# Patient Record
Sex: Female | Born: 1955
Health system: Southern US, Community
[De-identification: ages and names within clinical notes are randomized; demographics above are authoritative.]

## PROBLEM LIST (undated history)

## (undated) DIAGNOSIS — M1990 Unspecified osteoarthritis, unspecified site: Secondary | ICD-10-CM

## (undated) DIAGNOSIS — D649 Anemia, unspecified: Secondary | ICD-10-CM

## (undated) DIAGNOSIS — J45909 Unspecified asthma, uncomplicated: Secondary | ICD-10-CM

## (undated) DIAGNOSIS — F329 Major depressive disorder, single episode, unspecified: Secondary | ICD-10-CM

## (undated) DIAGNOSIS — Z972 Presence of dental prosthetic device (complete) (partial): Secondary | ICD-10-CM

## (undated) DIAGNOSIS — F419 Anxiety disorder, unspecified: Secondary | ICD-10-CM

## (undated) DIAGNOSIS — F32A Depression, unspecified: Secondary | ICD-10-CM

## (undated) DIAGNOSIS — H269 Unspecified cataract: Secondary | ICD-10-CM

## (undated) HISTORY — DX: Unspecified asthma, uncomplicated: J45.909

## (undated) HISTORY — PX: ABDOMINAL HYSTERECTOMY: SHX81

## (undated) HISTORY — DX: Depression, unspecified: F32.A

## (undated) HISTORY — PX: CATARACT EXTRACTION, BILATERAL: SHX1313

## (undated) HISTORY — DX: Anxiety disorder, unspecified: F41.9

## (undated) HISTORY — DX: Unspecified cataract: H26.9

## (undated) HISTORY — PX: APPENDECTOMY: SHX54

## (undated) HISTORY — PX: CHOLECYSTECTOMY: SHX55

## (undated) HISTORY — DX: Unspecified osteoarthritis, unspecified site: M19.90

## (undated) HISTORY — DX: Anemia, unspecified: D64.9

## (undated) HISTORY — DX: Major depressive disorder, single episode, unspecified: F32.9

---

## 2005-01-31 ENCOUNTER — Ambulatory Visit: Payer: Self-pay | Admitting: Family Medicine

## 2005-12-13 ENCOUNTER — Emergency Department: Payer: Self-pay | Admitting: Emergency Medicine

## 2006-03-16 ENCOUNTER — Ambulatory Visit: Payer: Self-pay | Admitting: Family Medicine

## 2006-04-05 ENCOUNTER — Ambulatory Visit: Payer: Self-pay | Admitting: Gastroenterology

## 2007-09-03 ENCOUNTER — Emergency Department: Payer: Self-pay | Admitting: Emergency Medicine

## 2008-08-26 DIAGNOSIS — F32A Depression, unspecified: Secondary | ICD-10-CM | POA: Insufficient documentation

## 2008-08-26 DIAGNOSIS — F419 Anxiety disorder, unspecified: Secondary | ICD-10-CM | POA: Insufficient documentation

## 2008-11-05 DIAGNOSIS — G43109 Migraine with aura, not intractable, without status migrainosus: Secondary | ICD-10-CM | POA: Insufficient documentation

## 2008-11-05 DIAGNOSIS — R634 Abnormal weight loss: Secondary | ICD-10-CM | POA: Insufficient documentation

## 2008-11-05 DIAGNOSIS — D649 Anemia, unspecified: Secondary | ICD-10-CM | POA: Insufficient documentation

## 2008-11-05 DIAGNOSIS — N951 Menopausal and female climacteric states: Secondary | ICD-10-CM | POA: Insufficient documentation

## 2011-03-17 DIAGNOSIS — B079 Viral wart, unspecified: Secondary | ICD-10-CM | POA: Diagnosis not present

## 2011-03-17 DIAGNOSIS — L989 Disorder of the skin and subcutaneous tissue, unspecified: Secondary | ICD-10-CM | POA: Diagnosis not present

## 2011-04-12 DIAGNOSIS — Z79899 Other long term (current) drug therapy: Secondary | ICD-10-CM | POA: Diagnosis not present

## 2011-05-16 DIAGNOSIS — G43109 Migraine with aura, not intractable, without status migrainosus: Secondary | ICD-10-CM | POA: Diagnosis not present

## 2011-05-16 DIAGNOSIS — F411 Generalized anxiety disorder: Secondary | ICD-10-CM | POA: Diagnosis not present

## 2011-05-16 DIAGNOSIS — L738 Other specified follicular disorders: Secondary | ICD-10-CM | POA: Diagnosis not present

## 2011-05-16 DIAGNOSIS — D649 Anemia, unspecified: Secondary | ICD-10-CM | POA: Diagnosis not present

## 2011-06-22 DIAGNOSIS — M722 Plantar fascial fibromatosis: Secondary | ICD-10-CM | POA: Diagnosis not present

## 2011-06-22 DIAGNOSIS — M7150 Other bursitis, not elsewhere classified, unspecified site: Secondary | ICD-10-CM | POA: Diagnosis not present

## 2011-07-11 DIAGNOSIS — Z79899 Other long term (current) drug therapy: Secondary | ICD-10-CM | POA: Diagnosis not present

## 2012-02-16 DIAGNOSIS — H25019 Cortical age-related cataract, unspecified eye: Secondary | ICD-10-CM | POA: Diagnosis not present

## 2012-03-26 DIAGNOSIS — F329 Major depressive disorder, single episode, unspecified: Secondary | ICD-10-CM | POA: Diagnosis not present

## 2012-03-26 DIAGNOSIS — D649 Anemia, unspecified: Secondary | ICD-10-CM | POA: Diagnosis not present

## 2012-03-26 DIAGNOSIS — N898 Other specified noninflammatory disorders of vagina: Secondary | ICD-10-CM | POA: Diagnosis not present

## 2012-03-26 DIAGNOSIS — Z0000 Encounter for general adult medical examination without abnormal findings: Secondary | ICD-10-CM | POA: Diagnosis not present

## 2012-03-26 DIAGNOSIS — R319 Hematuria, unspecified: Secondary | ICD-10-CM | POA: Diagnosis not present

## 2012-03-27 DIAGNOSIS — F329 Major depressive disorder, single episode, unspecified: Secondary | ICD-10-CM | POA: Diagnosis not present

## 2012-03-27 DIAGNOSIS — D649 Anemia, unspecified: Secondary | ICD-10-CM | POA: Diagnosis not present

## 2012-03-27 DIAGNOSIS — Z0000 Encounter for general adult medical examination without abnormal findings: Secondary | ICD-10-CM | POA: Diagnosis not present

## 2012-03-27 DIAGNOSIS — Z136 Encounter for screening for cardiovascular disorders: Secondary | ICD-10-CM | POA: Diagnosis not present

## 2012-04-12 DIAGNOSIS — D649 Anemia, unspecified: Secondary | ICD-10-CM | POA: Diagnosis not present

## 2012-04-16 DIAGNOSIS — N9089 Other specified noninflammatory disorders of vulva and perineum: Secondary | ICD-10-CM | POA: Diagnosis not present

## 2012-05-14 ENCOUNTER — Ambulatory Visit: Payer: Self-pay | Admitting: Family Medicine

## 2012-05-14 DIAGNOSIS — Z1231 Encounter for screening mammogram for malignant neoplasm of breast: Secondary | ICD-10-CM | POA: Diagnosis not present

## 2012-05-17 DIAGNOSIS — D649 Anemia, unspecified: Secondary | ICD-10-CM | POA: Diagnosis not present

## 2012-05-23 DIAGNOSIS — F329 Major depressive disorder, single episode, unspecified: Secondary | ICD-10-CM | POA: Diagnosis not present

## 2012-05-23 DIAGNOSIS — D649 Anemia, unspecified: Secondary | ICD-10-CM | POA: Diagnosis not present

## 2012-05-23 DIAGNOSIS — N898 Other specified noninflammatory disorders of vagina: Secondary | ICD-10-CM | POA: Diagnosis not present

## 2012-05-23 DIAGNOSIS — Z0000 Encounter for general adult medical examination without abnormal findings: Secondary | ICD-10-CM | POA: Diagnosis not present

## 2012-06-08 DIAGNOSIS — F332 Major depressive disorder, recurrent severe without psychotic features: Secondary | ICD-10-CM | POA: Diagnosis not present

## 2012-06-08 DIAGNOSIS — F4310 Post-traumatic stress disorder, unspecified: Secondary | ICD-10-CM | POA: Diagnosis not present

## 2012-09-04 DIAGNOSIS — F4310 Post-traumatic stress disorder, unspecified: Secondary | ICD-10-CM | POA: Diagnosis not present

## 2012-09-04 DIAGNOSIS — Z79899 Other long term (current) drug therapy: Secondary | ICD-10-CM | POA: Diagnosis not present

## 2012-09-04 DIAGNOSIS — F319 Bipolar disorder, unspecified: Secondary | ICD-10-CM | POA: Diagnosis not present

## 2012-12-18 DIAGNOSIS — F319 Bipolar disorder, unspecified: Secondary | ICD-10-CM | POA: Diagnosis not present

## 2012-12-18 DIAGNOSIS — F4310 Post-traumatic stress disorder, unspecified: Secondary | ICD-10-CM | POA: Diagnosis not present

## 2013-02-25 DIAGNOSIS — F4310 Post-traumatic stress disorder, unspecified: Secondary | ICD-10-CM | POA: Diagnosis not present

## 2013-04-12 DIAGNOSIS — N951 Menopausal and female climacteric states: Secondary | ICD-10-CM | POA: Diagnosis not present

## 2013-04-12 DIAGNOSIS — D649 Anemia, unspecified: Secondary | ICD-10-CM | POA: Diagnosis not present

## 2013-04-12 DIAGNOSIS — Z0000 Encounter for general adult medical examination without abnormal findings: Secondary | ICD-10-CM | POA: Diagnosis not present

## 2013-04-12 DIAGNOSIS — D239 Other benign neoplasm of skin, unspecified: Secondary | ICD-10-CM | POA: Diagnosis not present

## 2013-04-12 DIAGNOSIS — G4700 Insomnia, unspecified: Secondary | ICD-10-CM | POA: Diagnosis not present

## 2013-05-15 ENCOUNTER — Ambulatory Visit: Payer: Self-pay | Admitting: Family Medicine

## 2013-05-15 DIAGNOSIS — D485 Neoplasm of uncertain behavior of skin: Secondary | ICD-10-CM | POA: Diagnosis not present

## 2013-05-15 DIAGNOSIS — L820 Inflamed seborrheic keratosis: Secondary | ICD-10-CM | POA: Diagnosis not present

## 2013-05-15 DIAGNOSIS — D2360 Other benign neoplasm of skin of unspecified upper limb, including shoulder: Secondary | ICD-10-CM | POA: Diagnosis not present

## 2013-05-15 DIAGNOSIS — Z1231 Encounter for screening mammogram for malignant neoplasm of breast: Secondary | ICD-10-CM | POA: Diagnosis not present

## 2013-06-17 DIAGNOSIS — F4310 Post-traumatic stress disorder, unspecified: Secondary | ICD-10-CM | POA: Diagnosis not present

## 2013-06-17 DIAGNOSIS — F332 Major depressive disorder, recurrent severe without psychotic features: Secondary | ICD-10-CM | POA: Diagnosis not present

## 2013-07-01 DIAGNOSIS — M109 Gout, unspecified: Secondary | ICD-10-CM | POA: Diagnosis not present

## 2013-08-20 DIAGNOSIS — B354 Tinea corporis: Secondary | ICD-10-CM | POA: Diagnosis not present

## 2013-10-17 DIAGNOSIS — M79609 Pain in unspecified limb: Secondary | ICD-10-CM | POA: Diagnosis not present

## 2013-10-17 DIAGNOSIS — IMO0002 Reserved for concepts with insufficient information to code with codable children: Secondary | ICD-10-CM | POA: Diagnosis not present

## 2013-10-17 DIAGNOSIS — M5116 Intervertebral disc disorders with radiculopathy, lumbar region: Secondary | ICD-10-CM | POA: Insufficient documentation

## 2013-12-06 DIAGNOSIS — J069 Acute upper respiratory infection, unspecified: Secondary | ICD-10-CM | POA: Diagnosis not present

## 2014-01-13 DIAGNOSIS — F4310 Post-traumatic stress disorder, unspecified: Secondary | ICD-10-CM | POA: Diagnosis not present

## 2014-03-31 DIAGNOSIS — J019 Acute sinusitis, unspecified: Secondary | ICD-10-CM | POA: Diagnosis not present

## 2014-03-31 DIAGNOSIS — J40 Bronchitis, not specified as acute or chronic: Secondary | ICD-10-CM | POA: Diagnosis not present

## 2014-04-04 DIAGNOSIS — F4310 Post-traumatic stress disorder, unspecified: Secondary | ICD-10-CM | POA: Diagnosis not present

## 2014-04-04 DIAGNOSIS — F419 Anxiety disorder, unspecified: Secondary | ICD-10-CM | POA: Diagnosis not present

## 2014-04-04 DIAGNOSIS — F332 Major depressive disorder, recurrent severe without psychotic features: Secondary | ICD-10-CM | POA: Diagnosis not present

## 2014-04-28 DIAGNOSIS — Z0000 Encounter for general adult medical examination without abnormal findings: Secondary | ICD-10-CM | POA: Diagnosis not present

## 2014-04-28 DIAGNOSIS — M1990 Unspecified osteoarthritis, unspecified site: Secondary | ICD-10-CM | POA: Diagnosis not present

## 2014-04-28 DIAGNOSIS — R252 Cramp and spasm: Secondary | ICD-10-CM | POA: Diagnosis not present

## 2014-04-29 DIAGNOSIS — R252 Cramp and spasm: Secondary | ICD-10-CM | POA: Diagnosis not present

## 2014-04-29 DIAGNOSIS — Z0000 Encounter for general adult medical examination without abnormal findings: Secondary | ICD-10-CM | POA: Diagnosis not present

## 2014-04-29 DIAGNOSIS — F419 Anxiety disorder, unspecified: Secondary | ICD-10-CM | POA: Diagnosis not present

## 2014-04-29 LAB — BASIC METABOLIC PANEL
BUN: 15 mg/dL (ref 4–21)
CREATININE: 0.9 mg/dL (ref 0.5–1.1)
Glucose: 83 mg/dL
POTASSIUM: 4.4 mmol/L (ref 3.4–5.3)
SODIUM: 139 mmol/L (ref 137–147)

## 2014-04-29 LAB — HEPATIC FUNCTION PANEL
ALT: 10 U/L (ref 7–35)
AST: 19 U/L (ref 13–35)

## 2014-04-29 LAB — CBC AND DIFFERENTIAL
HCT: 34 % — AB (ref 36–46)
Hemoglobin: 11.1 g/dL — AB (ref 12.0–16.0)
PLATELETS: 325 10*3/uL (ref 150–399)
WBC: 6.4 10^3/mL

## 2014-04-29 LAB — LIPID PANEL
CHOLESTEROL: 182 mg/dL (ref 0–200)
HDL: 68 mg/dL (ref 35–70)
LDL Cholesterol: 104 mg/dL
Triglycerides: 49 mg/dL (ref 40–160)

## 2014-04-29 LAB — TSH: TSH: 0.73 u[IU]/mL (ref 0.41–5.90)

## 2014-05-19 ENCOUNTER — Ambulatory Visit: Payer: Self-pay | Admitting: Family Medicine

## 2014-05-19 DIAGNOSIS — Z1231 Encounter for screening mammogram for malignant neoplasm of breast: Secondary | ICD-10-CM | POA: Diagnosis not present

## 2014-05-28 DIAGNOSIS — M5431 Sciatica, right side: Secondary | ICD-10-CM | POA: Diagnosis not present

## 2014-06-23 DIAGNOSIS — F4310 Post-traumatic stress disorder, unspecified: Secondary | ICD-10-CM | POA: Diagnosis not present

## 2014-08-08 ENCOUNTER — Other Ambulatory Visit: Payer: Self-pay | Admitting: Family Medicine

## 2014-08-08 MED ORDER — HYDROCODONE-ACETAMINOPHEN 10-325 MG PO TABS: 1.0000 | ORAL_TABLET | Freq: Four times a day (QID) | ORAL | Status: DC | PRN

## 2014-08-08 NOTE — Telephone Encounter (Signed)
Prescription printed. Please notify patient it is ready for pick up. Thanks- Dr. Trinka Keshishyan.  

## 2014-08-08 NOTE — Telephone Encounter (Signed)
Pt contacted office for refill request on the following medications: Hydrocodone 10-325 mg. Thanks TNP  °

## 2014-08-08 NOTE — Telephone Encounter (Signed)
Left message advising pt.   Thanks,   -Laura  

## 2014-08-11 ENCOUNTER — Other Ambulatory Visit: Payer: Self-pay | Admitting: Family Medicine

## 2014-08-11 MED ORDER — HYDROCODONE-ACETAMINOPHEN 10-325 MG PO TABS: 1.0000 | ORAL_TABLET | Freq: Four times a day (QID) | ORAL | Status: DC | PRN

## 2014-09-22 DIAGNOSIS — F4310 Post-traumatic stress disorder, unspecified: Secondary | ICD-10-CM | POA: Diagnosis not present

## 2014-10-06 DIAGNOSIS — M7670 Peroneal tendinitis, unspecified leg: Secondary | ICD-10-CM | POA: Insufficient documentation

## 2014-10-06 DIAGNOSIS — M7671 Peroneal tendinitis, right leg: Secondary | ICD-10-CM | POA: Diagnosis not present

## 2014-11-13 ENCOUNTER — Ambulatory Visit (INDEPENDENT_AMBULATORY_CARE_PROVIDER_SITE_OTHER): Payer: Medicare Other | Admitting: Family Medicine

## 2014-11-13 ENCOUNTER — Encounter: Payer: Self-pay | Admitting: Family Medicine

## 2014-11-13 VITALS — BP 106/66 | HR 56 | Temp 98.7°F | Resp 16 | Ht 65.0 in | Wt 146.0 lb

## 2014-11-13 DIAGNOSIS — R252 Cramp and spasm: Secondary | ICD-10-CM | POA: Insufficient documentation

## 2014-11-13 DIAGNOSIS — M1990 Unspecified osteoarthritis, unspecified site: Secondary | ICD-10-CM | POA: Insufficient documentation

## 2014-11-13 DIAGNOSIS — T63441A Toxic effect of venom of bees, accidental (unintentional), initial encounter: Secondary | ICD-10-CM | POA: Insufficient documentation

## 2014-11-13 DIAGNOSIS — J45909 Unspecified asthma, uncomplicated: Secondary | ICD-10-CM | POA: Insufficient documentation

## 2014-11-13 DIAGNOSIS — L739 Follicular disorder, unspecified: Secondary | ICD-10-CM | POA: Insufficient documentation

## 2014-11-13 DIAGNOSIS — D229 Melanocytic nevi, unspecified: Secondary | ICD-10-CM | POA: Insufficient documentation

## 2014-11-13 DIAGNOSIS — R319 Hematuria, unspecified: Secondary | ICD-10-CM | POA: Insufficient documentation

## 2014-11-13 MED ORDER — CETIRIZINE HCL 10 MG PO TABS: 10.0000 mg | ORAL_TABLET | Freq: Every day | ORAL | Status: DC

## 2014-11-13 MED ORDER — RANITIDINE HCL 150 MG PO CAPS: 150.0000 mg | ORAL_CAPSULE | Freq: Two times a day (BID) | ORAL | Status: DC

## 2014-11-13 NOTE — Progress Notes (Signed)
        Patient: Patricia Decker Female    DOB: 06-Apr-1955   59 y.o.   MRN: [id number] Visit Date: 11/13/2014  Today's Provider: Margarita Rana, MD   Chief Complaint  Patient presents with  . Insect Bite   Subjective:    HPI Rash: Patient complains of rash and swelling involving the right arm. Rash started today . Appearance of rash at onset: Other appearance: swollen.  Discomfort associated with rash: is painful.  Associated symptoms: none. Denies: shortness of breath. Patient reports bee sting today around 1 pm. Patient reports that she has taken a dose of benadryl no improvement for her swelling.   Has always had an allergy to bees. Has not been stung in a while.  Does not hurt unless she touches it.       Allergies  Allergen Reactions  . Penicillins   . Sulfa Antibiotics     Indigestion   Previous Medications   ALPRAZOLAM (XANAX) 1 MG TABLET    Take 1 tablet by mouth 3 (three) times daily.   ARIPIPRAZOLE PO    Take 1 tablet by mouth daily.    ESTRADIOL (ESTRACE) 1 MG TABLET    Take 1 tablet by mouth daily.   HYDROCODONE-ACETAMINOPHEN (NORCO) 10-325 MG PER TABLET    Take 1 tablet by mouth every 6 (six) hours as needed. To be filled after 10/11/2014   VENLAFAXINE XR (EFFEXOR XR) 150 MG 24 HR CAPSULE    Take 2 capsules by mouth daily.     Review of Systems  Constitutional: Negative.   HENT: Negative.   Respiratory: Negative.   Skin: Rash: swelling.    Social History  Substance Use Topics  . Smoking status: Never Smoker   . Smokeless tobacco: Never Used  . Alcohol Use: No   Objective:   BP 106/66 mmHg  Pulse 56  Temp(Src) 98.7 F (37.1 C) (Oral)  Resp 16  Ht 5\' 5"  (1.651 m)  Wt 146 lb (66.225 kg)  BMI 24.30 kg/m2  SpO2 99%  Physical Exam  Constitutional: She is oriented to person, place, and time. She appears well-developed and well-nourished.  Musculoskeletal: She exhibits edema.  Marked edema in right hand from thumb to MCPs and over dorsal surface of hand.  Tender to deep palpation. Slightly decreased ability to make fist secondary to swelling.    Neurological: She is alert and oriented to person, place, and time.  Skin: Skin is warm and dry.      Assessment & Plan:     1. Bee sting reaction, accidental or unintentional, initial encounter Stabilized since taking Benadryl. Will add medication as noted. Patient instructed to call back if condition worsens or does not improve.    - cetirizine (ZYRTEC) 10 MG tablet; Take 1 tablet (10 mg total) by mouth daily.  Dispense: 30 tablet; Refill: 11 - ranitidine (ZANTAC) 150 MG capsule; Take 1 capsule (150 mg total) by mouth 2 (two) times daily.  Dispense: 60 capsule; Refill: 0  Margarita Rana, MD       Margarita Rana, MD  [facility]

## 2014-11-28 ENCOUNTER — Other Ambulatory Visit: Payer: Self-pay | Admitting: Family Medicine

## 2014-11-28 DIAGNOSIS — M1990 Unspecified osteoarthritis, unspecified site: Secondary | ICD-10-CM

## 2014-11-28 MED ORDER — HYDROCODONE-ACETAMINOPHEN 10-325 MG PO TABS: 1.0000 | ORAL_TABLET | Freq: Four times a day (QID) | ORAL | Status: DC | PRN

## 2014-11-28 NOTE — Telephone Encounter (Signed)
Pt contacted office for refill request on the following medications: HYDROcodone-acetaminophen (NORCO) 10-325 MG per tablet. Pt stated she has 4 left and she usually gets 3 scripts at a time. Pt stated she would like to get this Monday 12/01/14. Thanks TNP

## 2014-12-30 ENCOUNTER — Other Ambulatory Visit: Payer: Self-pay

## 2014-12-30 DIAGNOSIS — M1990 Unspecified osteoarthritis, unspecified site: Secondary | ICD-10-CM

## 2014-12-30 MED ORDER — HYDROCODONE-ACETAMINOPHEN 10-325 MG PO TABS: 1.0000 | ORAL_TABLET | Freq: Four times a day (QID) | ORAL | Status: DC | PRN

## 2014-12-30 NOTE — Telephone Encounter (Signed)
Pt called and states she needs refill for norco that she only got 1 RX last time and she has 4 tablets left. Thanks-aa

## 2014-12-30 NOTE — Telephone Encounter (Signed)
Prescription printed. Please notify patient it is ready for pick up. Thanks- Dr. Arvine Clayburn.  

## 2015-01-19 DIAGNOSIS — F4310 Post-traumatic stress disorder, unspecified: Secondary | ICD-10-CM | POA: Diagnosis not present

## 2015-01-30 ENCOUNTER — Other Ambulatory Visit: Payer: Self-pay | Admitting: Family Medicine

## 2015-01-30 DIAGNOSIS — M1990 Unspecified osteoarthritis, unspecified site: Secondary | ICD-10-CM

## 2015-01-30 NOTE — Telephone Encounter (Signed)
Pt called for refill on her HYDROcodone-acetaminophen (NORCO) 10-325 MG tablet  Call back [phone]   Thanks teri

## 2015-02-02 ENCOUNTER — Other Ambulatory Visit: Payer: Self-pay | Admitting: Family Medicine

## 2015-02-02 DIAGNOSIS — M1990 Unspecified osteoarthritis, unspecified site: Secondary | ICD-10-CM

## 2015-02-02 MED ORDER — HYDROCODONE-ACETAMINOPHEN 10-325 MG PO TABS: 1.0000 | ORAL_TABLET | Freq: Four times a day (QID) | ORAL | Status: DC | PRN

## 2015-02-25 ENCOUNTER — Other Ambulatory Visit: Payer: Self-pay | Admitting: Family Medicine

## 2015-02-25 DIAGNOSIS — M1990 Unspecified osteoarthritis, unspecified site: Secondary | ICD-10-CM

## 2015-02-25 NOTE — Telephone Encounter (Signed)
Prescription printed. Please notify patient it is ready for pick up. Thanks- Dr. Jkayla Spiewak.  

## 2015-03-31 ENCOUNTER — Other Ambulatory Visit: Payer: Self-pay | Admitting: Family Medicine

## 2015-03-31 DIAGNOSIS — M1990 Unspecified osteoarthritis, unspecified site: Secondary | ICD-10-CM

## 2015-03-31 MED ORDER — HYDROCODONE-ACETAMINOPHEN 10-325 MG PO TABS: 1.0000 | ORAL_TABLET | Freq: Four times a day (QID) | ORAL | Status: DC | PRN

## 2015-03-31 NOTE — Telephone Encounter (Signed)
Pt contacted office for refill request on the following medications:  HYDROcodone-acetaminophen (NORCO) 10-325 MG tablet.  CB#[phone]/MW °

## 2015-05-01 ENCOUNTER — Other Ambulatory Visit: Payer: Self-pay | Admitting: Family Medicine

## 2015-05-01 DIAGNOSIS — M1990 Unspecified osteoarthritis, unspecified site: Secondary | ICD-10-CM

## 2015-05-01 MED ORDER — HYDROCODONE-ACETAMINOPHEN 10-325 MG PO TABS: 1.0000 | ORAL_TABLET | Freq: Four times a day (QID) | ORAL | Status: DC | PRN

## 2015-05-01 NOTE — Telephone Encounter (Signed)
Prescription printed. Please notify patient it is ready for pick up.  Make sure keeps appointment in April.  Thanks- Dr. Venia Minks.

## 2015-05-01 NOTE — Telephone Encounter (Signed)
.  Pt contacted office for refill request on the following medications:  HYDROcodone-acetaminophen (NORCO) 10-325 MG tablet.  CB#[phone]/MW

## 2015-05-05 ENCOUNTER — Encounter: Payer: Self-pay | Admitting: Family Medicine

## 2015-06-01 ENCOUNTER — Other Ambulatory Visit: Payer: Self-pay | Admitting: Family Medicine

## 2015-06-01 DIAGNOSIS — M1990 Unspecified osteoarthritis, unspecified site: Secondary | ICD-10-CM

## 2015-06-01 MED ORDER — HYDROCODONE-ACETAMINOPHEN 10-325 MG PO TABS: 1.0000 | ORAL_TABLET | Freq: Four times a day (QID) | ORAL | Status: DC | PRN

## 2015-06-01 NOTE — Telephone Encounter (Signed)
Pt contacted office for refill request on the following medications:  HYDROcodone-acetaminophen (NORCO) 10-325 MG tablet.  CB#[phone]/MW   This is a Dr Venia Minks pt.

## 2015-06-01 NOTE — Telephone Encounter (Signed)
I am not in office this week. Need provider in office to fill. Thanks.

## 2015-06-02 NOTE — Telephone Encounter (Signed)
Is this okay for you to fill? Renaldo Fiddler, CMA

## 2015-06-11 ENCOUNTER — Ambulatory Visit (INDEPENDENT_AMBULATORY_CARE_PROVIDER_SITE_OTHER): Payer: Medicare Other | Admitting: Family Medicine

## 2015-06-11 ENCOUNTER — Encounter: Payer: Self-pay | Admitting: Family Medicine

## 2015-06-11 VITALS — BP 140/80 | HR 66 | Temp 98.1°F | Resp 16 | Ht 65.0 in | Wt 142.0 lb

## 2015-06-11 DIAGNOSIS — Z124 Encounter for screening for malignant neoplasm of cervix: Secondary | ICD-10-CM

## 2015-06-11 DIAGNOSIS — Z0000 Encounter for general adult medical examination without abnormal findings: Secondary | ICD-10-CM | POA: Diagnosis not present

## 2015-06-11 DIAGNOSIS — Z1231 Encounter for screening mammogram for malignant neoplasm of breast: Secondary | ICD-10-CM | POA: Diagnosis not present

## 2015-06-11 DIAGNOSIS — R634 Abnormal weight loss: Secondary | ICD-10-CM

## 2015-06-11 DIAGNOSIS — F329 Major depressive disorder, single episode, unspecified: Secondary | ICD-10-CM | POA: Diagnosis not present

## 2015-06-11 DIAGNOSIS — D508 Other iron deficiency anemias: Secondary | ICD-10-CM

## 2015-06-11 DIAGNOSIS — F32A Depression, unspecified: Secondary | ICD-10-CM

## 2015-06-11 DIAGNOSIS — F419 Anxiety disorder, unspecified: Secondary | ICD-10-CM | POA: Diagnosis not present

## 2015-06-11 DIAGNOSIS — Z1211 Encounter for screening for malignant neoplasm of colon: Secondary | ICD-10-CM

## 2015-06-11 LAB — IFOBT (OCCULT BLOOD): IFOBT: NEGATIVE

## 2015-06-11 NOTE — Progress Notes (Signed)
Patient ID: [id number] HRUBES, female   DOB: [birth year], 60 y.o.   MRN: [id number]       Patient: Patricia Decker, Female    DOB: [birth year], 60 y.o.   MRN: [id number] Visit Date: 06/11/2015  Today's Provider: Margarita Rana, MD   Chief Complaint  Patient presents with  . Medicare Wellness   Subjective:    Annual wellness visit Patricia Decker is a 60 y.o. female. She feels well. She reports exercising active with daily activities. She reports she is sleeping fairly well.  04/28/14 CPE 05/19/14 Mammogram-BI-RADS 1 04/05/06 Colonoscopy-normal  -----------------------------------------------------------   Review of Systems  Constitutional: Negative.   Eyes: Negative.   Respiratory: Negative.   Cardiovascular: Negative.   Gastrointestinal: Negative.   Endocrine: Negative.   Genitourinary: Negative.   Musculoskeletal: Positive for arthralgias.  Skin: Negative.   Allergic/Immunologic: Positive for environmental allergies.  Neurological: Positive for headaches.  Hematological: Negative.   Psychiatric/Behavioral: The patient is nervous/anxious.     Social History   Social History  . Marital Status: Widowed    Spouse Name: N/A  . Number of Children: N/A  . Years of Education: N/A   Occupational History  . Not on file.   Social History Main Topics  . Smoking status: Never Smoker   . Smokeless tobacco: Never Used  . Alcohol Use: No  . Drug Use: No  . Sexual Activity: Not on file   Other Topics Concern  . Not on file   Social History Narrative    Past Medical History  Diagnosis Date  . Anxiety   . Depression   . Arthritis   . Asthma   . Anemia      Patient Active Problem List   Diagnosis Date Noted  . Arthritis 11/13/2014  . Airway hyperreactivity 11/13/2014  . Atypical nevus 11/13/2014  . Folliculitis [id number]  . Blood in the urine 11/13/2014  . Cramp in muscle 11/13/2014  . Bee sting reaction 11/13/2014  . Neuritis or radiculitis due to rupture  of lumbar intervertebral disc 10/17/2013  . Abnormal loss of weight 11/05/2008  . Absolute anemia 11/05/2008  . Menopausal symptom 11/05/2008  . Migraine aura without headache 11/05/2008  . Anxiety 08/26/2008  . Clinical depression 08/26/2008    Past Surgical History  Procedure Laterality Date  . Cholecystectomy    . Abdominal hysterectomy    . Appendectomy      Her family history includes Alzheimer's disease in her sister.    Previous Medications   ALPRAZOLAM (XANAX) 1 MG TABLET    Take 1 tablet by mouth 3 (three) times daily.   ARIPIPRAZOLE PO    Take 1 tablet by mouth daily.    ESTRADIOL (ESTRACE) 1 MG TABLET    Take 1 tablet by mouth daily.   HYDROCODONE-ACETAMINOPHEN (NORCO) 10-325 MG TABLET    Take 1 tablet by mouth every 6 (six) hours as needed.   VENLAFAXINE XR (EFFEXOR XR) 150 MG 24 HR CAPSULE    Take 2 capsules by mouth daily.     Patient Care Team: Margarita Rana, MD as PCP - General (Family Medicine)     Objective:   Vitals: BP 140/80 mmHg  Pulse 66  Temp(Src) 98.1 F (36.7 C) (Oral)  Resp 16  Ht 5\' 5"  (1.651 m)  Wt 142 lb (64.411 kg)  BMI 23.63 kg/m2  SpO2 99%  Physical Exam  Constitutional: She is oriented to person, place, and time. She appears well-developed and well-nourished.  HENT:  Head: Normocephalic  and atraumatic.  Right Ear: Tympanic membrane, external ear and ear canal normal.  Left Ear: Tympanic membrane, external ear and ear canal normal.  Nose: Nose normal.  Mouth/Throat: Uvula is midline, oropharynx is clear and moist and mucous membranes are normal.  Eyes: Conjunctivae, EOM and lids are normal. Pupils are equal, round, and reactive to light.  Neck: Trachea normal and normal range of motion. Neck supple. Carotid bruit is not present. No thyroid mass and no thyromegaly present.  Cardiovascular: Normal rate, regular rhythm and normal heart sounds.   Pulmonary/Chest: Effort normal and breath sounds normal.  Abdominal: Soft. Normal  appearance and bowel sounds are normal. There is no hepatosplenomegaly. There is no tenderness.  Genitourinary: Rectum normal and vagina normal. No breast swelling, tenderness or discharge.  Musculoskeletal: Normal range of motion.  Lymphadenopathy:    She has no cervical adenopathy.    She has no axillary adenopathy.  Neurological: She is alert and oriented to person, place, and time. She has normal strength. No cranial nerve deficit.  Skin: Skin is warm, dry and intact.  Psychiatric: She has a normal mood and affect. Her speech is normal and behavior is normal. Judgment and thought content normal. Cognition and memory are normal.    Activities of Daily Living In your present state of health, do you have any difficulty performing the following activities: 06/11/2015  Hearing? N  Vision? N  Difficulty concentrating or making decisions? N  Walking or climbing stairs? N  Dressing or bathing? N  Doing errands, shopping? N    Fall Risk Assessment Fall Risk  06/11/2015  Falls in the past year? No     Depression Screen PHQ 2/9 Scores 06/11/2015  PHQ - 2 Score 1    Cognitive Testing - 6-CIT  Correct? Score   What year is it? yes 0 0 or 4  What month is it? yes 0 0 or 3  Memorize:    Pia Mau,  44,  [address]  [place],      What time is it? (within 1 hour) yes 0 0 or 3  Count backwards from 20 yes 0 0, 2, or 4  Name the months of the year yes 0 0, 2, or 4  Repeat name & address above no 2 0, 2, 4, 6, 8, or 10       TOTAL SCORE  2/28   Interpretation:  Normal  Normal (0-7) Abnormal (8-28)       Assessment & Plan:     Annual Wellness Visit  Reviewed patient's Family Medical History Reviewed and updated list of patient's medical providers Assessment of cognitive impairment was done Assessed patient's functional ability Established a written schedule for health screening [place] Completed and Reviewed  Exercise Activities and Dietary  recommendations Goals    None       There is no immunization history on file for this patient.      1. Medicare annual wellness visit, subsequent Stable. Patient advised to continue eating healthy and exercise daily.  2. Cervical cancer screening F/U pending lab report. - Pap IG (Image Guided)  3. Colon cancer screening negative - IFOBT POC (occult bld, rslt in office)  4. Encounter for screening mammogram for breast cancer - MM DIGITAL SCREENING BILATERAL; Future  5. Other iron deficiency anemias - CBC with Differential/Platelet  6. Anxiety - TSH  7. Abnormal loss of weight - Comprehensive metabolic panel   Patient seen and examined by Dr. Candiss Norse.Marland Kitchen  Venia Minks, and note scribed by Philbert Riser. Dimas, CMA.  I have reviewed the document for accuracy and completeness and I agree with above. Jerrell Belfast, MD   Margarita Rana, MD   ------------------------------------------------------------------------------------------------------------

## 2015-06-12 DIAGNOSIS — F419 Anxiety disorder, unspecified: Secondary | ICD-10-CM | POA: Diagnosis not present

## 2015-06-12 DIAGNOSIS — R634 Abnormal weight loss: Secondary | ICD-10-CM | POA: Diagnosis not present

## 2015-06-12 DIAGNOSIS — D508 Other iron deficiency anemias: Secondary | ICD-10-CM | POA: Diagnosis not present

## 2015-06-13 LAB — CBC WITH DIFFERENTIAL/PLATELET
BASOS ABS: 0 10*3/uL (ref 0.0–0.2)
Basos: 1 %
EOS (ABSOLUTE): 0.1 10*3/uL (ref 0.0–0.4)
EOS: 2 %
HEMATOCRIT: 34.2 % (ref 34.0–46.6)
HEMOGLOBIN: 11 g/dL — AB (ref 11.1–15.9)
IMMATURE GRANULOCYTES: 0 %
Immature Grans (Abs): 0 10*3/uL (ref 0.0–0.1)
LYMPHS ABS: 2.8 10*3/uL (ref 0.7–3.1)
LYMPHS: 43 %
MCH: 27.9 pg (ref 26.6–33.0)
MCHC: 32.2 g/dL (ref 31.5–35.7)
MCV: 87 fL (ref 79–97)
MONOCYTES: 7 %
Monocytes Absolute: 0.4 10*3/uL (ref 0.1–0.9)
NEUTROS PCT: 47 %
Neutrophils Absolute: 3.1 10*3/uL (ref 1.4–7.0)
Platelets: 352 10*3/uL (ref 150–379)
RBC: 3.94 x10E6/uL (ref 3.77–5.28)
RDW: 15.5 % — AB (ref 12.3–15.4)
WBC: 6.4 10*3/uL (ref 3.4–10.8)

## 2015-06-13 LAB — COMPREHENSIVE METABOLIC PANEL
ALBUMIN: 4.1 g/dL (ref 3.5–5.5)
ALK PHOS: 61 IU/L (ref 39–117)
ALT: 8 IU/L (ref 0–32)
AST: 14 IU/L (ref 0–40)
Albumin/Globulin Ratio: 1.7 (ref 1.2–2.2)
BUN/Creatinine Ratio: 16 (ref 9–23)
BUN: 12 mg/dL (ref 6–24)
Bilirubin Total: <0.2 mg/dL (ref 0.0–1.2)
CALCIUM: 9.2 mg/dL (ref 8.7–10.2)
CO2: 25 mmol/L (ref 18–29)
CREATININE: 0.74 mg/dL (ref 0.57–1.00)
Chloride: 104 mmol/L (ref 96–106)
GFR calc Af Amer: 103 mL/min/{1.73_m2} (ref >59)
GFR, EST NON AFRICAN AMERICAN: 90 mL/min/{1.73_m2} (ref >59)
GLOBULIN, TOTAL: 2.4 g/dL (ref 1.5–4.5)
GLUCOSE: 94 mg/dL (ref 65–99)
Potassium: 4.7 mmol/L (ref 3.5–5.2)
Sodium: 144 mmol/L (ref 134–144)
Total Protein: 6.5 g/dL (ref 6.0–8.5)

## 2015-06-13 LAB — TSH: TSH: 0.889 u[IU]/mL (ref 0.450–4.500)

## 2015-06-15 ENCOUNTER — Telehealth: Payer: Self-pay

## 2015-06-15 NOTE — Telephone Encounter (Signed)
-----   Message from Margarita Rana, MD sent at 06/13/2015  2:13 PM EDT ----- Labs stable. Please notify patient. Thanks.

## 2015-06-15 NOTE — Telephone Encounter (Signed)
Patient advised as below.  

## 2015-06-17 ENCOUNTER — Telehealth: Payer: Self-pay

## 2015-06-17 LAB — PAP IG (IMAGE GUIDED): PAP SMEAR COMMENT: 0

## 2015-06-17 NOTE — Telephone Encounter (Signed)
LMTCB Javonta Gronau Drozdowski, CMA  

## 2015-06-17 NOTE — Telephone Encounter (Signed)
-----   Message from Margarita Rana, MD sent at 06/17/2015 10:13 AM EDT ----- Pap is normal. Please notify patient.   Thanks.

## 2015-06-17 NOTE — Telephone Encounter (Signed)
Advised pt of lab results. Pt verbally acknowledges understanding. Moorea Boissonneault Drozdowski, CMA   

## 2015-06-24 ENCOUNTER — Ambulatory Visit
Admission: RE | Admit: 2015-06-24 | Discharge: 2015-06-24 | Disposition: A | Discharge status: Home / Self Care | Payer: Medicare Other | Source: Ambulatory Visit | Attending: Family Medicine | Admitting: Family Medicine

## 2015-06-24 ENCOUNTER — Other Ambulatory Visit: Payer: Self-pay | Admitting: Family Medicine

## 2015-06-24 DIAGNOSIS — Z1231 Encounter for screening mammogram for malignant neoplasm of breast: Secondary | ICD-10-CM | POA: Insufficient documentation

## 2015-06-29 ENCOUNTER — Other Ambulatory Visit: Payer: Self-pay | Admitting: Family Medicine

## 2015-06-29 DIAGNOSIS — M1990 Unspecified osteoarthritis, unspecified site: Secondary | ICD-10-CM

## 2015-06-29 MED ORDER — HYDROCODONE-ACETAMINOPHEN 10-325 MG PO TABS: 1.0000 | ORAL_TABLET | Freq: Four times a day (QID) | ORAL | Status: DC | PRN

## 2015-06-29 NOTE — Telephone Encounter (Signed)
Prescription printed. Please notify patient it is ready for pick up. Thanks- Dr. Marcell Pfeifer.  

## 2015-06-29 NOTE — Telephone Encounter (Signed)
Pt contacted office for refill request on the following medications:  HYDROcodone-acetaminophen (NORCO) 10-325 MG tablet.  CB#[phone]/MW °

## 2015-07-28 ENCOUNTER — Other Ambulatory Visit: Payer: Self-pay | Admitting: Family Medicine

## 2015-07-28 DIAGNOSIS — M1990 Unspecified osteoarthritis, unspecified site: Secondary | ICD-10-CM

## 2015-07-28 MED ORDER — HYDROCODONE-ACETAMINOPHEN 10-325 MG PO TABS: 1.0000 | ORAL_TABLET | Freq: Four times a day (QID) | ORAL | Status: DC | PRN

## 2015-07-28 NOTE — Telephone Encounter (Signed)
Prescription printed. Please notify patient it is ready for pick up. Thanks- Dr. Kishana Battey.  

## 2015-07-28 NOTE — Telephone Encounter (Signed)
Pt needs refill HYDROcodone-acetaminophen (NORCO) 10-325 MG tablet   Her call back is [phone]  Thanks Con Memos

## 2015-08-27 ENCOUNTER — Other Ambulatory Visit: Payer: Self-pay | Admitting: Physician Assistant

## 2015-08-27 DIAGNOSIS — N951 Menopausal and female climacteric states: Secondary | ICD-10-CM

## 2015-08-27 DIAGNOSIS — M1990 Unspecified osteoarthritis, unspecified site: Secondary | ICD-10-CM

## 2015-08-27 MED ORDER — HYDROCODONE-ACETAMINOPHEN 10-325 MG PO TABS: 1.0000 | ORAL_TABLET | Freq: Four times a day (QID) | ORAL | Status: DC | PRN

## 2015-08-27 MED ORDER — ESTRADIOL 1 MG PO TABS: 1.0000 mg | ORAL_TABLET | Freq: Every day | ORAL | Status: DC

## 2015-08-27 NOTE — Telephone Encounter (Signed)
Prescription printed. Please notify patient it is ready for pick up. Need follow with new provider for next rx.  Thanks- Dr. Venia Minks.

## 2015-08-27 NOTE — Telephone Encounter (Signed)
Pt requesting a refill of HYDROcodone-acetaminophen (NORCO) 10-325 MG tablet and estradiol (ESTRACE) 1 MG tablet can be called into [facility].

## 2015-08-31 ENCOUNTER — Telehealth: Payer: Self-pay | Admitting: Physician Assistant

## 2015-09-02 NOTE — Telephone Encounter (Signed)
No message needed °

## 2015-09-25 ENCOUNTER — Other Ambulatory Visit: Payer: Self-pay | Admitting: Family Medicine

## 2015-09-25 DIAGNOSIS — M1990 Unspecified osteoarthritis, unspecified site: Secondary | ICD-10-CM

## 2015-09-25 MED ORDER — HYDROCODONE-ACETAMINOPHEN 10-325 MG PO TABS: 1.0000 | ORAL_TABLET | Freq: Three times a day (TID) | ORAL | Status: DC | PRN

## 2015-09-25 NOTE — Telephone Encounter (Signed)
Pt contacted office for refill request on the following medications: ° °HYDROcodone-acetaminophen (NORCO) 10-325 MG tablet  ° °CB#[phone] or [phone]/MW °

## 2015-10-02 DIAGNOSIS — L304 Erythema intertrigo: Secondary | ICD-10-CM | POA: Diagnosis not present

## 2015-10-02 DIAGNOSIS — L308 Other specified dermatitis: Secondary | ICD-10-CM | POA: Diagnosis not present

## 2015-10-26 ENCOUNTER — Other Ambulatory Visit: Payer: Self-pay | Admitting: Physician Assistant

## 2015-10-26 ENCOUNTER — Telehealth: Payer: Self-pay

## 2015-10-26 DIAGNOSIS — M1990 Unspecified osteoarthritis, unspecified site: Secondary | ICD-10-CM

## 2015-10-26 MED ORDER — HYDROCODONE-ACETAMINOPHEN 10-325 MG PO TABS: 1.0000 | ORAL_TABLET | Freq: Three times a day (TID) | ORAL | 0 refills | Status: DC | PRN

## 2015-10-26 NOTE — Telephone Encounter (Signed)
Pt contacted office for refill request on the following medications: HYDROcodone-acetaminophen (NORCO) 10-325 MG tablet. Last written: 09/25/15 Last OV: 06/11/15 Please advise. Thanks TNP

## 2015-10-26 NOTE — Telephone Encounter (Signed)
Patient advised that RX for HYDROcodone-acetaminophen (NORCO) 10-325 MG tablet  Is up front ready for pick up.  Thanks,  -Dolora Ridgely

## 2015-11-26 ENCOUNTER — Other Ambulatory Visit: Payer: Self-pay | Admitting: Physician Assistant

## 2015-11-26 DIAGNOSIS — M1990 Unspecified osteoarthritis, unspecified site: Secondary | ICD-10-CM

## 2015-11-26 MED ORDER — HYDROCODONE-ACETAMINOPHEN 10-325 MG PO TABS: 1.0000 | ORAL_TABLET | Freq: Three times a day (TID) | ORAL | 0 refills | Status: DC | PRN

## 2015-11-26 NOTE — Telephone Encounter (Signed)
Pt contacted office for refill request on the following medications:  HYDROcodone-acetaminophen (NORCO) 10-325 MG tablet.  CB#[phone]/MW

## 2015-11-26 NOTE — Telephone Encounter (Signed)
Please review. Thanks!  

## 2015-11-27 NOTE — Telephone Encounter (Signed)
Tawanna Sat, did you print off Rx for patient? I cant find anywhere. Pharmacy reports that I cant call it in. Thanks!

## 2015-11-27 NOTE — Telephone Encounter (Signed)
Yes it was printed and should have been placed up front yesterday for her to pick up. I think I gave it to [facility].

## 2015-12-15 DIAGNOSIS — M7061 Trochanteric bursitis, right hip: Secondary | ICD-10-CM | POA: Diagnosis not present

## 2015-12-15 DIAGNOSIS — M545 Low back pain: Secondary | ICD-10-CM | POA: Diagnosis not present

## 2015-12-23 ENCOUNTER — Telehealth: Payer: Self-pay | Admitting: Physician Assistant

## 2015-12-23 DIAGNOSIS — M1990 Unspecified osteoarthritis, unspecified site: Secondary | ICD-10-CM

## 2015-12-23 MED ORDER — HYDROCODONE-ACETAMINOPHEN 10-325 MG PO TABS: 1.0000 | ORAL_TABLET | Freq: Three times a day (TID) | ORAL | 0 refills | Status: DC | PRN

## 2015-12-23 NOTE — Telephone Encounter (Signed)
Pt contacted office for refill request on the following medications:  HYDROcodone-acetaminophen (NORCO) 10-325 MG tablet.  CB#[phone]/MW

## 2015-12-23 NOTE — Telephone Encounter (Signed)
Last ov 06/11/15

## 2015-12-23 NOTE — Telephone Encounter (Signed)
Medication printed and will be placed up front for pick up.

## 2016-01-21 ENCOUNTER — Other Ambulatory Visit: Payer: Self-pay | Admitting: Physician Assistant

## 2016-01-21 DIAGNOSIS — M1990 Unspecified osteoarthritis, unspecified site: Secondary | ICD-10-CM

## 2016-01-21 MED ORDER — HYDROCODONE-ACETAMINOPHEN 10-325 MG PO TABS: 1.0000 | ORAL_TABLET | Freq: Three times a day (TID) | ORAL | 0 refills | Status: DC | PRN

## 2016-01-21 NOTE — Telephone Encounter (Signed)
Pt contacted office for refill request on the following medications: ° °HYDROcodone-acetaminophen (NORCO) 10-325 MG tablet  ° °CB#[phone]/MW °

## 2016-01-21 NOTE — Telephone Encounter (Signed)
Medication: NORCO Last Written: 12/23/15  Thanks,  -Joseline

## 2016-01-22 NOTE — Telephone Encounter (Signed)
LM RX up front ready for pick up.  Thanks,  -Joseline

## 2016-02-02 DIAGNOSIS — F4310 Post-traumatic stress disorder, unspecified: Secondary | ICD-10-CM | POA: Diagnosis not present

## 2016-02-02 DIAGNOSIS — Z79899 Other long term (current) drug therapy: Secondary | ICD-10-CM | POA: Diagnosis not present

## 2016-02-19 ENCOUNTER — Other Ambulatory Visit: Payer: Self-pay

## 2016-02-19 DIAGNOSIS — M1990 Unspecified osteoarthritis, unspecified site: Secondary | ICD-10-CM

## 2016-02-19 MED ORDER — HYDROCODONE-ACETAMINOPHEN 10-325 MG PO TABS: 1.0000 | ORAL_TABLET | Freq: Three times a day (TID) | ORAL | 0 refills | Status: DC | PRN

## 2016-02-19 NOTE — Telephone Encounter (Signed)
Please review-aa 

## 2016-02-19 NOTE — Telephone Encounter (Signed)
Rx printed and will be placed up front for pick up

## 2016-02-19 NOTE — Telephone Encounter (Signed)
Pt advised-aa 

## 2016-02-19 NOTE — Telephone Encounter (Signed)
Patient requesting refill on medication. She reports that she will pick it up next week. Thanks!

## 2016-03-21 ENCOUNTER — Encounter: Payer: Self-pay | Admitting: Physician Assistant

## 2016-03-21 ENCOUNTER — Ambulatory Visit (INDEPENDENT_AMBULATORY_CARE_PROVIDER_SITE_OTHER): Payer: Medicare Other | Admitting: Physician Assistant

## 2016-03-21 VITALS — BP 140/70 | HR 94 | Temp 98.2°F | Resp 16 | Wt 136.8 lb

## 2016-03-21 DIAGNOSIS — M7061 Trochanteric bursitis, right hip: Secondary | ICD-10-CM | POA: Diagnosis not present

## 2016-03-21 DIAGNOSIS — M1990 Unspecified osteoarthritis, unspecified site: Secondary | ICD-10-CM

## 2016-03-21 DIAGNOSIS — J40 Bronchitis, not specified as acute or chronic: Secondary | ICD-10-CM | POA: Diagnosis not present

## 2016-03-21 MED ORDER — HYDROCODONE-ACETAMINOPHEN 10-325 MG PO TABS: 1.0000 | ORAL_TABLET | Freq: Three times a day (TID) | ORAL | 0 refills | Status: DC | PRN

## 2016-03-21 MED ORDER — PREDNISONE 10 MG (21) PO TBPK: ORAL_TABLET | ORAL | 0 refills | Status: DC

## 2016-03-21 MED ORDER — BENZONATATE 100 MG PO CAPS
100.0000 mg | ORAL_CAPSULE | Freq: Three times a day (TID) | ORAL | 0 refills | Status: DC | PRN
Start: 2016-03-21 — End: 2016-06-13

## 2016-03-21 NOTE — Progress Notes (Signed)
Patient: Patricia Decker Female    DOB: [birth year]   61 y.o.   MRN: [id number] Visit Date: 03/21/2016  Today's Provider: Mar Daring, PA-C   Chief Complaint  Patient presents with  . Cough   Subjective:    Cough  This is a new problem. The current episode started in the past 7 days (Started Friday). The problem has been gradually worsening. The problem occurs constantly. The cough is productive of sputum. Associated symptoms include headaches, nasal congestion, postnasal drip and rhinorrhea. Pertinent negatives include no chills, ear congestion, ear pain, fever, sore throat, shortness of breath or wheezing. Chest pain: chest hurts from coughing. She has tried OTC cough suppressant (Tussin-DM) for the symptoms. The treatment provided no relief.      Allergies  Allergen Reactions  . Penicillins   . Sulfa Antibiotics     Indigestion     Current Outpatient Prescriptions:  .  ALPRAZolam (XANAX) 1 MG tablet, Take 1 tablet by mouth 3 (three) times daily., Disp: , Rfl:  .  ARIPIPRAZOLE PO, Take 1 tablet by mouth daily. , Disp: , Rfl:  .  estradiol (ESTRACE) 1 MG tablet, Take 1 tablet (1 mg total) by mouth daily., Disp: 90 tablet, Rfl: 1 .  HYDROcodone-acetaminophen (NORCO) 10-325 MG tablet, Take 1 tablet by mouth every 8 (eight) hours as needed for severe pain., Disp: 60 tablet, Rfl: 0 .  venlafaxine XR (EFFEXOR XR) 150 MG 24 hr capsule, Take 2 capsules by mouth daily. , Disp: , Rfl:   Review of Systems  Constitutional: Negative for chills and fever.  HENT: Positive for congestion, postnasal drip, rhinorrhea and sinus pressure. Negative for ear pain, nosebleeds and sore throat.   Respiratory: Positive for cough and chest tightness. Negative for shortness of breath and wheezing.   Cardiovascular: Negative for palpitations and leg swelling. Chest pain: chest hurts from coughing.  Gastrointestinal: Negative for abdominal pain, diarrhea, nausea and vomiting.  Neurological:  Positive for headaches. Negative for dizziness.    Social History  Substance Use Topics  . Smoking status: Never Smoker  . Smokeless tobacco: Never Used  . Alcohol use No   Objective:   BP 140/70 (BP Location: Right Arm, Patient Position: Sitting, Cuff Size: Normal)   Pulse 94   Temp 98.2 F (36.8 C) (Oral)   Resp 16   Wt 136 lb 12.8 oz (62.1 kg)   SpO2 99%   BMI 22.76 kg/m   Physical Exam  Constitutional: She appears well-developed and well-nourished. No distress.  HENT:  Head: Normocephalic and atraumatic.  Right Ear: Hearing, tympanic membrane, external ear and ear canal normal.  Left Ear: Hearing, tympanic membrane, external ear and ear canal normal.  Nose: Nose normal.  Mouth/Throat: Uvula is midline, oropharynx is clear and moist and mucous membranes are normal. No oropharyngeal exudate.  Eyes: Conjunctivae are normal. Pupils are equal, round, and reactive to light. Right eye exhibits no discharge. Left eye exhibits no discharge. No scleral icterus.  Neck: Normal range of motion. Neck supple. No tracheal deviation present. No thyromegaly present.  Cardiovascular: Normal rate, regular rhythm and normal heart sounds.  Exam reveals no gallop and no friction rub.   No murmur heard. Pulmonary/Chest: Effort normal. No stridor. No respiratory distress. She has wheezes (scattered, mild throughout). She has no rales.  Lymphadenopathy:    She has no cervical adenopathy.  Skin: Skin is warm and dry. She is not diaphoretic.  Vitals reviewed.  Assessment & Plan:     1. Bronchitis Most likely viral and patient is improving. I will add prednisone as below for inflammation and irritation of the bronchials. I've also given Tessalon Perles as below for cough. She may continue Mucinex DM for congestion. She is to call the office if symptoms fail to improve or worsen. - predniSONE (STERAPRED UNI-PAK 21 TAB) 10 MG (21) TBPK tablet; Take as directed on package directions  Dispense:  21 tablet; Refill: 0 - benzonatate (TESSALON) 100 MG capsule; Take 1 capsule (100 mg total) by mouth 3 (three) times daily as needed for cough.  Dispense: 30 capsule; Refill: 0  2. Arthritis Stable. Diagnosis pulled for medication refill. Continue current medical treatment plan. - HYDROcodone-acetaminophen (NORCO) 10-325 MG tablet; Take 1 tablet by mouth every 8 (eight) hours as needed for severe pain.  Dispense: 60 tablet; Refill: 0  3. Trochanteric bursitis of right hip Stable. Followed by Dr. Sabra Heck. Diagnosis pulled for medication refill. Continue current medical treatment plan. - meloxicam (MOBIC) 15 MG tablet; Take 1 tablet (15 mg total) by mouth daily.  Dispense: 30 tablet; Refill: 0       Mar Daring, PA-C  [facility]

## 2016-03-21 NOTE — Patient Instructions (Signed)

## 2016-04-21 ENCOUNTER — Other Ambulatory Visit: Payer: Self-pay | Admitting: Physician Assistant

## 2016-04-21 DIAGNOSIS — M1990 Unspecified osteoarthritis, unspecified site: Secondary | ICD-10-CM

## 2016-04-21 MED ORDER — HYDROCODONE-ACETAMINOPHEN 10-325 MG PO TABS: 1.0000 | ORAL_TABLET | Freq: Three times a day (TID) | ORAL | 0 refills | Status: DC | PRN

## 2016-04-21 NOTE — Telephone Encounter (Signed)
Pt needs refill on her   HYDROcodone-acetaminophen (NORCO) 10-325 MG tablet  Please call when ready  Thanks teri

## 2016-04-21 NOTE — Telephone Encounter (Signed)
LMTCB-KW 

## 2016-04-21 NOTE — Telephone Encounter (Signed)
Patient was advised and states that she will pick up prescription tomorrow. KW

## 2016-04-21 NOTE — Telephone Encounter (Signed)
Rx printed. [place] substance abuse registry checked. Patient has not filled early, does not pharmacy shop ([facility] has filled all), and has only been prescribed by me.

## 2016-05-18 ENCOUNTER — Other Ambulatory Visit: Payer: Self-pay | Admitting: Physician Assistant

## 2016-05-18 DIAGNOSIS — M1990 Unspecified osteoarthritis, unspecified site: Secondary | ICD-10-CM

## 2016-05-18 MED ORDER — HYDROCODONE-ACETAMINOPHEN 10-325 MG PO TABS: 1.0000 | ORAL_TABLET | Freq: Three times a day (TID) | ORAL | 0 refills | Status: DC | PRN

## 2016-05-18 NOTE — Telephone Encounter (Signed)
Pt needs refill on her   HYDROcodone-acetaminophen (NORCO) 10-325 MG tablet  Thanks teri

## 2016-05-18 NOTE — Telephone Encounter (Signed)
Patient advised RX is ready for pick up.  

## 2016-05-18 NOTE — Telephone Encounter (Signed)
Rx printed. [place] substance registry reviewed. Patient has never filled early, only has filled by myself, and has only been filled at [facility]. Patient will require a urine drug screen in the near future per new narcotic guidelines.

## 2016-06-13 ENCOUNTER — Ambulatory Visit (INDEPENDENT_AMBULATORY_CARE_PROVIDER_SITE_OTHER): Payer: Medicare Other | Admitting: Physician Assistant

## 2016-06-13 ENCOUNTER — Encounter: Payer: Self-pay | Admitting: Physician Assistant

## 2016-06-13 VITALS — BP 120/80 | HR 64 | Temp 98.2°F | Resp 16 | Ht 65.0 in | Wt 137.0 lb

## 2016-06-13 DIAGNOSIS — Z0000 Encounter for general adult medical examination without abnormal findings: Secondary | ICD-10-CM | POA: Diagnosis not present

## 2016-06-13 DIAGNOSIS — M1990 Unspecified osteoarthritis, unspecified site: Secondary | ICD-10-CM | POA: Diagnosis not present

## 2016-06-13 DIAGNOSIS — Z1211 Encounter for screening for malignant neoplasm of colon: Secondary | ICD-10-CM

## 2016-06-13 DIAGNOSIS — N951 Menopausal and female climacteric states: Secondary | ICD-10-CM

## 2016-06-13 DIAGNOSIS — Z1231 Encounter for screening mammogram for malignant neoplasm of breast: Secondary | ICD-10-CM | POA: Diagnosis not present

## 2016-06-13 DIAGNOSIS — D508 Other iron deficiency anemias: Secondary | ICD-10-CM | POA: Diagnosis not present

## 2016-06-13 DIAGNOSIS — Z1239 Encounter for other screening for malignant neoplasm of breast: Secondary | ICD-10-CM

## 2016-06-13 MED ORDER — HYDROCODONE-ACETAMINOPHEN 10-325 MG PO TABS: 1.0000 | ORAL_TABLET | Freq: Three times a day (TID) | ORAL | 0 refills | Status: DC | PRN

## 2016-06-13 MED ORDER — ESTRADIOL 1 MG PO TABS: 1.0000 mg | ORAL_TABLET | Freq: Every day | ORAL | 1 refills | Status: DC

## 2016-06-13 NOTE — Patient Instructions (Signed)
Health Maintenance for Postmenopausal Women Menopause is a normal process in which your reproductive ability comes to an end. This process happens gradually over a span of months to years, usually between the ages of 33 and 38. Menopause is complete when you have missed 12 consecutive menstrual periods. It is important to talk with your health care provider about some of the most common conditions that affect postmenopausal women, such as heart disease, cancer, and bone loss (osteoporosis). Adopting a healthy lifestyle and getting preventive care can help to promote your health and wellness. Those actions can also lower your chances of developing some of these common conditions. What should I know about menopause? During menopause, you may experience a number of symptoms, such as:  Moderate-to-severe hot flashes.  Night sweats.  Decrease in sex drive.  Mood swings.  Headaches.  Tiredness.  Irritability.  Memory problems.  Insomnia. Choosing to treat or not to treat menopausal changes is an individual decision that you make with your health care provider. What should I know about hormone replacement therapy and supplements? Hormone therapy products are effective for treating symptoms that are associated with menopause, such as hot flashes and night sweats. Hormone replacement carries certain risks, especially as you become older. If you are thinking about using estrogen or estrogen with progestin treatments, discuss the benefits and risks with your health care provider. What should I know about heart disease and stroke? Heart disease, heart attack, and stroke become more likely as you age. This may be due, in part, to the hormonal changes that your body experiences during menopause. These can affect how your body processes dietary fats, triglycerides, and cholesterol. Heart attack and stroke are both medical emergencies. There are many things that you can do to help prevent heart disease  and stroke:  Have your blood pressure checked at least every 1-2 years. High blood pressure causes heart disease and increases the risk of stroke.  If you are 48-61 years old, ask your health care provider if you should take aspirin to prevent a heart attack or a stroke.  Do not use any tobacco products, including cigarettes, chewing tobacco, or electronic cigarettes. If you need help quitting, ask your health care provider.  It is important to eat a healthy diet and maintain a healthy weight.  Be sure to include plenty of vegetables, fruits, low-fat dairy products, and lean protein.  Avoid eating foods that are high in solid fats, added sugars, or salt (sodium).  Get regular exercise. This is one of the most important things that you can do for your health.  Try to exercise for at least 150 minutes each week. The type of exercise that you do should increase your heart rate and make you sweat. This is known as moderate-intensity exercise.  Try to do strengthening exercises at least twice each week. Do these in addition to the moderate-intensity exercise.  Know your numbers.Ask your health care provider to check your cholesterol and your blood glucose. Continue to have your blood tested as directed by your health care provider. What should I know about cancer screening? There are several types of cancer. Take the following steps to reduce your risk and to catch any cancer development as early as possible. Breast Cancer  Practice breast self-awareness.  This means understanding how your breasts normally appear and feel.  It also means doing regular breast self-exams. Let your health care provider know about any changes, no matter how small.  If you are 40 or older,  have a clinician do a breast exam (clinical breast exam or CBE) every year. Depending on your age, family history, and medical history, it may be recommended that you also have a yearly breast X-ray (mammogram).  If you  have a family history of breast cancer, talk with your health care provider about genetic screening.  If you are at high risk for breast cancer, talk with your health care provider about having an MRI and a mammogram every year.  Breast cancer (BRCA) gene test is recommended for women who have family members with BRCA-related cancers. Results of the assessment will determine the need for genetic counseling and BRCA1 and for BRCA2 testing. BRCA-related cancers include these types:  Breast. This occurs in males or females.  Ovarian.  Tubal. This may also be called fallopian tube cancer.  Cancer of the abdominal or pelvic lining (peritoneal cancer).  Prostate.  Pancreatic. Cervical, Uterine, and Ovarian Cancer  Your health care provider may recommend that you be screened regularly for cancer of the pelvic organs. These include your ovaries, uterus, and vagina. This screening involves a pelvic exam, which includes checking for microscopic changes to the surface of your cervix (Pap test).  For women ages 21-65, health care providers may recommend a pelvic exam and a Pap test every three years. For women ages 23-65, they may recommend the Pap test and pelvic exam, combined with testing for human papilloma virus (HPV), every five years. Some types of HPV increase your risk of cervical cancer. Testing for HPV may also be done on women of any age who have unclear Pap test results.  Other health care providers may not recommend any screening for nonpregnant women who are considered low risk for pelvic cancer and have no symptoms. Ask your health care provider if a screening pelvic exam is right for you.  If you have had past treatment for cervical cancer or a condition that could lead to cancer, you need Pap tests and screening for cancer for at least 20 years after your treatment. If Pap tests have been discontinued for you, your risk factors (such as having a new sexual partner) need to be reassessed  to determine if you should start having screenings again. Some women have medical problems that increase the chance of getting cervical cancer. In these cases, your health care provider may recommend that you have screening and Pap tests more often.  If you have a family history of uterine cancer or ovarian cancer, talk with your health care provider about genetic screening.  If you have vaginal bleeding after reaching menopause, tell your health care provider.  There are currently no reliable tests available to screen for ovarian cancer. Lung Cancer  Lung cancer screening is recommended for adults 99-83 years old who are at high risk for lung cancer because of a history of smoking. A yearly low-dose CT scan of the lungs is recommended if you:  Currently smoke.  Have a history of at least 30 pack-years of smoking and you currently smoke or have quit within the past 15 years. A pack-year is smoking an average of one pack of cigarettes per day for one year. Yearly screening should:  Continue until it has been 15 years since you quit.  Stop if you develop a health problem that would prevent you from having lung cancer treatment. Colorectal Cancer  This type of cancer can be detected and can often be prevented.  Routine colorectal cancer screening usually begins at age 72 and continues  through age 75.  If you have risk factors for colon cancer, your health care provider may recommend that you be screened at an earlier age.  If you have a family history of colorectal cancer, talk with your health care provider about genetic screening.  Your health care provider may also recommend using home test kits to check for hidden blood in your stool.  A small camera at the end of a tube can be used to examine your colon directly (sigmoidoscopy or colonoscopy). This is done to check for the earliest forms of colorectal cancer.  Direct examination of the colon should be repeated every 5-10 years until  age 75. However, if early forms of precancerous polyps or small growths are found or if you have a family history or genetic risk for colorectal cancer, you may need to be screened more often. Skin Cancer  Check your skin from head to toe regularly.  Monitor any moles. Be sure to tell your health care provider:  About any new moles or changes in moles, especially if there is a change in a mole's shape or color.  If you have a mole that is larger than the size of a pencil eraser.  If any of your family members has a history of skin cancer, especially at a young age, talk with your health care provider about genetic screening.  Always use sunscreen. Apply sunscreen liberally and repeatedly throughout the day.  Whenever you are outside, protect yourself by wearing long sleeves, pants, a wide-brimmed hat, and sunglasses. What should I know about osteoporosis? Osteoporosis is a condition in which bone destruction happens more quickly than new bone creation. After menopause, you may be at an increased risk for osteoporosis. To help prevent osteoporosis or the bone fractures that can happen because of osteoporosis, the following is recommended:  If you are 19-50 years old, get at least 1,000 mg of calcium and at least 600 mg of vitamin D per day.  If you are older than age 50 but younger than age 70, get at least 1,200 mg of calcium and at least 600 mg of vitamin D per day.  If you are older than age 70, get at least 1,200 mg of calcium and at least 800 mg of vitamin D per day. Smoking and excessive alcohol intake increase the risk of osteoporosis. Eat foods that are rich in calcium and vitamin D, and do weight-bearing exercises several times each week as directed by your health care provider. What should I know about how menopause affects my mental health? Depression may occur at any age, but it is more common as you become older. Common symptoms of depression include:  Low or sad  mood.  Changes in sleep patterns.  Changes in appetite or eating patterns.  Feeling an overall lack of motivation or enjoyment of activities that you previously enjoyed.  Frequent crying spells. Talk with your health care provider if you think that you are experiencing depression. What should I know about immunizations? It is important that you get and maintain your immunizations. These include:  Tetanus, diphtheria, and pertussis (Tdap) booster vaccine.  Influenza every year before the flu season begins.  Pneumonia vaccine.  Shingles vaccine. Your health care provider may also recommend other immunizations. This information is not intended to replace advice given to you by your health care provider. Make sure you discuss any questions you have with your health care provider. Document Released: 04/15/2005 Document Revised: 09/11/2015 Document Reviewed: 11/25/2014 Elsevier Interactive Patient   Education  2017 [employer]  

## 2016-06-13 NOTE — Progress Notes (Signed)
Patient: Patricia Decker, Female    DOB: 1955-11-11, 61 y.o.   MRN: [id number] Visit Date: 06/13/2016  Today's Provider: Mar Daring, PA-C   Chief Complaint  Patient presents with  . Annual Exam   Subjective:    Annual physical exam Patricia Decker is a 61 y.o. female who presents today for health maintenance and complete physical. She feels well. She reports exercising daily. She reports she is sleeping well. 06/11/15 AWE 06/11/15 Pap-neg 06/24/15 Mammogram-BI-RADS 1 04/05/06 Colonoscopy-normal ----------------------------------------------------------------   Review of Systems  Constitutional: Positive for diaphoresis.  HENT: Negative.   Eyes: Negative.   Respiratory: Negative.   Cardiovascular: Negative.   Gastrointestinal: Negative.   Endocrine: Negative.   Genitourinary: Negative.   Musculoskeletal: Positive for arthralgias and back pain.  Skin: Negative.   Allergic/Immunologic: Negative.   Neurological: Positive for headaches.  Hematological: Negative.   Psychiatric/Behavioral: The patient is nervous/anxious.     Social History      She  reports that she has never smoked. She has never used smokeless tobacco. She reports that she does not drink alcohol or use drugs.       Social History   Social History  . Marital status: Widowed    Spouse name: N/A  . Number of children: N/A  . Years of education: N/A   Social History Main Topics  . Smoking status: Never Smoker  . Smokeless tobacco: Never Used  . Alcohol use No  . Drug use: No  . Sexual activity: Not Asked   Other Topics Concern  . None   Social History Narrative  . None    Past Medical History:  Diagnosis Date  . Anemia   . Anxiety   . Arthritis   . Asthma   . Depression      Patient Active Problem List   Diagnosis Date Noted  . Arthritis 11/13/2014  . Airway hyperreactivity 11/13/2014  . Atypical nevus 11/13/2014  . Folliculitis 09/32/6712  . Blood in the urine  11/13/2014  . Cramp in muscle 11/13/2014  . Bee sting reaction 11/13/2014  . Neuritis or radiculitis due to rupture of lumbar intervertebral disc 10/17/2013  . Abnormal loss of weight 11/05/2008  . Absolute anemia 11/05/2008  . Menopausal symptom 11/05/2008  . Migraine aura without headache 11/05/2008  . Anxiety 08/26/2008  . Clinical depression 08/26/2008    Past Surgical History:  Procedure Laterality Date  . ABDOMINAL HYSTERECTOMY    . APPENDECTOMY    . CHOLECYSTECTOMY      Family History        Family Status  Relation Status  . Sister Deceased at age 31's  . Mother Deceased at age 41   bone cancer  . Father Deceased at age 52   kidney failure  . Brother Alive  . Son Deceased at age 78  . Sister Alive  . Sister Alive  . Sister Alive  . Sister Alive  . Brother Alive  . Brother Alive  . Brother Alive  . Sister Alive        Her family history includes Alzheimer's disease in her sister; Breast cancer (age of onset: 77) in her sister.     Allergies  Allergen Reactions  . Penicillins   . Sulfa Antibiotics     Indigestion     Current Outpatient Prescriptions:  .  ALPRAZolam (XANAX) 1 MG tablet, Take 2 tablets by mouth 3 (three) times daily. , Disp: , Rfl:  .  ARIPIPRAZOLE PO, Take 1 tablet by mouth daily. , Disp: , Rfl:  .  venlafaxine XR (EFFEXOR XR) 150 MG 24 hr capsule, Take 2 capsules by mouth daily. , Disp: , Rfl:  .  estradiol (ESTRACE) 1 MG tablet, Take 1 tablet (1 mg total) by mouth daily. (Patient not taking: Reported on 06/13/2016), Disp: 90 tablet, Rfl: 1   Patient Care Team: Mar Daring, PA-C as PCP - General (Family Medicine)      Objective:   Vitals: BP 120/80 (BP Location: Left Arm, Patient Position: Sitting, Cuff Size: Large)   Pulse 64   Temp 98.2 F (36.8 C) (Oral)   Resp 16   Ht 5\' 5"  (1.651 m)   Wt 137 lb (62.1 kg)   SpO2 98%   BMI 22.80 kg/m    Vitals:   06/13/16 1006  BP: 120/80  Pulse: 64  Resp: 16  Temp: 98.2 F  (36.8 C)  TempSrc: Oral  SpO2: 98%  Weight: 137 lb (62.1 kg)  Height: 5\' 5"  (1.651 m)     Physical Exam  Constitutional: She is oriented to person, place, and time. She appears well-developed and well-nourished. No distress.  HENT:  Head: Normocephalic and atraumatic.  Right Ear: Hearing, tympanic membrane, external ear and ear canal normal.  Left Ear: Hearing, tympanic membrane, external ear and ear canal normal.  Nose: Nose normal.  Mouth/Throat: Uvula is midline, oropharynx is clear and moist and mucous membranes are normal. No oropharyngeal exudate.  Eyes: Conjunctivae and EOM are normal. Pupils are equal, round, and reactive to light. Right eye exhibits no discharge. Left eye exhibits no discharge. No scleral icterus.  Neck: Normal range of motion. Neck supple. No JVD present. Carotid bruit is not present. No tracheal deviation present. No thyromegaly present.  Cardiovascular: Normal rate, regular rhythm, normal heart sounds and intact distal pulses.  Exam reveals no gallop and no friction rub.   No murmur heard. Pulmonary/Chest: Effort normal and breath sounds normal. No respiratory distress. She has no wheezes. She has no rales. She exhibits no tenderness. Right breast exhibits no inverted nipple, no mass, no nipple discharge, no skin change and no tenderness. Left breast exhibits no inverted nipple, no mass, no nipple discharge, no skin change and no tenderness. Breasts are symmetrical.  Abdominal: Soft. Bowel sounds are normal. She exhibits no distension and no mass. There is no tenderness. There is no rebound and no guarding.  Musculoskeletal: Normal range of motion. She exhibits no edema or tenderness.  Lymphadenopathy:    She has no cervical adenopathy.  Neurological: She is alert and oriented to person, place, and time.  Skin: Skin is warm and dry. No rash noted. She is not diaphoretic.  Psychiatric: She has a normal mood and affect. Her behavior is normal. Judgment and  thought content normal.  Vitals reviewed.   Cognitive Testing - 6-CIT  Correct? Score   What year is it? yes 0 0 or 4  What month is it? yes 0 0 or 3  Memorize:    Pia Mau,  [birth year],  [place],      What time is it? (within 1 hour) yes 0 0 or 3  Count backwards from 20 yes 0 0, 2, or 4  Name the months of the year yes 0 0, 2, or 4  Repeat name & address above no 6 0, 2, 4, 6, 8, or 10       TOTAL SCORE  6/28  Interpretation:  Normal  Normal (0-7) Abnormal (8-28)     Depression Screen PHQ 2/9 Scores 06/13/2016 06/11/2015  PHQ - 2 Score 2 1  PHQ- 9 Score 6 -   Audit-C Alcohol Use Screening  Question Answer Points  How often do you have alcoholic drink? never 0  On days you do drink alcohol, how many drinks do you typically consume? 0 0  How oftey will you drink 6 or more in a total? never 0  Total Score:  0   A score of 3 or more in women, and 4 or more in men indicates increased risk for alcohol abuse, EXCEPT if all of the points are from question 1.    Assessment & Plan:     Routine Health Maintenance and Physical Exam  Exercise Activities and Dietary recommendations Goals    None       There is no immunization history on file for this patient.  Health Maintenance  Topic Date Due  . TETANUS/TDAP  05/10/1976  . COLONOSCOPY  04/05/2016  . INFLUENZA VACCINE  10/05/2016  . MAMMOGRAM  06/23/2017  . PAP SMEAR  06/11/2018  . Hepatitis C Screening  Completed  . HIV Screening  Completed     Discussed health benefits of physical activity, and encouraged her to engage in regular exercise appropriate for her age and condition.    1. Medicare annual wellness visit, subsequent Normal exam for age. Patient refuses vaccinations today. Due for Td booster at this time.   2. Menopausal symptom Continued hot flash. Diagnosis pulled for medication refill. Continue current medical treatment plan. Will check labs as below and f/u pending results. - CBC  w/Diff/Platelet - Comprehensive Metabolic Panel (CMET) - estradiol (ESTRACE) 1 MG tablet; Take 1 tablet (1 mg total) by mouth daily.  Dispense: 90 tablet; Refill: 1  3. Other iron deficiency anemia H/O this. Not currently on iron supplementation. Will check labs as below and f/u pending results. - CBC w/Diff/Platelet - Comprehensive Metabolic Panel (CMET)  4. Breast cancer screening Breast exam today was normal. There is no family history of breast cancer. She does perform regular self breast exams. Mammogram was ordered as below. Information for [facility] was given to patient so she may schedule her mammogram at her convenience. - MM Digital Screening; Future  5. Colon cancer screening Due for colonoscopy. Referral placed.  - Ambulatory referral to Gastroenterology  6. Arthritis Stable. Diagnosis pulled for medication refill. Continue current medical treatment plan. - HYDROcodone-acetaminophen (NORCO) 10-325 MG tablet; Take 1 tablet by mouth every 8 (eight) hours as needed for severe pain. Please do NOT fill until 06/19/16  Dispense: 60 tablet; Refill: 0  --------------------------------------------------------------------    Mar Daring, PA-C  [facility]

## 2016-06-14 ENCOUNTER — Telehealth: Payer: Self-pay

## 2016-06-14 LAB — CBC WITH DIFFERENTIAL/PLATELET
BASOS ABS: 0 10*3/uL (ref 0.0–0.2)
BASOS: 1 %
EOS (ABSOLUTE): 0.1 10*3/uL (ref 0.0–0.4)
Eos: 2 %
HEMATOCRIT: 35 % (ref 34.0–46.6)
HEMOGLOBIN: 11.1 g/dL (ref 11.1–15.9)
IMMATURE GRANS (ABS): 0 10*3/uL (ref 0.0–0.1)
Immature Granulocytes: 0 %
LYMPHS ABS: 3.5 10*3/uL — AB (ref 0.7–3.1)
LYMPHS: 50 %
MCH: 27.5 pg (ref 26.6–33.0)
MCHC: 31.7 g/dL (ref 31.5–35.7)
MCV: 87 fL (ref 79–97)
MONOCYTES: 7 %
Monocytes Absolute: 0.5 10*3/uL (ref 0.1–0.9)
NEUTROS ABS: 2.8 10*3/uL (ref 1.4–7.0)
Neutrophils: 40 %
Platelets: 304 10*3/uL (ref 150–379)
RBC: 4.03 x10E6/uL (ref 3.77–5.28)
RDW: 15.6 % — ABNORMAL HIGH (ref 12.3–15.4)
WBC: 6.9 10*3/uL (ref 3.4–10.8)

## 2016-06-14 LAB — COMPREHENSIVE METABOLIC PANEL
A/G RATIO: 1.6 (ref 1.2–2.2)
ALBUMIN: 4.2 g/dL (ref 3.5–5.5)
ALK PHOS: 58 IU/L (ref 39–117)
ALT: 9 IU/L (ref 0–32)
AST: 16 IU/L (ref 0–40)
BUN / CREAT RATIO: 23 (ref 9–23)
BUN: 16 mg/dL (ref 6–24)
CHLORIDE: 100 mmol/L (ref 96–106)
CO2: 27 mmol/L (ref 18–29)
Calcium: 9.2 mg/dL (ref 8.7–10.2)
Creatinine, Ser: 0.69 mg/dL (ref 0.57–1.00)
GFR calc non Af Amer: 96 mL/min/{1.73_m2} (ref >59)
GFR, EST AFRICAN AMERICAN: 110 mL/min/{1.73_m2} (ref >59)
GLOBULIN, TOTAL: 2.7 g/dL (ref 1.5–4.5)
Glucose: 71 mg/dL (ref 65–99)
POTASSIUM: 4.3 mmol/L (ref 3.5–5.2)
SODIUM: 143 mmol/L (ref 134–144)
TOTAL PROTEIN: 6.9 g/dL (ref 6.0–8.5)

## 2016-06-14 NOTE — Telephone Encounter (Signed)
-----   Message from Mar Daring, [place] sent at 06/14/2016 11:16 AM EDT ----- All labs are within normal limits and stable.  Thanks! -JB

## 2016-06-14 NOTE — Telephone Encounter (Signed)
Patient advised as directed below.  Thanks,  -Miquel Stacks 

## 2016-06-14 NOTE — Telephone Encounter (Signed)
LMTCB  Thanks,  -Joyceann Kruser 

## 2016-06-17 ENCOUNTER — Telehealth: Payer: Self-pay

## 2016-06-17 ENCOUNTER — Other Ambulatory Visit: Payer: Self-pay

## 2016-06-17 DIAGNOSIS — Z1211 Encounter for screening for malignant neoplasm of colon: Secondary | ICD-10-CM

## 2016-06-17 NOTE — Telephone Encounter (Signed)
Gastroenterology Pre-Procedure Review  Request Date: 4/26 Requesting Physician: Dr. Vicente Males  PATIENT REVIEW QUESTIONS: The patient responded to the following health history questions as indicated:    1. Are you having any GI issues? no 2. Do you have a personal history of Polyps? no 3. Do you have a family history of Colon Cancer or Polyps? no 4. Diabetes Mellitus? no 5. Joint replacements in the past 12 months?no 6. Major health problems in the past 3 months?no 7. Any artificial heart valves, MVP, or defibrillator?no    MEDICATIONS & ALLERGIES:    Patient reports the following regarding taking any anticoagulation/antiplatelet therapy:   Plavix, Coumadin, Eliquis, Xarelto, Lovenox, Pradaxa, Brilinta, or Effient? no Aspirin? no  Patient confirms/reports the following medications:  Current Outpatient Prescriptions  Medication Sig Dispense Refill  . ALPRAZolam (XANAX) 1 MG tablet Take 2 tablets by mouth 3 (three) times daily.     . ARIPIPRAZOLE PO Take 1 tablet by mouth daily.     Marland Kitchen estradiol (ESTRACE) 1 MG tablet Take 1 tablet (1 mg total) by mouth daily. 90 tablet 1  . HYDROcodone-acetaminophen (NORCO) 10-325 MG tablet Take 1 tablet by mouth every 8 (eight) hours as needed for severe pain. Please do NOT fill until 06/19/16 60 tablet 0  . venlafaxine XR (EFFEXOR XR) 150 MG 24 hr capsule Take 2 capsules by mouth daily.      No current facility-administered medications for this visit.     Patient confirms/reports the following allergies:  Allergies  Allergen Reactions  . Penicillins   . Sulfa Antibiotics     Indigestion    No orders of the defined types were placed in this encounter.   AUTHORIZATION INFORMATION Primary Insurance: 1D#: Group #:  Secondary Insurance: 1D#: Group #:  SCHEDULE INFORMATION: Date: 4/26 Time: Location: [employer]

## 2016-06-27 ENCOUNTER — Telehealth: Payer: Self-pay | Admitting: Gastroenterology

## 2016-06-27 NOTE — Telephone Encounter (Signed)
06/27/16 Spoke with Hall Busing at [place] and NO prior auth required when [facility] is primary.

## 2016-06-30 ENCOUNTER — Ambulatory Visit
Admission: RE | Admit: 2016-06-30 | Discharge: 2016-06-30 | Disposition: A | Discharge status: Home / Self Care | Payer: Medicare Other | Source: Ambulatory Visit | Attending: Gastroenterology | Admitting: Gastroenterology

## 2016-06-30 ENCOUNTER — Ambulatory Visit: Payer: Medicare Other | Admitting: Anesthesiology

## 2016-06-30 ENCOUNTER — Encounter
Admission: RE | Disposition: A | Discharge status: Home / Self Care | Payer: Self-pay | Source: Ambulatory Visit | Attending: Gastroenterology

## 2016-06-30 DIAGNOSIS — F419 Anxiety disorder, unspecified: Secondary | ICD-10-CM | POA: Diagnosis not present

## 2016-06-30 DIAGNOSIS — Z1211 Encounter for screening for malignant neoplasm of colon: Secondary | ICD-10-CM | POA: Diagnosis not present

## 2016-06-30 DIAGNOSIS — F329 Major depressive disorder, single episode, unspecified: Secondary | ICD-10-CM | POA: Insufficient documentation

## 2016-06-30 DIAGNOSIS — Z90710 Acquired absence of both cervix and uterus: Secondary | ICD-10-CM | POA: Insufficient documentation

## 2016-06-30 DIAGNOSIS — Z79890 Hormone replacement therapy (postmenopausal): Secondary | ICD-10-CM | POA: Diagnosis not present

## 2016-06-30 DIAGNOSIS — Z79899 Other long term (current) drug therapy: Secondary | ICD-10-CM | POA: Diagnosis not present

## 2016-06-30 DIAGNOSIS — Z882 Allergy status to sulfonamides status: Secondary | ICD-10-CM | POA: Diagnosis not present

## 2016-06-30 DIAGNOSIS — K5520 Angiodysplasia of colon without hemorrhage: Secondary | ICD-10-CM | POA: Insufficient documentation

## 2016-06-30 DIAGNOSIS — Q2733 Arteriovenous malformation of digestive system vessel: Secondary | ICD-10-CM | POA: Diagnosis not present

## 2016-06-30 DIAGNOSIS — Z820 Family history of epilepsy and other diseases of the nervous system: Secondary | ICD-10-CM | POA: Insufficient documentation

## 2016-06-30 DIAGNOSIS — J45909 Unspecified asthma, uncomplicated: Secondary | ICD-10-CM | POA: Diagnosis not present

## 2016-06-30 DIAGNOSIS — Z880 Allergy status to penicillin: Secondary | ICD-10-CM | POA: Insufficient documentation

## 2016-06-30 DIAGNOSIS — Z9049 Acquired absence of other specified parts of digestive tract: Secondary | ICD-10-CM | POA: Diagnosis not present

## 2016-06-30 DIAGNOSIS — K648 Other hemorrhoids: Secondary | ICD-10-CM | POA: Diagnosis not present

## 2016-06-30 DIAGNOSIS — Z803 Family history of malignant neoplasm of breast: Secondary | ICD-10-CM | POA: Insufficient documentation

## 2016-06-30 DIAGNOSIS — Z79891 Long term (current) use of opiate analgesic: Secondary | ICD-10-CM | POA: Insufficient documentation

## 2016-06-30 DIAGNOSIS — K640 First degree hemorrhoids: Secondary | ICD-10-CM | POA: Insufficient documentation

## 2016-06-30 DIAGNOSIS — M1990 Unspecified osteoarthritis, unspecified site: Secondary | ICD-10-CM | POA: Diagnosis not present

## 2016-06-30 HISTORY — PX: COLONOSCOPY WITH PROPOFOL: SHX5780

## 2016-06-30 SURGERY — COLONOSCOPY WITH PROPOFOL
Anesthesia: General

## 2016-06-30 MED ORDER — FENTANYL CITRATE (PF) 100 MCG/2ML IJ SOLN
INTRAMUSCULAR | Status: AC
  Filled 2016-06-30: qty 2

## 2016-06-30 MED ORDER — LIDOCAINE HCL (PF) 2 % IJ SOLN
INTRAMUSCULAR | Status: AC
  Filled 2016-06-30: qty 2

## 2016-06-30 MED ORDER — PROPOFOL 500 MG/50ML IV EMUL
INTRAVENOUS | Status: AC
  Filled 2016-06-30: qty 50

## 2016-06-30 MED ORDER — PROPOFOL 500 MG/50ML IV EMUL
INTRAVENOUS | Status: DC | PRN
  Administered 2016-06-30: 150 ug/kg/min via INTRAVENOUS

## 2016-06-30 MED ORDER — SODIUM CHLORIDE 0.9 % IV SOLN
INTRAVENOUS | Status: DC
  Administered 2016-06-30: 10:00:00 via INTRAVENOUS

## 2016-06-30 MED ORDER — MIDAZOLAM HCL 2 MG/2ML IJ SOLN
INTRAMUSCULAR | Status: DC | PRN
  Administered 2016-06-30: 1 mg via INTRAVENOUS

## 2016-06-30 MED ORDER — PROPOFOL 10 MG/ML IV BOLUS
INTRAVENOUS | Status: DC | PRN
  Administered 2016-06-30: 70 mg via INTRAVENOUS
  Administered 2016-06-30: 20 mg via INTRAVENOUS

## 2016-06-30 MED ORDER — MIDAZOLAM HCL 2 MG/2ML IJ SOLN
INTRAMUSCULAR | Status: AC
  Filled 2016-06-30: qty 2

## 2016-06-30 MED ORDER — FENTANYL CITRATE (PF) 100 MCG/2ML IJ SOLN
INTRAMUSCULAR | Status: DC | PRN
  Administered 2016-06-30: 25 ug via INTRAVENOUS

## 2016-06-30 NOTE — Anesthesia Post-op Follow-up Note (Cosign Needed)
Anesthesia QCDR form completed.        

## 2016-06-30 NOTE — Anesthesia Procedure Notes (Signed)
Date/Time: 06/30/2016 10:50 AM Performed by: Hedda Slade Pre-anesthesia Checklist: Patient identified, Emergency Drugs available, Suction available and Patient being monitored Patient Re-evaluated:Patient Re-evaluated prior to inductionOxygen Delivery Method: Nasal cannula

## 2016-06-30 NOTE — H&P (Signed)
  Jonathon Bellows MD [address], [place], [place] [address] Phone: [phone] Fax : [phone]  Primary Care Physician:  Mar Daring, PA-C Primary Gastroenterologist:  Dr. Jonathon Bellows   Pre-Procedure History & Physical: HPI:  Patricia Decker is a 61 y.o. female is here for an colonoscopy.   Past Medical History:  Diagnosis Date  . Anemia   . Anxiety   . Arthritis   . Asthma   . Depression     Past Surgical History:  Procedure Laterality Date  . ABDOMINAL HYSTERECTOMY    . APPENDECTOMY    . CHOLECYSTECTOMY      Prior to Admission medications   Medication Sig Start Date End Date Taking? Authorizing Provider  ALPRAZolam Duanne Moron) 1 MG tablet Take 2 tablets by mouth 3 (three) times daily.  08/26/08  Yes Historical Provider, MD  ARIPIPRAZOLE PO Take 1 tablet by mouth daily.    Yes Historical Provider, MD  estradiol (ESTRACE) 1 MG tablet Take 1 tablet (1 mg total) by mouth daily. 06/13/16  Yes Mar Daring, PA-C  HYDROcodone-acetaminophen (NORCO) 10-325 MG tablet Take 1 tablet by mouth every 8 (eight) hours as needed for severe pain. Please do NOT fill until 06/19/16 06/13/16  Yes Clearnce Sorrel Burnette, PA-C  venlafaxine XR (EFFEXOR XR) 150 MG 24 hr capsule Take 2 capsules by mouth daily.  03/15/10  Yes Historical Provider, MD    Allergies as of 06/17/2016 - Review Complete 06/13/2016  Allergen Reaction Noted  . Penicillins  11/13/2014  . Sulfa antibiotics  11/13/2014    Family History  Problem Relation Age of Onset  . Alzheimer's disease Sister   . Breast cancer Sister 62    Social History   Social History  . Marital status: Widowed    Spouse name: N/A  . Number of children: N/A  . Years of education: N/A   Occupational History  . Not on file.   Social History Main Topics  . Smoking status: Never Smoker  . Smokeless tobacco: Never Used  . Alcohol use No  . Drug use: No  . Sexual activity: Not on file   Other Topics Concern  . Not on file    Social History Narrative  . No narrative on file    Review of Systems: See HPI, otherwise negative ROS  Physical Exam: BP (!) 150/91   Pulse (!) 58   Temp 97.7 F (36.5 C)   Resp 16   Ht 5\' 5"  (1.651 m)   Wt 135 lb (61.2 kg)   SpO2 100%   BMI 22.47 kg/m  General:   Alert,  pleasant and cooperative in NAD Head:  Normocephalic and atraumatic. Neck:  Supple; no masses or thyromegaly. Lungs:  Clear throughout to auscultation.    Heart:  Regular rate and rhythm. Abdomen:  Soft, nontender and nondistended. Normal bowel sounds, without guarding, and without rebound.   Neurologic:  Alert and  oriented x4;  grossly normal neurologically.  Impression/Plan: Patricia Decker is here for an colonoscopy to be performed for Screening colonoscopy average risk    Risks, benefits, limitations, and alternatives regarding  colonoscopy have been reviewed with the patient.  Questions have been answered.  All parties agreeable.   Jonathon Bellows, MD  06/30/2016, 10:03 AM

## 2016-06-30 NOTE — Transfer of Care (Signed)
Immediate Anesthesia Transfer of Care Note  Patient: Patricia Decker  Procedure(s) Performed: Procedure(s): COLONOSCOPY WITH PROPOFOL (N/A)  Patient Location: PACU  Anesthesia Type:General  Level of Consciousness: awake, alert  and oriented  Airway & Oxygen Therapy: Patient Spontanous Breathing and Patient connected to nasal cannula oxygen  Post-op Assessment: Report given to RN and Post -op Vital signs reviewed and stable  Post vital signs: Reviewed and stable  Last Vitals:  Vitals:   06/30/16 1114 06/30/16 1115  BP: (!) 99/59   Pulse: (!) 56 (!) 55  Resp: 19 20  Temp: (!) 35.9 C (!) 35.9 C    Last Pain:  Vitals:   06/30/16 1115  TempSrc: (P) Tympanic         Complications: No apparent anesthesia complications

## 2016-06-30 NOTE — Op Note (Signed)
[facility] Gastroenterology Patient Name: Patricia Decker Procedure Date: 06/30/2016 10:49 AM MRN: [id number] Account #: [id number] Date of Birth: [birth year] Admit Type: Outpatient Age: 61 Room: [facility] ENDO ROOM 4 Gender: Female Note Status: Finalized Procedure:            Colonoscopy Indications:          Screening for colorectal malignant neoplasm Providers:            Jonathon Bellows MD, MD Referring MD:         Mar Daring (Referring MD) Medicines:            Monitored Anesthesia Care Complications:        No immediate complications. Procedure:            Pre-Anesthesia Assessment:                       - Prior to the procedure, a History and Physical was                        performed, and patient medications, allergies and                        sensitivities were reviewed. The patient's tolerance of                        previous anesthesia was reviewed.                       - The risks and benefits of the procedure and the                        sedation options and risks were discussed with the                        patient. All questions were answered and informed                        consent was obtained.                       - ASA Grade Assessment: III - A patient with severe                        systemic disease.                       After obtaining informed consent, the colonoscope was                        passed under direct vision. Throughout the procedure,                        the patient's blood pressure, pulse, and oxygen                        saturations were monitored continuously. The                        Colonoscope was introduced through the anus and  advanced to the the cecum, identified by the                        appendiceal orifice, IC valve and transillumination.                        The colonoscopy was performed with ease. The patient                        tolerated the procedure well.  The quality of the bowel                        preparation was good. Findings:      The perianal and digital rectal examinations were normal.      Non-bleeding internal hemorrhoids were found during retroflexion. The       hemorrhoids were medium-sized and Grade I (internal hemorrhoids that do       not prolapse).      A single medium-sized localized angioectasia without bleeding was found       in the cecum.      The exam was otherwise without abnormality on direct and retroflexion       views. Impression:           - Non-bleeding internal hemorrhoids.                       - A single non-bleeding colonic angioectasia.                       - The examination was otherwise normal on direct and                        retroflexion views.                       - No specimens collected. Recommendation:       - Discharge patient to home (with escort).                       - Resume previous diet.                       - Continue present medications.                       - Repeat colonoscopy in 10 years for surveillance. Procedure Code(s):    --- Professional ---                       N8295, Colorectal cancer screening; colonoscopy on                        individual not meeting criteria for high risk Diagnosis Code(s):    --- Professional ---                       Z12.11, Encounter for screening for malignant neoplasm                        of colon                       K55.20, Angiodysplasia of colon without hemorrhage  K64.0, First degree hemorrhoids CPT copyright 2016 American Medical Association. All rights reserved. The codes documented in this report are preliminary and upon coder review may  be revised to meet current compliance requirements. Jonathon Bellows, MD Jonathon Bellows MD, MD 06/30/2016 11:12:35 AM This report has been signed electronically. Number of Addenda: 0 Note Initiated On: 06/30/2016 10:49 AM Scope Withdrawal Time: 0 hours 9 minutes 49 seconds   Total Procedure Duration: 0 hours 15 minutes 38 seconds       [facility]

## 2016-06-30 NOTE — Anesthesia Postprocedure Evaluation (Signed)
Anesthesia Post Note  Patient: Patricia Decker  Procedure(s) Performed: Procedure(s) (LRB): COLONOSCOPY WITH PROPOFOL (N/A)  Patient location during evaluation: Endoscopy Anesthesia Type: General Level of consciousness: awake and alert Pain management: pain level controlled Vital Signs Assessment: post-procedure vital signs reviewed and stable Respiratory status: spontaneous breathing, nonlabored ventilation, respiratory function stable and patient connected to nasal cannula oxygen Cardiovascular status: blood pressure returned to baseline and stable Postop Assessment: no signs of nausea or vomiting Anesthetic complications: no     Last Vitals:  Vitals:   06/30/16 1149 06/30/16 1157  BP:  (!) 148/95  Pulse: (!) 56 61  Resp: 16 18  Temp:      Last Pain:  Vitals:   06/30/16 1115  TempSrc: Tympanic                 [name]

## 2016-06-30 NOTE — Anesthesia Preprocedure Evaluation (Signed)
Anesthesia Evaluation  Patient identified by MRN, date of birth, ID band Patient awake    Reviewed: Allergy & Precautions, NPO status , Patient's Chart, lab work & pertinent test results, reviewed documented beta blocker date and time   Airway Mallampati: II  TM Distance: >3 FB     Dental  (+) Chipped, Partial Lower, Partial Upper   Pulmonary asthma ,           Cardiovascular      Neuro/Psych  Headaches, PSYCHIATRIC DISORDERS Anxiety Depression  Neuromuscular disease    GI/Hepatic   Endo/Other    Renal/GU      Musculoskeletal  (+) Arthritis ,   Abdominal   Peds  Hematology  (+) anemia ,   Anesthesia Other Findings   Reproductive/Obstetrics                             Anesthesia Physical Anesthesia Plan  ASA: III  Anesthesia Plan: General   Post-op Pain Management:    Induction: Intravenous  Airway Management Planned:   Additional Equipment:   Intra-op Plan:   Post-operative Plan:   Informed Consent: I have reviewed the patients History and Physical, chart, labs and discussed the procedure including the risks, benefits and alternatives for the proposed anesthesia with the patient or authorized representative who has indicated his/her understanding and acceptance.     Plan Discussed with: CRNA  Anesthesia Plan Comments:         Anesthesia Quick Evaluation

## 2016-07-01 ENCOUNTER — Encounter: Payer: Self-pay | Admitting: Gastroenterology

## 2016-07-04 ENCOUNTER — Ambulatory Visit
Admission: RE | Admit: 2016-07-04 | Discharge: 2016-07-04 | Disposition: A | Discharge status: Home / Self Care | Payer: Medicare Other | Source: Ambulatory Visit | Attending: Physician Assistant | Admitting: Physician Assistant

## 2016-07-04 DIAGNOSIS — Z1239 Encounter for other screening for malignant neoplasm of breast: Secondary | ICD-10-CM

## 2016-07-04 DIAGNOSIS — Z1231 Encounter for screening mammogram for malignant neoplasm of breast: Secondary | ICD-10-CM | POA: Diagnosis not present

## 2016-07-05 ENCOUNTER — Telehealth: Payer: Self-pay

## 2016-07-05 NOTE — Telephone Encounter (Signed)
-----   Message from Mar Daring, [place] sent at 07/04/2016  4:51 PM EDT ----- Normal mammogram. Repeat screening in one year.

## 2016-07-05 NOTE — Telephone Encounter (Signed)
Patient has been advised. KW 

## 2016-07-19 ENCOUNTER — Other Ambulatory Visit: Payer: Self-pay | Admitting: Physician Assistant

## 2016-07-19 DIAGNOSIS — M1990 Unspecified osteoarthritis, unspecified site: Secondary | ICD-10-CM

## 2016-07-19 NOTE — Telephone Encounter (Signed)
Pt needs refill on her  HYDROcodone-acetaminophen (NORCO) 10-325 MG tablet  Thanks teri

## 2016-07-19 NOTE — Telephone Encounter (Signed)
Last ov 06/13/16 Last filled 06/13/16

## 2016-07-20 MED ORDER — HYDROCODONE-ACETAMINOPHEN 10-325 MG PO TABS: 1.0000 | ORAL_TABLET | Freq: Three times a day (TID) | ORAL | 0 refills | Status: DC | PRN

## 2016-07-20 NOTE — Telephone Encounter (Signed)
Patient advised.

## 2016-08-18 ENCOUNTER — Other Ambulatory Visit: Payer: Self-pay | Admitting: Physician Assistant

## 2016-08-18 DIAGNOSIS — M1990 Unspecified osteoarthritis, unspecified site: Secondary | ICD-10-CM

## 2016-08-18 MED ORDER — HYDROCODONE-ACETAMINOPHEN 10-325 MG PO TABS: 1.0000 | ORAL_TABLET | Freq: Three times a day (TID) | ORAL | 0 refills | Status: DC | PRN

## 2016-08-18 NOTE — Telephone Encounter (Signed)
Pt contacted office for refill request on the following medications: HYDROcodone-acetaminophen ([place]) 10-325 MG tablet Last Rx: 07/20/16 LOV: 06/13/16 Please advise. Thanks TNP

## 2016-08-18 NOTE — Telephone Encounter (Signed)
Please notify patient Rx is printed.

## 2016-08-18 NOTE — Telephone Encounter (Signed)
Left a message on home voicemail  Advising patient  RX is ready for pick up.

## 2016-09-09 DIAGNOSIS — F4310 Post-traumatic stress disorder, unspecified: Secondary | ICD-10-CM | POA: Diagnosis not present

## 2016-09-09 DIAGNOSIS — Z79899 Other long term (current) drug therapy: Secondary | ICD-10-CM | POA: Diagnosis not present

## 2016-09-19 ENCOUNTER — Other Ambulatory Visit: Payer: Self-pay | Admitting: Physician Assistant

## 2016-09-19 DIAGNOSIS — M1990 Unspecified osteoarthritis, unspecified site: Secondary | ICD-10-CM

## 2016-09-19 MED ORDER — HYDROCODONE-ACETAMINOPHEN 10-325 MG PO TABS: 1.0000 | ORAL_TABLET | Freq: Three times a day (TID) | ORAL | 0 refills | Status: DC | PRN

## 2016-09-19 NOTE — Telephone Encounter (Signed)
Please Review.  Thanks,  -Joseline 

## 2016-09-19 NOTE — Telephone Encounter (Signed)
Rx printed. NCCSR reviewed and no red flags noted.

## 2016-09-19 NOTE — Telephone Encounter (Signed)
Pt needs refill on her hydrocodone 10-325 ° °Thanks teri °

## 2016-10-19 ENCOUNTER — Other Ambulatory Visit: Payer: Self-pay | Admitting: Physician Assistant

## 2016-10-19 DIAGNOSIS — M1990 Unspecified osteoarthritis, unspecified site: Secondary | ICD-10-CM

## 2016-10-19 MED ORDER — HYDROCODONE-ACETAMINOPHEN 10-325 MG PO TABS: 1.0000 | ORAL_TABLET | Freq: Three times a day (TID) | ORAL | 0 refills | Status: DC | PRN

## 2016-10-19 NOTE — Telephone Encounter (Signed)
Left patient a message advising her that RX is at the front desk ready for pick up.

## 2016-10-19 NOTE — Telephone Encounter (Signed)
Pt needs refill on her hydrocodone 10-325  Please call when ready for pick up..  Thanks teri

## 2016-10-19 NOTE — Telephone Encounter (Signed)
Last ov 06/13/16 Last filled 09/19/16 Please review. Thank you. sd

## 2016-10-19 NOTE — Telephone Encounter (Signed)
Rx printed. NCCSR reviewed. 

## 2016-11-15 ENCOUNTER — Telehealth: Payer: Self-pay | Admitting: Physician Assistant

## 2016-11-15 DIAGNOSIS — M1990 Unspecified osteoarthritis, unspecified site: Secondary | ICD-10-CM

## 2016-11-15 MED ORDER — HYDROCODONE-ACETAMINOPHEN 10-325 MG PO TABS: 1.0000 | ORAL_TABLET | Freq: Three times a day (TID) | ORAL | 0 refills | Status: DC | PRN

## 2016-11-15 NOTE — Telephone Encounter (Signed)
Pt contacted office for refill request on the following medications:  HYDROcodone-acetaminophen (NORCO) 10-325 MG tablet   CB#[phone]/MW

## 2016-11-15 NOTE — Telephone Encounter (Signed)
[place] reviewed per guidelines. Rx printed

## 2016-11-15 NOTE — Telephone Encounter (Signed)
Patient advised as below.  

## 2016-11-15 NOTE — Addendum Note (Signed)
Addended by: Mar Daring on: 11/15/2016 11:44 AM   Modules accepted: Orders

## 2016-11-16 DIAGNOSIS — L300 Nummular dermatitis: Secondary | ICD-10-CM | POA: Diagnosis not present

## 2016-12-26 ENCOUNTER — Telehealth: Payer: Self-pay | Admitting: Physician Assistant

## 2016-12-26 DIAGNOSIS — M1990 Unspecified osteoarthritis, unspecified site: Secondary | ICD-10-CM

## 2016-12-26 MED ORDER — HYDROCODONE-ACETAMINOPHEN 10-325 MG PO TABS: 1.0000 | ORAL_TABLET | Freq: Three times a day (TID) | ORAL | 0 refills | Status: DC | PRN

## 2016-12-26 NOTE — Telephone Encounter (Signed)
LM that prescription is placed up front ready for pick up.  Thanks,  -Tyleah Loh

## 2016-12-26 NOTE — Telephone Encounter (Signed)
Pt contacted office for refill request on the following medications:  HYDROcodone-acetaminophen (NORCO) 10-325 MG tablet   CB#[phone] or [phone]/MW

## 2016-12-26 NOTE — Telephone Encounter (Signed)
NCCSR reviewed. Rx printed. 

## 2017-01-23 ENCOUNTER — Other Ambulatory Visit: Payer: Self-pay | Admitting: Physician Assistant

## 2017-01-23 DIAGNOSIS — M1990 Unspecified osteoarthritis, unspecified site: Secondary | ICD-10-CM

## 2017-01-23 NOTE — Telephone Encounter (Signed)
Please review. Thanks!  

## 2017-01-23 NOTE — Telephone Encounter (Signed)
Pt requesting refill of Hydrocodone 10-325.  States she is not totally out until about Wednesday.

## 2017-01-24 MED ORDER — HYDROCODONE-ACETAMINOPHEN 10-325 MG PO TABS: 1.0000 | ORAL_TABLET | Freq: Three times a day (TID) | ORAL | 0 refills | Status: DC | PRN

## 2017-01-24 NOTE — Telephone Encounter (Signed)
LM that prescription is placed up front ready for pick up.  Thanks,  -Joseline

## 2017-01-24 NOTE — Telephone Encounter (Signed)
NCCSR reviewed. Rx printed. 

## 2017-03-02 ENCOUNTER — Other Ambulatory Visit: Payer: Self-pay | Admitting: Physician Assistant

## 2017-03-02 DIAGNOSIS — M1990 Unspecified osteoarthritis, unspecified site: Secondary | ICD-10-CM

## 2017-03-02 MED ORDER — HYDROCODONE-ACETAMINOPHEN 10-325 MG PO TABS: 1.0000 | ORAL_TABLET | Freq: Three times a day (TID) | ORAL | 0 refills | Status: DC | PRN

## 2017-03-02 NOTE — Telephone Encounter (Signed)
NCCSR reviewed. Rx printed. 

## 2017-03-02 NOTE — Telephone Encounter (Signed)
LM that prescription is placed up front ready for pick up.  Thanks,  -Chenae Brager

## 2017-03-02 NOTE — Telephone Encounter (Signed)
Please review. Thanks!  

## 2017-03-02 NOTE — Telephone Encounter (Signed)
Pt needs a refill on her hydrocodone 10/325  Her call back is [phone]  Thanks teri

## 2017-03-21 DIAGNOSIS — G5601 Carpal tunnel syndrome, right upper limb: Secondary | ICD-10-CM | POA: Insufficient documentation

## 2017-03-21 DIAGNOSIS — M5412 Radiculopathy, cervical region: Secondary | ICD-10-CM | POA: Diagnosis not present

## 2017-03-27 ENCOUNTER — Telehealth: Payer: Self-pay | Admitting: Physician Assistant

## 2017-03-27 ENCOUNTER — Other Ambulatory Visit: Payer: Self-pay | Admitting: Physician Assistant

## 2017-03-27 DIAGNOSIS — M1990 Unspecified osteoarthritis, unspecified site: Secondary | ICD-10-CM

## 2017-03-27 MED ORDER — HYDROCODONE-ACETAMINOPHEN 10-325 MG PO TABS: 1.0000 | ORAL_TABLET | Freq: Three times a day (TID) | ORAL | 0 refills | Status: DC | PRN

## 2017-03-27 NOTE — Telephone Encounter (Signed)
Please review. Thanks!  

## 2017-03-27 NOTE — Telephone Encounter (Signed)
Patient needs refill on Hydrocodone 10/325 mg. Call when read

## 2017-03-27 NOTE — Telephone Encounter (Signed)
Needs refill on Hydrocodone 10/325 mg.  Call when ready please

## 2017-03-27 NOTE — Addendum Note (Signed)
Addended by: Mar Daring on: 03/27/2017 01:03 PM   Modules accepted: Orders

## 2017-03-27 NOTE — Telephone Encounter (Signed)
NCCSR reviewed. Rx printed. 

## 2017-04-03 DIAGNOSIS — H524 Presbyopia: Secondary | ICD-10-CM | POA: Diagnosis not present

## 2017-04-03 DIAGNOSIS — H43812 Vitreous degeneration, left eye: Secondary | ICD-10-CM | POA: Diagnosis not present

## 2017-04-03 DIAGNOSIS — H25013 Cortical age-related cataract, bilateral: Secondary | ICD-10-CM | POA: Diagnosis not present

## 2017-04-24 ENCOUNTER — Other Ambulatory Visit: Payer: Self-pay | Admitting: Physician Assistant

## 2017-04-24 DIAGNOSIS — M1990 Unspecified osteoarthritis, unspecified site: Secondary | ICD-10-CM

## 2017-04-24 MED ORDER — HYDROCODONE-ACETAMINOPHEN 10-325 MG PO TABS: 1.0000 | ORAL_TABLET | Freq: Three times a day (TID) | ORAL | 0 refills | Status: DC | PRN

## 2017-04-24 NOTE — Telephone Encounter (Signed)
Patient is requesting a refill on the following medication  HYDROcodone-acetaminophen (NORCO) 10-325 MG tablet   She uses [facility] in [place].

## 2017-04-24 NOTE — Telephone Encounter (Signed)
Prescription placed up front ready for pick up. Patient advised.  Thanks,  -Joseline

## 2017-04-24 NOTE — Telephone Encounter (Signed)
NCCSR reviewed. Rx printed. 

## 2017-04-26 DIAGNOSIS — M7070 Other bursitis of hip, unspecified hip: Secondary | ICD-10-CM | POA: Insufficient documentation

## 2017-04-26 DIAGNOSIS — M7072 Other bursitis of hip, left hip: Secondary | ICD-10-CM | POA: Diagnosis not present

## 2017-05-05 ENCOUNTER — Telehealth: Payer: Self-pay | Admitting: Physician Assistant

## 2017-05-24 ENCOUNTER — Other Ambulatory Visit: Payer: Self-pay | Admitting: Physician Assistant

## 2017-05-24 DIAGNOSIS — M1990 Unspecified osteoarthritis, unspecified site: Secondary | ICD-10-CM

## 2017-05-24 MED ORDER — HYDROCODONE-ACETAMINOPHEN 10-325 MG PO TABS: 1.0000 | ORAL_TABLET | Freq: Three times a day (TID) | ORAL | 0 refills | Status: DC | PRN

## 2017-05-24 NOTE — Telephone Encounter (Signed)
Patient needs refill on Hydrocodone 10-325 mg. To [facility]

## 2017-05-24 NOTE — Telephone Encounter (Signed)
NCCSR reviewed. Rx sent in. 

## 2017-06-08 NOTE — Telephone Encounter (Signed)
complete

## 2017-06-13 DIAGNOSIS — H18413 Arcus senilis, bilateral: Secondary | ICD-10-CM | POA: Diagnosis not present

## 2017-06-13 DIAGNOSIS — H2512 Age-related nuclear cataract, left eye: Secondary | ICD-10-CM | POA: Diagnosis not present

## 2017-06-13 DIAGNOSIS — H2513 Age-related nuclear cataract, bilateral: Secondary | ICD-10-CM | POA: Diagnosis not present

## 2017-06-13 DIAGNOSIS — H25043 Posterior subcapsular polar age-related cataract, bilateral: Secondary | ICD-10-CM | POA: Diagnosis not present

## 2017-06-13 DIAGNOSIS — H25013 Cortical age-related cataract, bilateral: Secondary | ICD-10-CM | POA: Diagnosis not present

## 2017-06-14 ENCOUNTER — Encounter: Payer: Medicare Other | Admitting: Physician Assistant

## 2017-06-22 ENCOUNTER — Ambulatory Visit (INDEPENDENT_AMBULATORY_CARE_PROVIDER_SITE_OTHER): Payer: Medicare Other | Admitting: Physician Assistant

## 2017-06-22 ENCOUNTER — Other Ambulatory Visit: Payer: Self-pay | Admitting: Physician Assistant

## 2017-06-22 ENCOUNTER — Encounter: Payer: Self-pay | Admitting: Physician Assistant

## 2017-06-22 ENCOUNTER — Ambulatory Visit (INDEPENDENT_AMBULATORY_CARE_PROVIDER_SITE_OTHER): Payer: Medicare Other

## 2017-06-22 VITALS — BP 118/60 | HR 60 | Temp 99.2°F | Ht 65.0 in | Wt 130.0 lb

## 2017-06-22 DIAGNOSIS — Z0000 Encounter for general adult medical examination without abnormal findings: Secondary | ICD-10-CM | POA: Diagnosis not present

## 2017-06-22 DIAGNOSIS — J4520 Mild intermittent asthma, uncomplicated: Secondary | ICD-10-CM | POA: Diagnosis not present

## 2017-06-22 DIAGNOSIS — D508 Other iron deficiency anemias: Secondary | ICD-10-CM | POA: Diagnosis not present

## 2017-06-22 DIAGNOSIS — M7062 Trochanteric bursitis, left hip: Secondary | ICD-10-CM

## 2017-06-22 DIAGNOSIS — M1990 Unspecified osteoarthritis, unspecified site: Secondary | ICD-10-CM

## 2017-06-22 DIAGNOSIS — M5116 Intervertebral disc disorders with radiculopathy, lumbar region: Secondary | ICD-10-CM

## 2017-06-22 DIAGNOSIS — F419 Anxiety disorder, unspecified: Secondary | ICD-10-CM | POA: Diagnosis not present

## 2017-06-22 NOTE — Progress Notes (Signed)
Subjective:   Patricia Decker is a 62 y.o. female who presents for Medicare Annual (Subsequent) preventive examination.  Review of Systems:  N/A        Objective:     Vitals: BP 118/60 (BP Location: Left Arm)   Pulse 60   Temp 99.2 F (37.3 C) (Oral)   Ht 5\' 5"  (1.651 m)   Wt 130 lb (59 kg)   BMI 21.63 kg/m   Body mass index is 21.63 kg/m.  Advanced Directives 06/22/2017 06/30/2016 06/11/2015  Does Patient Have a Medical Advance Directive? No No No  Would patient like information on creating a medical advance directive? Yes (MAU/Ambulatory/Procedural Areas - Information given) - -    Tobacco Social History   Tobacco Use  Smoking Status Never Smoker  Smokeless Tobacco Never Used     Counseling given: Not Answered   Clinical Intake:  Pre-visit preparation completed: Yes  Pain : No/denies pain Pain Score: 0-No pain     Nutritional Status: BMI of 19-24  Normal Nutritional Risks: None Diabetes: No  How often do you need to have someone help you when you read instructions, pamphlets, or other written materials from your doctor or pharmacy?: 1 - Never  Interpreter Needed?: No  Information entered by :: [facility], LPN  Past Medical History:  Diagnosis Date  . Anemia   . Anxiety   . Arthritis   . Asthma   . Depression    Past Surgical History:  Procedure Laterality Date  . ABDOMINAL HYSTERECTOMY    . APPENDECTOMY    . CHOLECYSTECTOMY    . COLONOSCOPY WITH PROPOFOL N/A 06/30/2016   Procedure: COLONOSCOPY WITH PROPOFOL;  Surgeon: Jonathon Bellows, MD;  Location: [facility] ENDOSCOPY;  Service: Endoscopy;  Laterality: N/A;   Family History  Problem Relation Age of Onset  . Alzheimer's disease Sister   . Breast cancer Sister 35   Social History   Socioeconomic History  . Marital status: Widowed    Spouse name: Not on file  . Number of children: 1  . Years of education: Not on file  . Highest education level: Some college, no degree  Occupational History  .  Occupation: disability  Social Needs  . Financial resource strain: Not hard at all  . Food insecurity:    Worry: Never true    Inability: Never true  . Transportation needs:    Medical: No    Non-medical: No  Tobacco Use  . Smoking status: Never Smoker  . Smokeless tobacco: Never Used  Substance and Sexual Activity  . Alcohol use: No  . Drug use: No  . Sexual activity: Not on file  Lifestyle  . Physical activity:    Days per week: Not on file    Minutes per session: Not on file  . Stress: Not at all  Relationships  . Social connections:    Talks on phone: Not on file    Gets together: Not on file    Attends religious service: Not on file    Active member of club or organization: Not on file    Attends meetings of clubs or organizations: Not on file    Relationship status: Not on file  Other Topics Concern  . Not on file  Social History Narrative  . Not on file    Outpatient Encounter Medications as of 06/22/2017  Medication Sig  . ALPRAZolam (XANAX) 1 MG tablet Take 2 tablets by mouth 3 (three) times daily.   . ARIPIPRAZOLE PO Take  1 tablet by mouth daily.   Marland Kitchen estradiol (ESTRACE) 1 MG tablet Take 1 tablet (1 mg total) by mouth daily. (Patient taking differently: Take 1 mg by mouth daily. )  . HYDROcodone-acetaminophen (NORCO) 10-325 MG tablet Take 1 tablet by mouth every 8 (eight) hours as needed for severe pain.  Marland Kitchen venlafaxine XR (EFFEXOR XR) 150 MG 24 hr capsule Take 2 capsules by mouth daily.    No facility-administered encounter medications on file as of 06/22/2017.     Activities of Daily Living In your present state of health, do you have any difficulty performing the following activities: 06/22/2017  Hearing? N  Vision? Y  Comment Due to cataracts.  Difficulty concentrating or making decisions? N  Walking or climbing stairs? N  Dressing or bathing? N  Doing errands, shopping? N  Preparing Food and eating ? N  Using the Toilet? N  In the past six months,  have you accidently leaked urine? N  Do you have problems with loss of bowel control? N  Managing your Medications? N  Managing your Finances? N  Housekeeping or managing your Housekeeping? N  Some recent data might be hidden    Patient Care Team: Mar Daring, PA-C as PCP - General (Family Medicine) Bernita Raisin, MD as Consulting Physician Earnestine Leys, MD as Consulting Physician (Orthopedic Surgery)    Assessment:   This is a routine wellness examination for Patricia Decker.  Exercise Activities and Dietary recommendations Current Exercise Habits: The patient does not participate in regular exercise at present(does a lot of yardwork and walking), Exercise limited by: orthopedic condition(s)  Goals    . DIET - INCREASE LEAN PROTEINS     Recommend to continue current diet regimen of cutting out pork and eating more lean meats.        Fall Risk Fall Risk  06/22/2017 06/11/2015  Falls in the past year? No No   Is the patient's home free of loose throw rugs in walkways, pet beds, electrical cords, etc?   yes      Grab bars in the bathroom? no      Handrails on the stairs?   no      Adequate lighting?   yes  Timed Get Up and Go performed: N/A  Depression Screen PHQ 2/9 Scores 06/22/2017 06/13/2016 06/11/2015  PHQ - 2 Score 2 2 1   PHQ- 9 Score 5 6 -     Cognitive Function     6CIT Screen 06/22/2017  What Year? 0 points  What month? 0 points  What time? 0 points  Count back from 20 0 points  Months in reverse 0 points  Repeat phrase 0 points  Total Score 0     There is no immunization history on file for this patient.  Qualifies for Shingles Vaccine? Due for Shingles vaccine. Declined my offer to administer today. Education has been provided regarding the importance of this vaccine. Pt has been advised to call her insurance company to determine her out of pocket expense. Advised she may also receive this vaccine at her local pharmacy or Health Dept. Verbalized  acceptance and understanding.  Screening Tests Health Maintenance  Topic Date Due  . TETANUS/TDAP  05/10/1976  . INFLUENZA VACCINE  10/05/2017  . PAP SMEAR  06/11/2018  . MAMMOGRAM  07/05/2018  . COLONOSCOPY  07/01/2026  . Hepatitis C Screening  Completed  . HIV Screening  Completed    Cancer Screenings: Lung: Low Dose CT Chest recommended if Age 45-80 years,  30 pack-year currently smoking OR have quit w/in 15years. Patient does not qualify. Breast:  Up to date on Mammogram? Yes   Up to date of Bone Density/Dexa? N/A Colorectal: Up to date  Additional Screenings:  Hepatitis C Screening: Up to date     Plan:  I have personally reviewed and addressed the Medicare Annual Wellness questionnaire and have noted the following in the patient's chart:  A. Medical and social history B. Use of alcohol, tobacco or illicit drugs  C. Current medications and supplements D. Functional ability and status E.  Nutritional status F.  Physical activity G. Advance directives H. List of other physicians I.  Hospitalizations, surgeries, and ER visits in previous 12 months J.  [place] such as hearing and vision if needed, cognitive and depression L. Referrals and appointments - none  In addition, I have reviewed and discussed with patient certain preventive protocols, quality metrics, and best practice recommendations. A written personalized care plan for preventive services as well as general preventive health recommendations were provided to patient.  See attached scanned questionnaire for additional information.   Signed,  Fabio Neighbors, LPN Nurse Health Advisor   Nurse Recommendations: Pt declined the tetanus vaccine today.

## 2017-06-22 NOTE — Progress Notes (Signed)
Patient: Patricia Decker, Female    DOB: 03/01/1956, 62 y.o.   MRN: [id number] Visit Date: 06/22/2017  Today's Provider: Mar Daring, PA-C   No chief complaint on file.  Subjective:  Patient saw Mckenzie at 8:30am today.   Patricia Decker is a 62 y.o. female. She feels well. She reports exercising not regularly. She reports she is sleeping well.  Mammogram- 07/04/2016. Normal. Pap- 06/11/2015. Normal. Colonoscopy- 06/30/2016. Normal, repeat in 10 years.   Iron deficiency anemia, follow up: Patient was last seen 1 year ago. No changes were made in her medications. She currently does not take any supplementation for iron.   Review of Systems  Constitutional: Negative.   HENT: Negative.   Eyes: Negative.   Respiratory: Negative.   Cardiovascular: Negative.   Gastrointestinal: Negative.   Endocrine: Negative.   Genitourinary: Negative.   Musculoskeletal: Negative.   Skin: Negative.   Allergic/Immunologic: Negative.   Neurological: Negative.   Hematological: Negative.   Psychiatric/Behavioral: Negative.     Social History   Socioeconomic History  . Marital status: Widowed    Spouse name: Not on file  . Number of children: 1  . Years of education: Not on file  . Highest education level: Some college, no degree  Occupational History  . Occupation: disability  Social Needs  . Financial resource strain: Not hard at all  . Food insecurity:    Worry: Never true    Inability: Never true  . Transportation needs:    Medical: No    Non-medical: No  Tobacco Use  . Smoking status: Never Smoker  . Smokeless tobacco: Never Used  Substance and Sexual Activity  . Alcohol use: No  . Drug use: No  . Sexual activity: Not on file  Lifestyle  . Physical activity:    Days per week: Not on file    Minutes per session: Not on file  . Stress: Not at all  Relationships  . Social connections:    Talks on phone: Not on file    Gets together: Not on file    Attends  religious service: Not on file    Active member of club or organization: Not on file    Attends meetings of clubs or organizations: Not on file    Relationship status: Not on file  . Intimate partner violence:    Fear of current or ex partner: Not on file    Emotionally abused: Not on file    Physically abused: Not on file    Forced sexual activity: Not on file  Other Topics Concern  . Not on file  Social History Narrative  . Not on file    Past Medical History:  Diagnosis Date  . Anemia   . Anxiety   . Arthritis   . Asthma   . Depression      Patient Active Problem List   Diagnosis Date Noted  . Arthritis 11/13/2014  . Airway hyperreactivity 11/13/2014  . Atypical nevus 11/13/2014  . Folliculitis 61/60/7371  . Blood in the urine 11/13/2014  . Cramp in muscle 11/13/2014  . Bee sting reaction 11/13/2014  . Neuritis or radiculitis due to rupture of lumbar intervertebral disc 10/17/2013  . Abnormal loss of weight 11/05/2008  . Absolute anemia 11/05/2008  . Menopausal symptom 11/05/2008  . Migraine aura without headache 11/05/2008  . Anxiety 08/26/2008  . Clinical depression 08/26/2008    Past Surgical History:  Procedure Laterality Date  . ABDOMINAL  HYSTERECTOMY    . APPENDECTOMY    . CHOLECYSTECTOMY    . COLONOSCOPY WITH PROPOFOL N/A 06/30/2016   Procedure: COLONOSCOPY WITH PROPOFOL;  Surgeon: Jonathon Bellows, MD;  Location: [facility] ENDOSCOPY;  Service: Endoscopy;  Laterality: N/A;    Her family history includes Alzheimer's disease in her sister; Breast cancer (age of onset: 15) in her sister.      Current Outpatient Medications:  .  ALPRAZolam (XANAX) 1 MG tablet, Take 2 tablets by mouth 3 (three) times daily. , Disp: , Rfl:  .  ARIPIPRAZOLE PO, Take 1 tablet by mouth daily. , Disp: , Rfl:  .  estradiol (ESTRACE) 1 MG tablet, Take 1 tablet (1 mg total) by mouth daily. (Patient taking differently: Take 1 mg by mouth daily. ), Disp: 90 tablet, Rfl: 1 .   HYDROcodone-acetaminophen (NORCO) 10-325 MG tablet, Take 1 tablet by mouth every 8 (eight) hours as needed for severe pain., Disp: 60 tablet, Rfl: 0 .  venlafaxine XR (EFFEXOR XR) 150 MG 24 hr capsule, Take 2 capsules by mouth daily. , Disp: , Rfl:   Patient Care Team: Mar Daring, PA-C as PCP - General (Family Medicine) Bernita Raisin, MD as Consulting Physician Earnestine Leys, MD as Consulting Physician (Orthopedic Surgery)     Objective:    Vitals: BP 118/60 (BP Location: Left Arm)    Pulse 60    Temp 99.2 F (37.3 C) (Oral)    Ht 5\' 5"  (1.651 m)    Wt 130 lb (59 kg)    BMI 21.63 kg/m    BSA 1.64 m   Physical Exam  Constitutional: She is oriented to person, place, and time. She appears well-developed and well-nourished. No distress.  HENT:  Head: Normocephalic and atraumatic.  Right Ear: Hearing, tympanic membrane, external ear and ear canal normal.  Left Ear: Hearing, tympanic membrane, external ear and ear canal normal.  Nose: Nose normal.  Mouth/Throat: Uvula is midline, oropharynx is clear and moist and mucous membranes are normal. No oropharyngeal exudate.  Eyes: Pupils are equal, round, and reactive to light. Conjunctivae and EOM are normal. Right eye exhibits no discharge. Left eye exhibits no discharge. No scleral icterus.  Neck: Normal range of motion. Neck supple. No JVD present. No tracheal deviation present. No thyromegaly present.  Cardiovascular: Normal rate, regular rhythm, normal heart sounds and intact distal pulses. Exam reveals no gallop and no friction rub.  No murmur heard. Pulmonary/Chest: Effort normal and breath sounds normal. No respiratory distress. She has no wheezes. She has no rales. She exhibits no tenderness.  Abdominal: Soft. Bowel sounds are normal. She exhibits no distension and no mass. There is no tenderness. There is no rebound and no guarding.  Musculoskeletal: Normal range of motion. She exhibits no edema.       Left hip:  She exhibits tenderness. She exhibits normal range of motion and normal strength.       Legs: Lymphadenopathy:    She has no cervical adenopathy.  Neurological: She is alert and oriented to person, place, and time.  Skin: Skin is warm and dry. No rash noted. She is not diaphoretic.  Psychiatric: She has a normal mood and affect. Her behavior is normal. Judgment and thought content normal.  Vitals reviewed.   Activities of Daily Living In your present state of health, do you have any difficulty performing the following activities: 06/22/2017  Hearing? N  Vision? Y  Comment Due to cataracts.  Difficulty concentrating or making decisions? N  Walking  or climbing stairs? N  Dressing or bathing? N  Doing errands, shopping? N  Preparing Food and eating ? N  Using the Toilet? N  In the past six months, have you accidently leaked urine? N  Do you have problems with loss of bowel control? N  Managing your Medications? N  Managing your Finances? N  Housekeeping or managing your Housekeeping? N  Some recent data might be hidden    Fall Risk Assessment Fall Risk  06/22/2017 06/11/2015  Falls in the past year? No No     Depression Screen PHQ 2/9 Scores 06/22/2017 06/13/2016 06/11/2015  PHQ - 2 Score 2 2 1   PHQ- 9 Score 5 6 -    Cognitive Testing - 6-CIT 6CIT Screen 06/22/2017  What Year? 0 points  What month? 0 points  What time? 0 points  Count back from 20 0 points  Months in reverse 0 points  Repeat phrase 0 points  Total Score 0         Assessment & Plan:     Annual Wellness Visit  Reviewed patient's Family Medical History Reviewed and updated list of patient's medical providers Assessment of cognitive impairment was done Assessed patient's functional ability Established a written schedule for health screening [place] Completed and Reviewed  Exercise Activities and Dietary recommendations Goals    . DIET - INCREASE LEAN PROTEINS     Recommend  to continue current diet regimen of cutting out pork and eating more lean meats.         There is no immunization history on file for this patient.  Health Maintenance  Topic Date Due  . TETANUS/TDAP  05/10/1976  . INFLUENZA VACCINE  10/05/2017  . PAP SMEAR  06/11/2018  . MAMMOGRAM  07/05/2018  . COLONOSCOPY  07/01/2026  . Hepatitis C Screening  Completed  . HIV Screening  Completed     Discussed health benefits of physical activity, and encouraged her to engage in regular exercise appropriate for her age and condition.    1. Neuritis or radiculitis due to rupture of lumbar intervertebral disc Stable. Still with neuropathy that runs down the left anterolateral thigh to the knee, occasionally to the foot. Uses hydrocodone-apap every 8 hrs prn for pain. Patient unable to give urine for annual drug screen today. Will have her return in 4 weeks or so for urine drug screen for continued prescriptions. Otherwise she will be good for a year until her next annual exam. She is to call if she has any acute issues in the meantime.  - CBC w/Diff/Platelet - Comprehensive Metabolic Panel (CMET)  2. Mild intermittent asthma without complication Stable. No treatment required. Will check labs as below and f/u pending results. - CBC w/Diff/Platelet - Comprehensive Metabolic Panel (CMET)  3. Anxiety Stable. Followed by psychiatry.  - CBC w/Diff/Platelet - Comprehensive Metabolic Panel (CMET)  4. Trochanteric bursitis of left hip Worsening. Had steroid injection approx 4-6 weeks ago. Is going to follow up with Dr. Sabra Heck today she reports.   5. Other iron deficiency anemia Stable. Will check labs as below and f/u pending results.    Mar Daring, PA-C  [facility]

## 2017-06-22 NOTE — Patient Instructions (Addendum)
Patricia Decker , Thank you for taking time to come for your Medicare Wellness Visit. I appreciate your ongoing commitment to your health goals. Please review the following plan we discussed and let me know if I can assist you in the future.   Screening recommendations/referrals: Colonoscopy: Up to date Mammogram: Up to date Bone Density: N/A Recommended yearly ophthalmology/optometry visit for glaucoma screening and checkup Recommended yearly dental visit for hygiene and checkup  Vaccinations: Influenza vaccine: N/A Pneumococcal vaccine: N/A Tdap vaccine: Pt declines today.  Shingles vaccine: Pt declines today.     Advanced directives: Advance directive discussed with you today. I have provided a copy for you to complete at home and have notarized. Once this is complete please bring a copy in to our office so we can scan it into your chart.  Conditions/risks identified: Recommend to continue current diet regimen of cutting out pork and eating more lean meats.   Next appointment: 9:00 AM today with [place] Years, Female Preventive care refers to lifestyle choices and visits with your health care provider that can promote health and wellness. What does preventive care include?  A yearly physical exam. This is also called an annual well check.  Dental exams once or twice a year.  Routine eye exams. Ask your health care provider how often you should have your eyes checked.  Personal lifestyle choices, including:  Daily care of your teeth and gums.  Regular physical activity.  Eating a healthy diet.  Avoiding tobacco and drug use.  Limiting alcohol use.  Practicing safe sex.  Taking low-dose aspirin daily starting at age 47.  Taking vitamin and mineral supplements as recommended by your health care provider. What happens during an annual well check? The services and screenings done by your health care provider during your annual well check will  depend on your age, overall health, lifestyle risk factors, and family history of disease. Counseling  Your health care provider may ask you questions about your:  Alcohol use.  Tobacco use.  Drug use.  Emotional well-being.  Home and relationship well-being.  Sexual activity.  Eating habits.  Work and work Statistician.  Method of birth control.  Menstrual cycle.  Pregnancy history. Screening  You may have the following tests or measurements:  Height, weight, and BMI.  Blood pressure.  Lipid and cholesterol levels. These may be checked every 5 years, or more frequently if you are over 62 years old.  Skin check.  Lung cancer screening. You may have this screening every year starting at age 27 if you have a 30-pack-year history of smoking and currently smoke or have quit within the past 15 years.  Fecal occult blood test (FOBT) of the stool. You may have this test every year starting at age 62.  Flexible sigmoidoscopy or colonoscopy. You may have a sigmoidoscopy every 5 years or a colonoscopy every 10 years starting at age 12.  Hepatitis C blood test.  Hepatitis B blood test.  Sexually transmitted disease (STD) testing.  Diabetes screening. This is done by checking your blood sugar (glucose) after you have not eaten for a while (fasting). You may have this done every 1-3 years.  Mammogram. This may be done every 1-2 years. Talk to your health care provider about when you should start having regular mammograms. This may depend on whether you have a family history of breast cancer.  BRCA-related cancer screening. This may be done if you have a family history of breast,  ovarian, tubal, or peritoneal cancers.  Pelvic exam and Pap test. This may be done every 3 years starting at age 3. Starting at age 34, this may be done every 5 years if you have a Pap test in combination with an HPV test.  Bone density scan. This is done to screen for osteoporosis. You may have  this scan if you are at high risk for osteoporosis. Discuss your test results, treatment options, and if necessary, the need for more tests with your health care provider. Vaccines  Your health care provider may recommend certain vaccines, such as:  Influenza vaccine. This is recommended every year.  Tetanus, diphtheria, and acellular pertussis (Tdap, Td) vaccine. You may need a Td booster every 10 years.  Zoster vaccine. You may need this after age 64.  Pneumococcal 13-valent conjugate (PCV13) vaccine. You may need this if you have certain conditions and were not previously vaccinated.  Pneumococcal polysaccharide (PPSV23) vaccine. You may need one or two doses if you smoke cigarettes or if you have certain conditions. Talk to your health care provider about which screenings and vaccines you need and how often you need them. This information is not intended to replace advice given to you by your health care provider. Make sure you discuss any questions you have with your health care provider. Document Released: 03/20/2015 Document Revised: 11/11/2015 Document Reviewed: 12/23/2014 Elsevier Interactive Patient Education  2017 [place] Prevention in the Home Falls can cause injuries. They can happen to people of all ages. There are many things you can do to make your home safe and to help prevent falls. What can I do on the outside of my home?  Regularly fix the edges of walkways and driveways and fix any cracks.  Remove anything that might make you trip as you walk through a door, such as a raised step or threshold.  Trim any bushes or trees on the path to your home.  Use bright outdoor lighting.  Clear any walking paths of anything that might make someone trip, such as rocks or tools.  Regularly check to see if handrails are loose or broken. Make sure that both sides of any steps have handrails.  Any raised decks and porches should have guardrails on the  edges.  Have any leaves, snow, or ice cleared regularly.  Use sand or salt on walking paths during winter.  Clean up any spills in your garage right away. This includes oil or grease spills. What can I do in the bathroom?  Use night lights.  Install grab bars by the toilet and in the tub and shower. Do not use towel bars as grab bars.  Use non-skid mats or decals in the tub or shower.  If you need to sit down in the shower, use a plastic, non-slip stool.  Keep the floor dry. Clean up any water that spills on the floor as soon as it happens.  Remove soap buildup in the tub or shower regularly.  Attach bath mats securely with double-sided non-slip rug tape.  Do not have throw rugs and other things on the floor that can make you trip. What can I do in the bedroom?  Use night lights.  Make sure that you have a light by your bed that is easy to reach.  Do not use any sheets or blankets that are too big for your bed. They should not hang down onto the floor.  Have a firm chair that has side  arms. You can use this for support while you get dressed.  Do not have throw rugs and other things on the floor that can make you trip. What can I do in the kitchen?  Clean up any spills right away.  Avoid walking on wet floors.  Keep items that you use a lot in easy-to-reach places.  If you need to reach something above you, use a strong step stool that has a grab bar.  Keep electrical cords out of the way.  Do not use floor polish or wax that makes floors slippery. If you must use wax, use non-skid floor wax.  Do not have throw rugs and other things on the floor that can make you trip. What can I do with my stairs?  Do not leave any items on the stairs.  Make sure that there are handrails on both sides of the stairs and use them. Fix handrails that are broken or loose. Make sure that handrails are as long as the stairways.  Check any carpeting to make sure that it is firmly  attached to the stairs. Fix any carpet that is loose or worn.  Avoid having throw rugs at the top or bottom of the stairs. If you do have throw rugs, attach them to the floor with carpet tape.  Make sure that you have a light switch at the top of the stairs and the bottom of the stairs. If you do not have them, ask someone to add them for you. What else can I do to help prevent falls?  Wear shoes that:  Do not have high heels.  Have rubber bottoms.  Are comfortable and fit you well.  Are closed at the toe. Do not wear sandals.  If you use a stepladder:  Make sure that it is fully opened. Do not climb a closed stepladder.  Make sure that both sides of the stepladder are locked into place.  Ask someone to hold it for you, if possible.  Clearly mark and make sure that you can see:  Any grab bars or handrails.  First and last steps.  Where the edge of each step is.  Use tools that help you move around (mobility aids) if they are needed. These include:  Canes.  Walkers.  Scooters.  Crutches.  Turn on the lights when you go into a dark area. Replace any light bulbs as soon as they burn out.  Set up your furniture so you have a clear path. Avoid moving your furniture around.  If any of your floors are uneven, fix them.  If there are any pets around you, be aware of where they are.  Review your medicines with your doctor. Some medicines can make you feel dizzy. This can increase your chance of falling. Ask your doctor what other things that you can do to help prevent falls. This information is not intended to replace advice given to you by your health care provider. Make sure you discuss any questions you have with your health care provider. Document Released: 12/18/2008 Document Revised: 07/30/2015 Document Reviewed: 03/28/2014 Elsevier Interactive Patient Education  2017 [employer].

## 2017-06-22 NOTE — Telephone Encounter (Signed)
[facility] reviewed. No red flags. Rx sent in

## 2017-06-22 NOTE — Patient Instructions (Signed)
Call insurance company about coverage for tetanus vaccine and Shingrix.  Health Maintenance, Female Adopting a healthy lifestyle and getting preventive care can go a long way to promote health and wellness. Talk with your health care provider about what schedule of regular examinations is right for you. This is a good chance for you to check in with your provider about disease prevention and staying healthy. In between checkups, there are plenty of things you can do on your own. Experts have done a lot of research about which lifestyle changes and preventive measures are most likely to keep you healthy. Ask your health care provider for more information. Weight and diet Eat a healthy diet  Be sure to include plenty of vegetables, fruits, low-fat dairy products, and lean protein.  Do not eat a lot of foods high in solid fats, added sugars, or salt.  Get regular exercise. This is one of the most important things you can do for your health. ? Most adults should exercise for at least 150 minutes each week. The exercise should increase your heart rate and make you sweat (moderate-intensity exercise). ? Most adults should also do strengthening exercises at least twice a week. This is in addition to the moderate-intensity exercise.  Maintain a healthy weight  Body mass index (BMI) is a measurement that can be used to identify possible weight problems. It estimates body fat based on height and weight. Your health care provider can help determine your BMI and help you achieve or maintain a healthy weight.  For females 4 years of age and older: ? A BMI below 18.5 is considered underweight. ? A BMI of 18.5 to 24.9 is normal. ? A BMI of 25 to 29.9 is considered overweight. ? A BMI of 30 and above is considered obese.  Watch levels of cholesterol and blood lipids  You should start having your blood tested for lipids and cholesterol at 60 years of age, then have this test every 5 years.  You may  need to have your cholesterol levels checked more often if: ? Your lipid or cholesterol levels are high. ? You are older than 60 years of age. ? You are at high risk for heart disease.  Cancer screening Lung Cancer  Lung cancer screening is recommended for adults 22-20 years old who are at high risk for lung cancer because of a history of smoking.  A yearly low-dose CT scan of the lungs is recommended for people who: ? Currently smoke. ? Have quit within the past 15 years. ? Have at least a 30-pack-year history of smoking. A pack year is smoking an average of one pack of cigarettes a day for 1 year.  Yearly screening should continue until it has been 15 years since you quit.  Yearly screening should stop if you develop a health problem that would prevent you from having lung cancer treatment.  Breast Cancer  Practice breast self-awareness. This means understanding how your breasts normally appear and feel.  It also means doing regular breast self-exams. Let your health care provider know about any changes, no matter how small.  If you are in your 20s or 30s, you should have a clinical breast exam (CBE) by a health care provider every 1-3 years as part of a regular health exam.  If you are 33 or older, have a CBE every year. Also consider having a breast X-ray (mammogram) every year.  If you have a family history of breast cancer, talk to your health care  provider about genetic screening.  If you are at high risk for breast cancer, talk to your health care provider about having an MRI and a mammogram every year.  Breast cancer gene (BRCA) assessment is recommended for women who have family members with BRCA-related cancers. BRCA-related cancers include: ? Breast. ? Ovarian. ? Tubal. ? Peritoneal cancers.  Results of the assessment will determine the need for genetic counseling and BRCA1 and BRCA2 testing.  Cervical Cancer Your health care provider may recommend that you be  screened regularly for cancer of the pelvic organs (ovaries, uterus, and vagina). This screening involves a pelvic examination, including checking for microscopic changes to the surface of your cervix (Pap test). You may be encouraged to have this screening done every 3 years, beginning at age 17.  For women ages 42-65, health care providers may recommend pelvic exams and Pap testing every 3 years, or they may recommend the Pap and pelvic exam, combined with testing for human papilloma virus (HPV), every 5 years. Some types of HPV increase your risk of cervical cancer. Testing for HPV may also be done on women of any age with unclear Pap test results.  Other health care providers may not recommend any screening for nonpregnant women who are considered low risk for pelvic cancer and who do not have symptoms. Ask your health care provider if a screening pelvic exam is right for you.  If you have had past treatment for cervical cancer or a condition that could lead to cancer, you need Pap tests and screening for cancer for at least 20 years after your treatment. If Pap tests have been discontinued, your risk factors (such as having a new sexual partner) need to be reassessed to determine if screening should resume. Some women have medical problems that increase the chance of getting cervical cancer. In these cases, your health care provider may recommend more frequent screening and Pap tests.  Colorectal Cancer  This type of cancer can be detected and often prevented.  Routine colorectal cancer screening usually begins at 60 years of age and continues through 60 years of age.  Your health care provider may recommend screening at an earlier age if you have risk factors for colon cancer.  Your health care provider may also recommend using home test kits to check for hidden blood in the stool.  A small camera at the end of a tube can be used to examine your colon directly (sigmoidoscopy or colonoscopy).  This is done to check for the earliest forms of colorectal cancer.  Routine screening usually begins at age 79.  Direct examination of the colon should be repeated every 5-10 years through 61 years of age. However, you may need to be screened more often if early forms of precancerous polyps or small growths are found.  Skin Cancer  Check your skin from head to toe regularly.  Tell your health care provider about any new moles or changes in moles, especially if there is a change in a mole's shape or color.  Also tell your health care provider if you have a mole that is larger than the size of a pencil eraser.  Always use sunscreen. Apply sunscreen liberally and repeatedly throughout the day.  Protect yourself by wearing long sleeves, pants, a wide-brimmed hat, and sunglasses whenever you are outside.  Heart disease, diabetes, and high blood pressure  High blood pressure causes heart disease and increases the risk of stroke. High blood pressure is more likely to develop in: ?  People who have blood pressure in the high end of the normal range (130-139/85-89 mm Hg). ? People who are overweight or obese. ? People who are African American.  If you are 52-23 years of age, have your blood pressure checked every 3-5 years. If you are 47 years of age or older, have your blood pressure checked every year. You should have your blood pressure measured twice-once when you are at a hospital or clinic, and once when you are not at a hospital or clinic. Record the average of the two measurements. To check your blood pressure when you are not at a hospital or clinic, you can use: ? An automated blood pressure machine at a pharmacy. ? A home blood pressure monitor.  If you are between 103 years and 32 years old, ask your health care provider if you should take aspirin to prevent strokes.  Have regular diabetes screenings. This involves taking a blood sample to check your fasting blood sugar level. ? If  you are at a normal weight and have a low risk for diabetes, have this test once every three years after 60 years of age. ? If you are overweight and have a high risk for diabetes, consider being tested at a younger age or more often. Preventing infection Hepatitis B  If you have a higher risk for hepatitis B, you should be screened for this virus. You are considered at high risk for hepatitis B if: ? You were born in a country where hepatitis B is common. Ask your health care provider which countries are considered high risk. ? Your parents were born in a high-risk country, and you have not been immunized against hepatitis B (hepatitis B vaccine). ? You have HIV or AIDS. ? You use needles to inject street drugs. ? You live with someone who has hepatitis B. ? You have had sex with someone who has hepatitis B. ? You get hemodialysis treatment. ? You take certain medicines for conditions, including cancer, organ transplantation, and autoimmune conditions.  Hepatitis C  Blood testing is recommended for: ? Everyone born from [birth year] through 1963. ? Anyone with known risk factors for hepatitis C.  Sexually transmitted infections (STIs)  You should be screened for sexually transmitted infections (STIs) including gonorrhea and chlamydia if: ? You are sexually active and are younger than 60 years of age. ? You are older than 60 years of age and your health care provider tells you that you are at risk for this type of infection. ? Your sexual activity has changed since you were last screened and you are at an increased risk for chlamydia or gonorrhea. Ask your health care provider if you are at risk.  If you do not have HIV, but are at risk, it may be recommended that you take a prescription medicine daily to prevent HIV infection. This is called pre-exposure prophylaxis (PrEP). You are considered at risk if: ? You are sexually active and do not regularly use condoms or know the HIV status of your  partner(s). ? You take drugs by injection. ? You are sexually active with a partner who has HIV.  Talk with your health care provider about whether you are at high risk of being infected with HIV. If you choose to begin PrEP, you should first be tested for HIV. You should then be tested every 3 months for as long as you are taking PrEP. Pregnancy  If you are premenopausal and you may become pregnant, ask your health  care provider about preconception counseling.  If you may become pregnant, take 400 to 800 micrograms (mcg) of folic acid every day.  If you want to prevent pregnancy, talk to your health care provider about birth control (contraception). Osteoporosis and menopause  Osteoporosis is a disease in which the bones lose minerals and strength with aging. This can result in serious bone fractures. Your risk for osteoporosis can be identified using a bone density scan.  If you are 65 years of age or older, or if you are at risk for osteoporosis and fractures, ask your health care provider if you should be screened.  Ask your health care provider whether you should take a calcium or vitamin D supplement to lower your risk for osteoporosis.  Menopause may have certain physical symptoms and risks.  Hormone replacement therapy may reduce some of these symptoms and risks. Talk to your health care provider about whether hormone replacement therapy is right for you. Follow these instructions at home:  Schedule regular health, dental, and eye exams.  Stay current with your immunizations.  Do not use any tobacco products including cigarettes, chewing tobacco, or electronic cigarettes.  If you are pregnant, do not drink alcohol.  If you are breastfeeding, limit how much and how often you drink alcohol.  Limit alcohol intake to no more than 1 drink per day for nonpregnant women. One drink equals 12 ounces of beer, 5 ounces of wine, or 1 ounces of hard liquor.  Do not use street  drugs.  Do not share needles.  Ask your health care provider for help if you need support or information about quitting drugs.  Tell your health care provider if you often feel depressed.  Tell your health care provider if you have ever been abused or do not feel safe at home. This information is not intended to replace advice given to you by your health care provider. Make sure you discuss any questions you have with your health care provider. Document Released: 09/06/2010 Document Revised: 07/30/2015 Document Reviewed: 11/25/2014 Elsevier Interactive Patient Education  2018 Elsevier Inc.  

## 2017-06-23 LAB — COMPREHENSIVE METABOLIC PANEL
A/G RATIO: 1.7 (ref 1.2–2.2)
ALBUMIN: 4.3 g/dL (ref 3.6–4.8)
ALT: 10 IU/L (ref 0–32)
AST: 14 IU/L (ref 0–40)
Alkaline Phosphatase: 55 IU/L (ref 39–117)
BUN / CREAT RATIO: 19 (ref 12–28)
BUN: 16 mg/dL (ref 8–27)
CALCIUM: 9.6 mg/dL (ref 8.7–10.3)
CHLORIDE: 105 mmol/L (ref 96–106)
CO2: 25 mmol/L (ref 20–29)
Creatinine, Ser: 0.84 mg/dL (ref 0.57–1.00)
GFR, EST AFRICAN AMERICAN: 87 mL/min/{1.73_m2} (ref >59)
GFR, EST NON AFRICAN AMERICAN: 76 mL/min/{1.73_m2} (ref >59)
Globulin, Total: 2.5 g/dL (ref 1.5–4.5)
Glucose: 89 mg/dL (ref 65–99)
POTASSIUM: 4.3 mmol/L (ref 3.5–5.2)
Sodium: 141 mmol/L (ref 134–144)
TOTAL PROTEIN: 6.8 g/dL (ref 6.0–8.5)

## 2017-06-23 LAB — CBC WITH DIFFERENTIAL/PLATELET
BASOS: 1 %
Basophils Absolute: 0 10*3/uL (ref 0.0–0.2)
EOS (ABSOLUTE): 0.1 10*3/uL (ref 0.0–0.4)
EOS: 2 %
HEMOGLOBIN: 11 g/dL — AB (ref 11.1–15.9)
Hematocrit: 34.9 % (ref 34.0–46.6)
IMMATURE GRANS (ABS): 0 10*3/uL (ref 0.0–0.1)
Immature Granulocytes: 0 %
LYMPHS: 52 %
Lymphocytes Absolute: 3.2 10*3/uL — ABNORMAL HIGH (ref 0.7–3.1)
MCH: 27.9 pg (ref 26.6–33.0)
MCHC: 31.5 g/dL (ref 31.5–35.7)
MCV: 89 fL (ref 79–97)
MONOCYTES: 7 %
Monocytes Absolute: 0.4 10*3/uL (ref 0.1–0.9)
NEUTROS ABS: 2.3 10*3/uL (ref 1.4–7.0)
Neutrophils: 38 %
Platelets: 338 10*3/uL (ref 150–379)
RBC: 3.94 x10E6/uL (ref 3.77–5.28)
RDW: 15.9 % — ABNORMAL HIGH (ref 12.3–15.4)
WBC: 6 10*3/uL (ref 3.4–10.8)

## 2017-07-11 DIAGNOSIS — F332 Major depressive disorder, recurrent severe without psychotic features: Secondary | ICD-10-CM | POA: Diagnosis not present

## 2017-07-11 DIAGNOSIS — Z79899 Other long term (current) drug therapy: Secondary | ICD-10-CM | POA: Diagnosis not present

## 2017-07-13 ENCOUNTER — Ambulatory Visit (INDEPENDENT_AMBULATORY_CARE_PROVIDER_SITE_OTHER): Payer: Medicare Other | Admitting: Physician Assistant

## 2017-07-13 ENCOUNTER — Other Ambulatory Visit: Payer: Self-pay

## 2017-07-13 ENCOUNTER — Encounter: Payer: Self-pay | Admitting: Physician Assistant

## 2017-07-13 VITALS — BP 132/80 | HR 79 | Wt 131.0 lb

## 2017-07-13 DIAGNOSIS — F1190 Opioid use, unspecified, uncomplicated: Secondary | ICD-10-CM | POA: Diagnosis not present

## 2017-07-13 DIAGNOSIS — G8929 Other chronic pain: Secondary | ICD-10-CM | POA: Diagnosis not present

## 2017-07-13 DIAGNOSIS — G894 Chronic pain syndrome: Secondary | ICD-10-CM

## 2017-07-13 DIAGNOSIS — M5116 Intervertebral disc disorders with radiculopathy, lumbar region: Secondary | ICD-10-CM | POA: Diagnosis not present

## 2017-07-13 NOTE — Progress Notes (Signed)
       Patient: Patricia Decker Female    DOB: [birth year]   62 y.o.   MRN: [id number] Visit Date: 07/13/2017  Today's Provider: Mar Daring, PA-C   Chief Complaint  Patient presents with  . Drug / Alcohol Assessment   Subjective:    Drug / Alcohol Assessment   Patient here for UDS only for chronic narcotic use. She uses Hydrocodone-APAP 10-325mg  q 8 hrs prn for chronic back pain secondary to radiculitis from rupture of lumbar disc. NCCSR is reviewed with every prescription and there has never been any red flags or signs of misuse/abuse.      Allergies  Allergen Reactions  . Penicillins   . Sulfa Antibiotics     Indigestion     Current Outpatient Medications:  .  ALPRAZolam (XANAX) 1 MG tablet, Take 2 tablets by mouth 3 (three) times daily. , Disp: , Rfl:  .  ARIPIPRAZOLE PO, Take 1 tablet by mouth daily. , Disp: , Rfl:  .  Besifloxacin HCl (BESIVANCE) 0.6 % SUSP, Besivance 0.6 % eye drops,suspension, Disp: , Rfl:  .  Difluprednate (DUREZOL) 0.05 % EMUL, Durezol 0.05 % eye drops, Disp: , Rfl:  .  estradiol (ESTRACE) 1 MG tablet, Take 1 tablet (1 mg total) by mouth daily. (Patient taking differently: Take 1 mg by mouth daily. ), Disp: 90 tablet, Rfl: 1 .  HYDROcodone-acetaminophen (NORCO) 10-325 MG tablet, TAKE 1 TABLET BY MOUTH EVERY 8 HOURS AS NEEDED SEVERE PAIN, Disp: 60 tablet, Rfl: 0 .  venlafaxine XR (EFFEXOR XR) 150 MG 24 hr capsule, Take 2 capsules by mouth daily. , Disp: , Rfl:   Review of Systems  Constitutional: Negative.   Respiratory: Negative.   Cardiovascular: Negative.   Musculoskeletal: Positive for back pain.  Neurological: Positive for numbness.    Social History   Tobacco Use  . Smoking status: Never Smoker  . Smokeless tobacco: Never Used  Substance Use Topics  . Alcohol use: No   Objective:   BP 132/80 (BP Location: Right Arm, Patient Position: Sitting, Cuff Size: Normal)   Pulse 79   Wt 131 lb (59.4 kg)   SpO2 98%   BMI 21.80 kg/m    Vitals:   07/13/17 0809  BP: 132/80  Pulse: 79  SpO2: 98%  Weight: 131 lb (59.4 kg)     Physical Exam  Constitutional: She is oriented to person, place, and time. She appears well-developed and well-nourished.  HENT:  Head: Normocephalic and atraumatic.  Eyes: Pupils are equal, round, and reactive to light. Conjunctivae and EOM are normal.  Neck: Normal range of motion. Neck supple.  Neurological: She is alert and oriented to person, place, and time.  Psychiatric: She has a normal mood and affect. Her behavior is normal. Judgment and thought content normal.        Assessment & Plan:     1. Chronic narcotic use Urine collected today for yearly UDS for chronic pain management.  - Pain Mgt Scrn (14 Drugs), Ur  2. Chronic pain syndrome - Pain Mgt Scrn (14 Drugs), Ur  3. Encounter for chronic pain management - Pain Mgt Scrn (14 Drugs), Ur  4. Neuritis or radiculitis due to rupture of lumbar intervertebral disc - Pain Mgt Scrn (14 Drugs), Ur       Mar Daring, PA-C  [place]

## 2017-07-14 LAB — PAIN MGT SCRN (14 DRUGS), UR
AMPHETAMINE SCREEN URINE: NEGATIVE ng/mL
BARBITURATE SCREEN URINE: NEGATIVE ng/mL
BENZODIAZEPINE SCREEN, URINE: NEGATIVE ng/mL
Buprenorphine, Urine: NEGATIVE ng/mL
CANNABINOIDS UR QL SCN: NEGATIVE ng/mL
Cocaine (Metab) Scrn, Ur: NEGATIVE ng/mL
Creatinine(Crt), U: 27.9 mg/dL (ref 20.0–300.0)
FENTANYL, URINE: NEGATIVE pg/mL
MEPERIDINE SCREEN, URINE: NEGATIVE ng/mL
Methadone Screen, Urine: NEGATIVE ng/mL
OPIATE SCREEN URINE: NEGATIVE ng/mL
OXYCODONE+OXYMORPHONE UR QL SCN: NEGATIVE ng/mL
PH UR, DRUG SCRN: 5.5 (ref 4.5–8.9)
Phencyclidine Qn, Ur: NEGATIVE ng/mL
Propoxyphene Scrn, Ur: NEGATIVE ng/mL
Tramadol Screen, Urine: NEGATIVE ng/mL

## 2017-07-18 ENCOUNTER — Telehealth: Payer: Self-pay

## 2017-07-18 NOTE — Telephone Encounter (Signed)
Ok perfect. I am ok with that. No need to recheck urine

## 2017-07-18 NOTE — Telephone Encounter (Signed)
-----   Message from Mar Daring, [place] sent at 07/17/2017  2:25 PM EDT ----- Can we see when patient last took a pain pill prior to her urine testing? Her urine test was negative for everything. This should not have been the case, but I know she does not take her medication daily. I just wanted to see if she had taken one that day. If she says yes, we will have to recheck her urine again. If it is still negative we will not be able to fill it any longer.

## 2017-07-18 NOTE — Telephone Encounter (Signed)
Patient reports she had not taken medication for about 3 days.

## 2017-07-26 ENCOUNTER — Other Ambulatory Visit: Payer: Self-pay | Admitting: Physician Assistant

## 2017-07-26 DIAGNOSIS — M1990 Unspecified osteoarthritis, unspecified site: Secondary | ICD-10-CM

## 2017-07-26 MED ORDER — HYDROCODONE-ACETAMINOPHEN 10-325 MG PO TABS: ORAL_TABLET | ORAL | 0 refills | Status: DC

## 2017-07-26 NOTE — Telephone Encounter (Signed)
Pt contacted office for refill request on the following medications:  HYDROcodone-acetaminophen (NORCO) 10-325 MG tablet   [facility]  Last Rx: 06/22/17 LOV: 07/13/17 Please advise. Thanks TNP

## 2017-07-31 DIAGNOSIS — H2512 Age-related nuclear cataract, left eye: Secondary | ICD-10-CM | POA: Diagnosis not present

## 2017-07-31 DIAGNOSIS — H25013 Cortical age-related cataract, bilateral: Secondary | ICD-10-CM | POA: Diagnosis not present

## 2017-08-01 DIAGNOSIS — H25011 Cortical age-related cataract, right eye: Secondary | ICD-10-CM | POA: Diagnosis not present

## 2017-08-10 ENCOUNTER — Other Ambulatory Visit: Payer: Self-pay | Admitting: Physician Assistant

## 2017-08-10 DIAGNOSIS — Z1231 Encounter for screening mammogram for malignant neoplasm of breast: Secondary | ICD-10-CM

## 2017-08-14 DIAGNOSIS — H2511 Age-related nuclear cataract, right eye: Secondary | ICD-10-CM | POA: Diagnosis not present

## 2017-08-14 DIAGNOSIS — H25011 Cortical age-related cataract, right eye: Secondary | ICD-10-CM | POA: Diagnosis not present

## 2017-08-21 DIAGNOSIS — F330 Major depressive disorder, recurrent, mild: Secondary | ICD-10-CM | POA: Diagnosis not present

## 2017-08-21 DIAGNOSIS — Z79899 Other long term (current) drug therapy: Secondary | ICD-10-CM | POA: Diagnosis not present

## 2017-08-23 ENCOUNTER — Other Ambulatory Visit: Payer: Self-pay

## 2017-08-23 DIAGNOSIS — M1990 Unspecified osteoarthritis, unspecified site: Secondary | ICD-10-CM

## 2017-08-23 NOTE — Telephone Encounter (Signed)
Patient requesting refills on the following medication: HYDROcodone-acetaminophen (NORCO) 10-325 MG tablet   To [place]

## 2017-08-24 MED ORDER — HYDROCODONE-ACETAMINOPHEN 10-325 MG PO TABS: ORAL_TABLET | ORAL | 0 refills | Status: DC

## 2017-08-24 NOTE — Telephone Encounter (Signed)
NCCSR reviewed. 

## 2017-09-28 ENCOUNTER — Telehealth: Payer: Self-pay

## 2017-09-28 DIAGNOSIS — M1990 Unspecified osteoarthritis, unspecified site: Secondary | ICD-10-CM

## 2017-09-28 MED ORDER — HYDROCODONE-ACETAMINOPHEN 10-325 MG PO TABS: ORAL_TABLET | ORAL | 0 refills | Status: DC

## 2017-09-28 NOTE — Telephone Encounter (Signed)
Patient is requesting a refill on HYDROcodone-acetaminophen (NORCO) 10-325 MG tablet be sent to [facility]. Last Rf was 08/24/17. Last OV was 07/13/17. CB# [phone]

## 2017-09-28 NOTE — Telephone Encounter (Signed)
NCCSR reviewed. 

## 2017-10-26 ENCOUNTER — Other Ambulatory Visit: Payer: Self-pay | Admitting: Physician Assistant

## 2017-10-26 DIAGNOSIS — M1990 Unspecified osteoarthritis, unspecified site: Secondary | ICD-10-CM

## 2017-10-26 MED ORDER — HYDROCODONE-ACETAMINOPHEN 10-325 MG PO TABS: ORAL_TABLET | ORAL | 0 refills | Status: DC

## 2017-10-26 NOTE — Telephone Encounter (Signed)
Pt needs refill on her hydrocodone 10-325  She uses [facility]  Thanks teri

## 2017-10-26 NOTE — Telephone Encounter (Signed)
NCCSR reviewed. 

## 2017-11-28 ENCOUNTER — Other Ambulatory Visit: Payer: Self-pay | Admitting: Physician Assistant

## 2017-11-28 DIAGNOSIS — M1990 Unspecified osteoarthritis, unspecified site: Secondary | ICD-10-CM

## 2017-11-28 MED ORDER — HYDROCODONE-ACETAMINOPHEN 10-325 MG PO TABS: ORAL_TABLET | ORAL | 0 refills | Status: DC

## 2017-11-28 NOTE — Telephone Encounter (Signed)
NCCSR reviewed. 

## 2017-11-28 NOTE — Telephone Encounter (Signed)
Needs refill on   Hydrocodone 10-325  Patricia Decker  CB#  [phone]  Thanks Con Memos

## 2017-12-13 DIAGNOSIS — H43811 Vitreous degeneration, right eye: Secondary | ICD-10-CM | POA: Diagnosis not present

## 2017-12-13 DIAGNOSIS — H35461 Secondary vitreoretinal degeneration, right eye: Secondary | ICD-10-CM | POA: Diagnosis not present

## 2017-12-25 DIAGNOSIS — F410 Panic disorder [episodic paroxysmal anxiety] without agoraphobia: Secondary | ICD-10-CM | POA: Diagnosis not present

## 2017-12-25 DIAGNOSIS — Z79899 Other long term (current) drug therapy: Secondary | ICD-10-CM | POA: Diagnosis not present

## 2017-12-28 ENCOUNTER — Telehealth: Payer: Self-pay | Admitting: Physician Assistant

## 2017-12-28 DIAGNOSIS — M1990 Unspecified osteoarthritis, unspecified site: Secondary | ICD-10-CM

## 2017-12-28 NOTE — Telephone Encounter (Signed)
This is Jenni's patient.

## 2017-12-28 NOTE — Telephone Encounter (Signed)
Pt needing a refill on:   HYDROcodone-acetaminophen (NORCO) 10-325 MG tablet   Please fill at:  [facility], [place] - [address]. [phone] (Phone) [phone] (Fax)    Thanks, [employer]

## 2017-12-29 MED ORDER — HYDROCODONE-ACETAMINOPHEN 10-325 MG PO TABS: ORAL_TABLET | ORAL | 0 refills | Status: DC

## 2018-01-26 ENCOUNTER — Other Ambulatory Visit: Payer: Self-pay

## 2018-01-26 DIAGNOSIS — M1990 Unspecified osteoarthritis, unspecified site: Secondary | ICD-10-CM

## 2018-01-26 MED ORDER — HYDROCODONE-ACETAMINOPHEN 10-325 MG PO TABS: ORAL_TABLET | ORAL | 0 refills | Status: DC

## 2018-01-26 NOTE — Telephone Encounter (Signed)
NCCSR reviewed. 

## 2018-01-26 NOTE — Telephone Encounter (Signed)
Patient called requesting refills on medication. Pharmacy listed is correct. Thanks!

## 2018-02-07 DIAGNOSIS — M7072 Other bursitis of hip, left hip: Secondary | ICD-10-CM | POA: Diagnosis not present

## 2018-02-22 ENCOUNTER — Other Ambulatory Visit: Payer: Self-pay

## 2018-02-22 DIAGNOSIS — M1990 Unspecified osteoarthritis, unspecified site: Secondary | ICD-10-CM

## 2018-02-22 NOTE — Telephone Encounter (Signed)
Patient had called office requesting refill on Hydrocodone-Acetaminophen, patient was advised that Tawanna Sat is out of the office for the day.

## 2018-02-23 MED ORDER — HYDROCODONE-ACETAMINOPHEN 10-325 MG PO TABS: ORAL_TABLET | ORAL | 0 refills | Status: DC

## 2018-02-23 NOTE — Telephone Encounter (Signed)
Please advise 

## 2018-03-14 ENCOUNTER — Other Ambulatory Visit: Payer: Self-pay | Admitting: Physician Assistant

## 2018-03-14 DIAGNOSIS — M1990 Unspecified osteoarthritis, unspecified site: Secondary | ICD-10-CM

## 2018-03-14 NOTE — Telephone Encounter (Signed)
NCCSR reviewed. Rx sent in. 

## 2018-03-21 DIAGNOSIS — H52223 Regular astigmatism, bilateral: Secondary | ICD-10-CM | POA: Diagnosis not present

## 2018-03-21 DIAGNOSIS — H04223 Epiphora due to insufficient drainage, bilateral lacrimal glands: Secondary | ICD-10-CM | POA: Diagnosis not present

## 2018-03-21 DIAGNOSIS — H43813 Vitreous degeneration, bilateral: Secondary | ICD-10-CM | POA: Diagnosis not present

## 2018-03-21 DIAGNOSIS — H35011 Changes in retinal vascular appearance, right eye: Secondary | ICD-10-CM | POA: Diagnosis not present

## 2018-03-28 DIAGNOSIS — M4696 Unspecified inflammatory spondylopathy, lumbar region: Secondary | ICD-10-CM | POA: Diagnosis not present

## 2018-03-30 ENCOUNTER — Other Ambulatory Visit: Payer: Self-pay

## 2018-03-30 DIAGNOSIS — M1990 Unspecified osteoarthritis, unspecified site: Secondary | ICD-10-CM

## 2018-03-30 MED ORDER — HYDROCODONE-ACETAMINOPHEN 10-325 MG PO TABS: 1.0000 | ORAL_TABLET | Freq: Three times a day (TID) | ORAL | 0 refills | Status: DC | PRN

## 2018-03-30 NOTE — Telephone Encounter (Signed)
Patient called office requesting refill on Hyrdrocodone-Acetaminophen sent to [facility]. KW

## 2018-03-30 NOTE — Telephone Encounter (Signed)
NCCSR reviewed. 

## 2018-04-04 ENCOUNTER — Telehealth: Payer: Self-pay

## 2018-04-04 ENCOUNTER — Ambulatory Visit
Admission: RE | Admit: 2018-04-04 | Discharge: 2018-04-04 | Disposition: A | Discharge status: Home / Self Care | Payer: Medicare Other | Source: Ambulatory Visit | Attending: Physician Assistant | Admitting: Physician Assistant

## 2018-04-04 DIAGNOSIS — Z1231 Encounter for screening mammogram for malignant neoplasm of breast: Secondary | ICD-10-CM

## 2018-04-04 NOTE — Telephone Encounter (Signed)
Patient was advised.  

## 2018-04-04 NOTE — Telephone Encounter (Signed)
-----   Message from Mar Daring, [place] sent at 04/04/2018 10:59 AM EST ----- Normal mammogram. Repeat screening in one year.

## 2018-04-20 ENCOUNTER — Other Ambulatory Visit: Payer: Self-pay | Admitting: Physician Assistant

## 2018-04-20 DIAGNOSIS — N951 Menopausal and female climacteric states: Secondary | ICD-10-CM

## 2018-04-20 MED ORDER — ESTRADIOL 1 MG PO TABS: 1.0000 mg | ORAL_TABLET | Freq: Every day | ORAL | 1 refills | Status: DC

## 2018-04-20 NOTE — Telephone Encounter (Signed)
L.O.V. was 12/28/2017, please advise.

## 2018-04-20 NOTE — Telephone Encounter (Signed)
Pt needing a refill on: estradiol (ESTRACE) 1 MG tablet  Please call into:  [facility], [place] [address]. [phone] (Phone) [phone] (Fax)   Thanks, [employer]

## 2018-04-30 ENCOUNTER — Other Ambulatory Visit: Payer: Self-pay | Admitting: Physician Assistant

## 2018-04-30 DIAGNOSIS — M1990 Unspecified osteoarthritis, unspecified site: Secondary | ICD-10-CM

## 2018-04-30 MED ORDER — HYDROCODONE-ACETAMINOPHEN 10-325 MG PO TABS: 1.0000 | ORAL_TABLET | Freq: Three times a day (TID) | ORAL | 0 refills | Status: DC | PRN

## 2018-04-30 NOTE — Telephone Encounter (Signed)
Pt needs refill on her  Hydrocodone 10-325  [facility]

## 2018-05-29 ENCOUNTER — Other Ambulatory Visit: Payer: Self-pay

## 2018-05-29 DIAGNOSIS — M1990 Unspecified osteoarthritis, unspecified site: Secondary | ICD-10-CM

## 2018-05-29 NOTE — Telephone Encounter (Signed)
Patient called needing refills. Pharmacy listed is correct. Thanks!

## 2018-05-30 MED ORDER — HYDROCODONE-ACETAMINOPHEN 10-325 MG PO TABS: 1.0000 | ORAL_TABLET | Freq: Three times a day (TID) | ORAL | 0 refills | Status: DC | PRN

## 2018-05-31 ENCOUNTER — Telehealth: Payer: Self-pay | Admitting: Physician Assistant

## 2018-05-31 DIAGNOSIS — F411 Generalized anxiety disorder: Secondary | ICD-10-CM

## 2018-05-31 DIAGNOSIS — F339 Major depressive disorder, recurrent, unspecified: Secondary | ICD-10-CM

## 2018-05-31 NOTE — Telephone Encounter (Signed)
Please review. KW 

## 2018-05-31 NOTE — Telephone Encounter (Signed)
Pt states that her psychiatrist cannot prescribe her Alprazolam 1 mg for her any longer.  She wants to know if Tawanna Sat can prescribe this for her now.  She said he changed her to another medication and she really doesn't want to change medications at this time.  CB#  [phone]     Thanks Con Memos

## 2018-06-01 MED ORDER — ALPRAZOLAM 1 MG PO TABS: 1.0000 mg | ORAL_TABLET | Freq: Three times a day (TID) | ORAL | 5 refills | Status: DC | PRN

## 2018-06-01 NOTE — Telephone Encounter (Signed)
Patient advised as below. Patient reports that for some reason the psychiatrist can no longer prescribe any medications. That is license is revoked. Patient wanted to know if you could prescribe her medication.

## 2018-06-01 NOTE — Telephone Encounter (Signed)
Patient uses [facility]

## 2018-06-01 NOTE — Telephone Encounter (Signed)
I would only give one month of 1mg  TID (this is half her dose) and try to establish her with a new psychiatrist

## 2018-06-01 NOTE — Addendum Note (Signed)
Addended by: Mar Daring on: 06/01/2018 01:26 PM   Modules accepted: Orders

## 2018-06-01 NOTE — Telephone Encounter (Signed)
Please advise 

## 2018-06-01 NOTE — Telephone Encounter (Signed)
I do not prescribe that high amounts of alprazolam unfortunately. I would recommend her to follow recommendations of her psychiatrist and be up front with her psychiatrist if new medications are not working effectively.

## 2018-06-01 NOTE — Telephone Encounter (Signed)
Patient advised as below. Patient verbalizes understanding and is in agreement with treatment plan.  

## 2018-06-01 NOTE — Telephone Encounter (Signed)
Sent to [facility] and new referral placed.

## 2018-06-19 ENCOUNTER — Telehealth: Payer: Self-pay | Admitting: Physician Assistant

## 2018-06-19 NOTE — Telephone Encounter (Signed)
We received a fax from after hours service that patient is returning your call and requesting a call back.

## 2018-06-26 ENCOUNTER — Other Ambulatory Visit: Payer: Self-pay

## 2018-06-26 ENCOUNTER — Ambulatory Visit: Payer: Self-pay

## 2018-06-26 ENCOUNTER — Encounter: Payer: Self-pay | Admitting: Physician Assistant

## 2018-06-26 DIAGNOSIS — M1990 Unspecified osteoarthritis, unspecified site: Secondary | ICD-10-CM

## 2018-06-26 NOTE — Telephone Encounter (Signed)
Pt was already r/s for AWV in July. Pt would like to complete telephonic AWV. Scheduled for 07/04/18 @ 10 AM. -MM

## 2018-06-26 NOTE — Telephone Encounter (Signed)
Patient requesting refills. Thanks!

## 2018-06-27 MED ORDER — HYDROCODONE-ACETAMINOPHEN 10-325 MG PO TABS: 1.0000 | ORAL_TABLET | Freq: Three times a day (TID) | ORAL | 0 refills | Status: DC | PRN

## 2018-06-29 ENCOUNTER — Encounter: Payer: Medicare Other | Admitting: Physician Assistant

## 2018-07-04 ENCOUNTER — Ambulatory Visit (INDEPENDENT_AMBULATORY_CARE_PROVIDER_SITE_OTHER): Payer: Medicare Other

## 2018-07-04 ENCOUNTER — Other Ambulatory Visit: Payer: Self-pay

## 2018-07-04 DIAGNOSIS — Z0000 Encounter for general adult medical examination without abnormal findings: Secondary | ICD-10-CM

## 2018-07-04 NOTE — Patient Instructions (Addendum)
Patricia Decker , Thank you for taking time to come for your Medicare Wellness Visit. I appreciate your ongoing commitment to your health goals. Please review the following plan we discussed and let me know if I can assist you in the future.   Screening recommendations/referrals: Colonoscopy: Up to date, due 06/2026 Mammogram: Up to date, due 03/2020 Bone Density: Not required until age 61. Recommended yearly ophthalmology/optometry visit for glaucoma screening and checkup Recommended yearly dental visit for hygiene and checkup  Vaccinations: Influenza vaccine: Due fall 2020 Pneumococcal vaccine: Not required until age 68 Tdap vaccine: Pt declines today.  Shingles vaccine: Pt declines today.     Advanced directives: Advance directive discussed with you today. Even though you declined this today please call our office should you change your mind and we can give you the proper paperwork for you to fill out.  Conditions/risks identified: Recommend to drink at least 6-8 8oz glasses of water per day.  Next appointment: 10/03/18 @ 2:00 PM with Fenton Malling.  Preventive Care 40-64 Years, Female Preventive care refers to lifestyle choices and visits with your health care provider that can promote health and wellness. What does preventive care include?  A yearly physical exam. This is also called an annual well check.  Dental exams once or twice a year.  Routine eye exams. Ask your health care provider how often you should have your eyes checked.  Personal lifestyle choices, including:  Daily care of your teeth and gums.  Regular physical activity.  Eating a healthy diet.  Avoiding tobacco and drug use.  Limiting alcohol use.  Practicing safe sex.  Taking low-dose aspirin daily starting at age 19.  Taking vitamin and mineral supplements as recommended by your health care provider. What happens during an annual well check? The services and screenings done by your health care  provider during your annual well check will depend on your age, overall health, lifestyle risk factors, and family history of disease. Counseling  Your health care provider may ask you questions about your:  Alcohol use.  Tobacco use.  Drug use.  Emotional well-being.  Home and relationship well-being.  Sexual activity.  Eating habits.  Work and work Statistician.  Method of birth control.  Menstrual cycle.  Pregnancy history. Screening  You may have the following tests or measurements:  Height, weight, and BMI.  Blood pressure.  Lipid and cholesterol levels. These may be checked every 5 years, or more frequently if you are over 77 years old.  Skin check.  Lung cancer screening. You may have this screening every year starting at age 23 if you have a 30-pack-year history of smoking and currently smoke or have quit within the past 15 years.  Fecal occult blood test (FOBT) of the stool. You may have this test every year starting at age 63.  Flexible sigmoidoscopy or colonoscopy. You may have a sigmoidoscopy every 5 years or a colonoscopy every 10 years starting at age 51.  Hepatitis C blood test.  Hepatitis B blood test.  Sexually transmitted disease (STD) testing.  Diabetes screening. This is done by checking your blood sugar (glucose) after you have not eaten for a while (fasting). You may have this done every 1-3 years.  Mammogram. This may be done every 1-2 years. Talk to your health care provider about when you should start having regular mammograms. This may depend on whether you have a family history of breast cancer.  BRCA-related cancer screening. This may be done if you have  a family history of breast, ovarian, tubal, or peritoneal cancers.  Pelvic exam and Pap test. This may be done every 3 years starting at age 77. Starting at age 58, this may be done every 5 years if you have a Pap test in combination with an HPV test.  Bone density scan. This is done  to screen for osteoporosis. You may have this scan if you are at high risk for osteoporosis. Discuss your test results, treatment options, and if necessary, the need for more tests with your health care provider. Vaccines  Your health care provider may recommend certain vaccines, such as:  Influenza vaccine. This is recommended every year.  Tetanus, diphtheria, and acellular pertussis (Tdap, Td) vaccine. You may need a Td booster every 10 years.  Zoster vaccine. You may need this after age 87.  Pneumococcal 13-valent conjugate (PCV13) vaccine. You may need this if you have certain conditions and were not previously vaccinated.  Pneumococcal polysaccharide (PPSV23) vaccine. You may need one or two doses if you smoke cigarettes or if you have certain conditions. Talk to your health care provider about which screenings and vaccines you need and how often you need them. This information is not intended to replace advice given to you by your health care provider. Make sure you discuss any questions you have with your health care provider. Document Released: 03/20/2015 Document Revised: 11/11/2015 Document Reviewed: 12/23/2014 Elsevier Interactive Patient Education  2017 [place] Prevention in the Home Falls can cause injuries. They can happen to people of all ages. There are many things you can do to make your home safe and to help prevent falls. What can I do on the outside of my home?  Regularly fix the edges of walkways and driveways and fix any cracks.  Remove anything that might make you trip as you walk through a door, such as a raised step or threshold.  Trim any bushes or trees on the path to your home.  Use bright outdoor lighting.  Clear any walking paths of anything that might make someone trip, such as rocks or tools.  Regularly check to see if handrails are loose or broken. Make sure that both sides of any steps have handrails.  Any raised decks and porches  should have guardrails on the edges.  Have any leaves, snow, or ice cleared regularly.  Use sand or salt on walking paths during winter.  Clean up any spills in your garage right away. This includes oil or grease spills. What can I do in the bathroom?  Use night lights.  Install grab bars by the toilet and in the tub and shower. Do not use towel bars as grab bars.  Use non-skid mats or decals in the tub or shower.  If you need to sit down in the shower, use a plastic, non-slip stool.  Keep the floor dry. Clean up any water that spills on the floor as soon as it happens.  Remove soap buildup in the tub or shower regularly.  Attach bath mats securely with double-sided non-slip rug tape.  Do not have throw rugs and other things on the floor that can make you trip. What can I do in the bedroom?  Use night lights.  Make sure that you have a light by your bed that is easy to reach.  Do not use any sheets or blankets that are too big for your bed. They should not hang down onto the floor.  Have a  firm chair that has side arms. You can use this for support while you get dressed.  Do not have throw rugs and other things on the floor that can make you trip. What can I do in the kitchen?  Clean up any spills right away.  Avoid walking on wet floors.  Keep items that you use a lot in easy-to-reach places.  If you need to reach something above you, use a strong step stool that has a grab bar.  Keep electrical cords out of the way.  Do not use floor polish or wax that makes floors slippery. If you must use wax, use non-skid floor wax.  Do not have throw rugs and other things on the floor that can make you trip. What can I do with my stairs?  Do not leave any items on the stairs.  Make sure that there are handrails on both sides of the stairs and use them. Fix handrails that are broken or loose. Make sure that handrails are as long as the stairways.  Check any carpeting to  make sure that it is firmly attached to the stairs. Fix any carpet that is loose or worn.  Avoid having throw rugs at the top or bottom of the stairs. If you do have throw rugs, attach them to the floor with carpet tape.  Make sure that you have a light switch at the top of the stairs and the bottom of the stairs. If you do not have them, ask someone to add them for you. What else can I do to help prevent falls?  Wear shoes that:  Do not have high heels.  Have rubber bottoms.  Are comfortable and fit you well.  Are closed at the toe. Do not wear sandals.  If you use a stepladder:  Make sure that it is fully opened. Do not climb a closed stepladder.  Make sure that both sides of the stepladder are locked into place.  Ask someone to hold it for you, if possible.  Clearly mark and make sure that you can see:  Any grab bars or handrails.  First and last steps.  Where the edge of each step is.  Use tools that help you move around (mobility aids) if they are needed. These include:  Canes.  Walkers.  Scooters.  Crutches.  Turn on the lights when you go into a dark area. Replace any light bulbs as soon as they burn out.  Set up your furniture so you have a clear path. Avoid moving your furniture around.  If any of your floors are uneven, fix them.  If there are any pets around you, be aware of where they are.  Review your medicines with your doctor. Some medicines can make you feel dizzy. This can increase your chance of falling. Ask your doctor what other things that you can do to help prevent falls. This information is not intended to replace advice given to you by your health care provider. Make sure you discuss any questions you have with your health care provider. Document Released: 12/18/2008 Document Revised: 07/30/2015 Document Reviewed: 03/28/2014 Elsevier Interactive Patient Education  2017 [employer].

## 2018-07-04 NOTE — Progress Notes (Signed)
Subjective:   Patricia Decker is a 63 y.o. female who presents for Medicare Annual (Subsequent) preventive examination.    This visit is being conducted through telemedicine due to the COVID-19 pandemic. This patient has given me verbal consent via doximity to conduct this visit, patient states they are participating from their home address. Some vital signs may be absent or patient reported.    Patient identification: identified by name, DOB, and current address  Review of Systems:  N/A  Cardiac Risk Factors include: none     Objective:     Vitals: There were no vitals taken for this visit.  There is no height or weight on file to calculate BMI. Unable to obtain vitals due to visit being conducted via telephonically.   Advanced Directives 07/04/2018 06/22/2017 06/30/2016 06/11/2015  Does Patient Have a Medical Advance Directive? No No No No  Would patient like information on creating a medical advance directive? No - Patient declined Yes (MAU/Ambulatory/Procedural Areas - Information given) - -    Tobacco Social History   Tobacco Use  Smoking Status Never Smoker  Smokeless Tobacco Never Used     Counseling given: Not Answered   Clinical Intake:  Pre-visit preparation completed: Yes  Pain : No/denies pain Pain Score: 0-No pain     Nutritional Status: BMI of 19-24  Normal Nutritional Risks: None Diabetes: No  How often do you need to have someone help you when you read instructions, pamphlets, or other written materials from your doctor or pharmacy?: 1 - Never  Interpreter Needed?: No  Information entered by :: [facility], LPN  Past Medical History:  Diagnosis Date  . Anemia   . Anxiety   . Arthritis   . Asthma   . Cataract    removed  . Depression    Past Surgical History:  Procedure Laterality Date  . ABDOMINAL HYSTERECTOMY    . APPENDECTOMY    . CATARACT EXTRACTION, BILATERAL    . CHOLECYSTECTOMY    . COLONOSCOPY WITH PROPOFOL N/A 06/30/2016   Procedure: COLONOSCOPY WITH PROPOFOL;  Surgeon: Jonathon Bellows, MD;  Location: [facility];  Service: Endoscopy;  Laterality: N/A;   Family History  Problem Relation Age of Onset  . Alzheimer's disease Sister   . Breast cancer Sister 24   Social History   Socioeconomic History  . Marital status: Widowed    Spouse name: Not on file  . Number of children: 1  . Years of education: Not on file  . Highest education level: Some college, no degree  Occupational History  . Occupation: disability  Social Needs  . Financial resource strain: Not hard at all  . Food insecurity:    Worry: Never true    Inability: Never true  . Transportation needs:    Medical: No    Non-medical: No  Tobacco Use  . Smoking status: Never Smoker  . Smokeless tobacco: Never Used  Substance and Sexual Activity  . Alcohol use: No  . Drug use: No  . Sexual activity: Not on file  Lifestyle  . Physical activity:    Days per week: 0 days    Minutes per session: 0 min  . Stress: To some extent  Relationships  . Social connections:    Talks on phone: Patient refused    Gets together: Patient refused    Attends religious service: Patient refused    Active member of club or organization: Patient refused    Attends meetings of clubs or organizations: Patient refused  Relationship status: Patient refused  Other Topics Concern  . Not on file  Social History Narrative  . Not on file    Outpatient Encounter Medications as of 07/04/2018  Medication Sig  . ALPRAZolam (XANAX) 1 MG tablet Take 1 tablet (1 mg total) by mouth 3 (three) times daily as needed for anxiety.  . ARIPIPRAZOLE PO Take 15 mg by mouth daily.   . diclofenac (VOLTAREN) 75 MG EC tablet Take 75 mg by mouth 2 (two) times daily.   Marland Kitchen estradiol (ESTRACE) 1 MG tablet Take 1 tablet (1 mg total) by mouth daily. (Patient taking differently: Take 1 mg by mouth every other day. )  . HYDROcodone-acetaminophen (NORCO) 10-325 MG tablet Take 1 tablet by  mouth every 8 (eight) hours as needed for severe pain.  . hydrOXYzine (VISTARIL) 50 MG capsule Take 50 mg by mouth as needed (for itching).   . venlafaxine XR (EFFEXOR XR) 150 MG 24 hr capsule Take 1 capsule by mouth 2 (two) times daily.   Marland Kitchen Besifloxacin HCl (BESIVANCE) 0.6 % SUSP Besivance 0.6 % eye drops,suspension  . Difluprednate (DUREZOL) 0.05 % EMUL Durezol 0.05 % eye drops   No facility-administered encounter medications on file as of 07/04/2018.     Activities of Daily Living In your present state of health, do you have any difficulty performing the following activities: 07/04/2018  Hearing? N  Vision? N  Difficulty concentrating or making decisions? N  Walking or climbing stairs? Y  Comment Due to left leg pains.   Dressing or bathing? N  Doing errands, shopping? N  Preparing Food and eating ? N  Using the Toilet? N  In the past six months, have you accidently leaked urine? N  Do you have problems with loss of bowel control? N  Managing your Medications? N  Managing your Finances? N  Housekeeping or managing your Housekeeping? N  Some recent data might be hidden    Patient Care Team: Mar Daring, PA-C as PCP - General (Family Medicine) Bernita Raisin, MD as Consulting Physician Earnestine Leys, MD as Consulting Physician (Orthopedic Surgery)    Assessment:   This is a routine wellness examination for Patricia Decker.  Exercise Activities and Dietary recommendations Current Exercise Habits: The patient does not participate in regular exercise at present, Exercise limited by: None identified  Goals    . DIET - INCREASE LEAN PROTEINS     Recommend to continue current diet regimen of cutting out pork and eating more lean meats.     Marland Kitchen DIET - INCREASE WATER INTAKE     Recommend to drink at least 6-8 8oz glasses of water per day.       Fall Risk: Fall Risk  07/04/2018 06/22/2017 06/11/2015  Falls in the past year? 0 No No    FALL RISK PREVENTION PERTAINING TO THE  HOME:  Any stairs in or around the home? Yes  If so, are there any without handrails? Yes   Home free of loose throw rugs in walkways, pet beds, electrical cords, etc? Yes  Adequate lighting in your home to reduce risk of falls? Yes   ASSISTIVE DEVICES UTILIZED TO PREVENT FALLS:  Life alert? No  Use of a cane, walker or w/c? No  Grab bars in the bathroom? Yes  Shower chair or bench in shower? No  Elevated toilet seat or a handicapped toilet? No    TIMED UP AND GO:  Was the test performed? No .    Depression Screen [facility] 2/9  Scores 07/04/2018 06/22/2017 06/13/2016 06/11/2015  PHQ - 2 Score 0 2 2 1   PHQ- 9 Score - 5 6 -     Cognitive Function: Declined today.      6CIT Screen 06/22/2017  What Year? 0 points  What month? 0 points  What time? 0 points  Count back from 20 0 points  Months in reverse 0 points  Repeat phrase 0 points  Total Score 0     There is no immunization history on file for this patient.  Qualifies for Shingles Vaccine? Yes . Due for Shingrix. Education has been provided regarding the importance of this vaccine. Pt has been advised to call insurance company to determine out of pocket expense. Advised may also receive vaccine at local pharmacy or Health Dept. Verbalized acceptance and understanding.  Tdap: Although this vaccine is not a covered service during a Wellness Exam, does the patient still wish to receive this vaccine today?  No .  Advised may receive this vaccine at local pharmacy or Health Dept. Aware to provide a copy of the vaccination record if obtained from local pharmacy or Health Dept. Verbalized acceptance and understanding.   Screening Tests Health Maintenance  Topic Date Due  . TETANUS/TDAP  05/10/1976  . PAP SMEAR-Modifier  06/11/2018  . INFLUENZA VACCINE  10/06/2018  . MAMMOGRAM  04/04/2020  . COLONOSCOPY  07/01/2026  . Hepatitis C Screening  Completed  . HIV Screening  Completed    Cancer Screenings:  Colorectal Screening:  Completed 06/30/16. Repeat every 10 years.  Mammogram: Completed 04/04/18.   Lung Cancer Screening: (Low Dose CT Chest recommended if Age 7-80 years, 30 pack-year currently smoking OR have quit w/in 15years.) does not qualify.   Additional Screening:  Hepatitis C Screening: Up to date  Vision Screening: Recommended annual ophthalmology exams for early detection of glaucoma and other disorders of the eye.  Dental Screening: Recommended annual dental exams for proper oral hygiene  Community Resource Referral:  CRR required this visit?  No       Plan:  I have personally reviewed and addressed the Medicare Annual Wellness questionnaire and have noted the following in the patient's chart:  A. Medical and social history B. Use of alcohol, tobacco or illicit drugs  C. Current medications and supplements D. Functional ability and status E.  Nutritional status F.  Physical activity G. Advance directives H. List of other physicians I.  Hospitalizations, surgeries, and ER visits in previous 12 months J.  [place] such as hearing and vision if needed, cognitive and depression L. Referrals and appointments   In addition, I have reviewed and discussed with patient certain preventive protocols, quality metrics, and best practice recommendations. A written personalized care plan for preventive services as well as general preventive health recommendations were provided to patient. Nurse Health Advisor  Signed,    Traves Majchrzak [place], [place]  9/51/8841 Nurse Health Advisor   Nurse Notes: Pt declined the tetanus vaccine.

## 2018-07-10 ENCOUNTER — Other Ambulatory Visit: Payer: Self-pay | Admitting: Physician Assistant

## 2018-07-10 DIAGNOSIS — N951 Menopausal and female climacteric states: Secondary | ICD-10-CM

## 2018-07-31 ENCOUNTER — Other Ambulatory Visit: Payer: Self-pay | Admitting: *Deleted

## 2018-07-31 DIAGNOSIS — M1990 Unspecified osteoarthritis, unspecified site: Secondary | ICD-10-CM

## 2018-07-31 MED ORDER — HYDROCODONE-ACETAMINOPHEN 10-325 MG PO TABS: 1.0000 | ORAL_TABLET | Freq: Three times a day (TID) | ORAL | 0 refills | Status: DC | PRN

## 2018-07-31 NOTE — Telephone Encounter (Signed)
Please review

## 2018-08-15 ENCOUNTER — Ambulatory Visit: Payer: Medicare Other | Admitting: Psychiatry

## 2018-08-29 ENCOUNTER — Encounter: Payer: Medicare Other | Admitting: Physician Assistant

## 2018-08-29 DIAGNOSIS — M7072 Other bursitis of hip, left hip: Secondary | ICD-10-CM | POA: Diagnosis not present

## 2018-08-30 ENCOUNTER — Other Ambulatory Visit: Payer: Self-pay

## 2018-08-30 DIAGNOSIS — M1990 Unspecified osteoarthritis, unspecified site: Secondary | ICD-10-CM

## 2018-08-30 MED ORDER — HYDROCODONE-ACETAMINOPHEN 10-325 MG PO TABS: 1.0000 | ORAL_TABLET | Freq: Three times a day (TID) | ORAL | 0 refills | Status: DC | PRN

## 2018-08-30 NOTE — Telephone Encounter (Signed)
Patient request refill

## 2018-09-27 ENCOUNTER — Other Ambulatory Visit: Payer: Self-pay

## 2018-09-27 DIAGNOSIS — M1990 Unspecified osteoarthritis, unspecified site: Secondary | ICD-10-CM

## 2018-09-27 MED ORDER — HYDROCODONE-ACETAMINOPHEN 10-325 MG PO TABS: 1.0000 | ORAL_TABLET | Freq: Three times a day (TID) | ORAL | 0 refills | Status: DC | PRN

## 2018-10-03 ENCOUNTER — Encounter: Payer: Medicare Other | Admitting: Physician Assistant

## 2018-10-03 ENCOUNTER — Ambulatory Visit: Payer: Medicare Other

## 2018-10-24 DIAGNOSIS — F332 Major depressive disorder, recurrent severe without psychotic features: Secondary | ICD-10-CM | POA: Diagnosis not present

## 2018-10-24 DIAGNOSIS — Z79899 Other long term (current) drug therapy: Secondary | ICD-10-CM | POA: Diagnosis not present

## 2018-10-30 ENCOUNTER — Telehealth: Payer: Self-pay | Admitting: Physician Assistant

## 2018-10-30 DIAGNOSIS — M1990 Unspecified osteoarthritis, unspecified site: Secondary | ICD-10-CM

## 2018-10-30 NOTE — Telephone Encounter (Signed)
Pt needs a refill on her   Hydrocodone  10-325  Patricia Decker

## 2018-10-30 NOTE — Telephone Encounter (Signed)
Please review

## 2018-10-31 MED ORDER — HYDROCODONE-ACETAMINOPHEN 10-325 MG PO TABS: 1.0000 | ORAL_TABLET | Freq: Three times a day (TID) | ORAL | 0 refills | Status: DC | PRN

## 2018-10-31 NOTE — Telephone Encounter (Signed)
Will forward to PCP 

## 2018-11-01 ENCOUNTER — Telehealth: Payer: Self-pay

## 2018-11-01 NOTE — Telephone Encounter (Signed)
Pt has Medicare primary and Emergency planning/management officer.

## 2018-11-01 NOTE — Telephone Encounter (Signed)
lmtcb for information on what insurance she has for medications.

## 2018-11-20 NOTE — Progress Notes (Signed)
Patient: Patricia Decker Female    DOB: 08-15-1955   63 y.o.   MRN: [id number] Visit Date: 11/21/2018  Today's Provider: Mar Daring, PA-C   Chief Complaint  Patient presents with  . Follow-up   Subjective:    I,Joseline E. Rosas,RMA am acting as a Education administrator for [employer], PA-C.  HPI   Neuritis or radiculitis due to rupture of lumbar intervertebral disc From 06/22/2017-no changes. Patient in pain and in tears because she is hurting a lot more a this moment because it gets aggravated with walking and movements.  Mild intermittent asthma without complication From [id number] changes.   Anxiety From 06/22/2017-Stable. Followed by psychiatry.   Other iron deficiency anemia From 06/22/2017-Stable. Labs checked, no changes.  Patient reports she is having intense pain in the left hip. She does not even want to sit down because she has such a hard time getting back up. She is having pain in the left buttocks that radiates around to the lateral side of the hip and down to the left knee. She is followed by Dr. Sabra Heck and reports having multiple steroid shots in it for pain relief. She reports they work for less than a week. She follows up with him next month.   Allergies  Allergen Reactions  . Penicillins   . Sulfa Antibiotics     Indigestion     Current Outpatient Medications:  .  ALPRAZolam (XANAX) 1 MG tablet, Take 1 tablet (1 mg total) by mouth 3 (three) times daily as needed for anxiety., Disp: 90 tablet, Rfl: 5 .  ARIPIPRAZOLE PO, Take 15 mg by mouth daily. , Disp: , Rfl:  .  estradiol (ESTRACE) 1 MG tablet, Take 1 tablet (1 mg total) by mouth every other day., Disp: 45 tablet, Rfl: 1 .  HYDROcodone-acetaminophen (NORCO) 10-325 MG tablet, Take 1 tablet by mouth every 8 (eight) hours as needed for severe pain., Disp: 60 tablet, Rfl: 0 .  hydrOXYzine (VISTARIL) 50 MG capsule, Take 50 mg by mouth as needed (for itching). , Disp: , Rfl:  .  venlafaxine XR  (EFFEXOR XR) 150 MG 24 hr capsule, Take 1 capsule by mouth 2 (two) times daily. , Disp: , Rfl:  .  diclofenac (VOLTAREN) 75 MG EC tablet, Take 75 mg by mouth 2 (two) times daily. , Disp: , Rfl:   Review of Systems  Constitutional: Negative.   Respiratory: Negative.   Cardiovascular: Negative.   Gastrointestinal: Negative.   Musculoskeletal: Positive for arthralgias, back pain, gait problem and myalgias.  Neurological: Positive for weakness.    Social History   Tobacco Use  . Smoking status: Never Smoker  . Smokeless tobacco: Never Used  Substance Use Topics  . Alcohol use: No      Objective:   BP (!) 179/81 (BP Location: Left Arm, Patient Position: Sitting, Cuff Size: Large)   Pulse 64   Temp 97.8 F (36.6 C) (Other (Comment))   Resp 16   Wt 147 lb 6.4 oz (66.9 kg)   BMI 24.53 kg/m  Vitals:   11/21/18 1416  BP: (!) 179/81  Pulse: 64  Resp: 16  Temp: 97.8 F (36.6 C)  TempSrc: Other (Comment)  Weight: 147 lb 6.4 oz (66.9 kg)  Body mass index is 24.53 kg/m.   Physical Exam Vitals signs reviewed.  Constitutional:      General: She is not in acute distress.    Appearance: Normal appearance. She is well-developed. She is not  ill-appearing or diaphoretic.  Neck:     Musculoskeletal: Normal range of motion and neck supple.  Cardiovascular:     Rate and Rhythm: Normal rate and regular rhythm.     Pulses: Normal pulses.     Heart sounds: Normal heart sounds. No murmur. No friction rub. No gallop.   Pulmonary:     Effort: Pulmonary effort is normal. No respiratory distress.     Breath sounds: Normal breath sounds. No wheezing or rales.  Musculoskeletal:     Comments: Has brace over left hip  Neurological:     Mental Status: She is alert.      No results found for any visits on 11/21/18.     Assessment & Plan    1. Elevated blood pressure reading Elevated today but patient in a lot of pain with hip. Will monitor in subsequent visits. Will check labs as  below and f/u pending results. - CBC w/Diff/Platelet - Comprehensive Metabolic Panel (CMET) - Lipid Profile  2. Mild intermittent asthma without complication Stable on albuterol prn.   3. Anxiety Having some increased symptoms at this time due to pain. She is followed by psychiatry.   4. Other iron deficiency anemia Will check labs as below and f/u pending results. - CBC w/Diff/Platelet  5. Arthritis Stable. Diagnosis pulled for medication refill. Continue current medical treatment plan. [place] reviewed.  - HYDROcodone-acetaminophen (NORCO) 10-325 MG tablet; Take 1 tablet by mouth every 8 (eight) hours as needed for severe pain.  Dispense: 60 tablet; Refill: 0  6. Lipid screening Will check labs as below and f/u pending results. - Lipid Profile     Mar Daring, PA-C  [facility]

## 2018-11-21 ENCOUNTER — Ambulatory Visit (INDEPENDENT_AMBULATORY_CARE_PROVIDER_SITE_OTHER): Payer: Medicare Other | Admitting: Physician Assistant

## 2018-11-21 ENCOUNTER — Other Ambulatory Visit: Payer: Self-pay

## 2018-11-21 ENCOUNTER — Encounter: Payer: Self-pay | Admitting: Physician Assistant

## 2018-11-21 ENCOUNTER — Other Ambulatory Visit: Payer: Self-pay | Admitting: Physician Assistant

## 2018-11-21 VITALS — BP 179/81 | HR 64 | Temp 97.8°F | Resp 16 | Wt 147.4 lb

## 2018-11-21 DIAGNOSIS — F419 Anxiety disorder, unspecified: Secondary | ICD-10-CM | POA: Diagnosis not present

## 2018-11-21 DIAGNOSIS — M1990 Unspecified osteoarthritis, unspecified site: Secondary | ICD-10-CM

## 2018-11-21 DIAGNOSIS — Z13220 Encounter for screening for lipoid disorders: Secondary | ICD-10-CM | POA: Diagnosis not present

## 2018-11-21 DIAGNOSIS — D508 Other iron deficiency anemias: Secondary | ICD-10-CM

## 2018-11-21 DIAGNOSIS — J4520 Mild intermittent asthma, uncomplicated: Secondary | ICD-10-CM

## 2018-11-21 DIAGNOSIS — R030 Elevated blood-pressure reading, without diagnosis of hypertension: Secondary | ICD-10-CM | POA: Diagnosis not present

## 2018-11-21 MED ORDER — HYDROCODONE-ACETAMINOPHEN 10-325 MG PO TABS: 1.0000 | ORAL_TABLET | Freq: Three times a day (TID) | ORAL | 0 refills | Status: DC | PRN

## 2018-11-21 NOTE — Patient Instructions (Signed)

## 2018-11-22 ENCOUNTER — Telehealth: Payer: Self-pay | Admitting: *Deleted

## 2018-11-22 ENCOUNTER — Encounter: Payer: Self-pay | Admitting: Physician Assistant

## 2018-11-22 DIAGNOSIS — E7800 Pure hypercholesterolemia, unspecified: Secondary | ICD-10-CM | POA: Insufficient documentation

## 2018-11-22 LAB — COMPREHENSIVE METABOLIC PANEL
ALT: 27 IU/L (ref 0–32)
AST: 55 IU/L — ABNORMAL HIGH (ref 0–40)
Albumin/Globulin Ratio: 1.6 (ref 1.2–2.2)
Albumin: 4.6 g/dL (ref 3.8–4.8)
Alkaline Phosphatase: 71 IU/L (ref 39–117)
BUN/Creatinine Ratio: 14 (ref 12–28)
BUN: 12 mg/dL (ref 8–27)
Bilirubin Total: <0.2 mg/dL (ref 0.0–1.2)
CO2: 21 mmol/L (ref 20–29)
Calcium: 9.7 mg/dL (ref 8.7–10.3)
Chloride: 102 mmol/L (ref 96–106)
Creatinine, Ser: 0.84 mg/dL (ref 0.57–1.00)
GFR calc Af Amer: 87 mL/min/{1.73_m2} (ref >59)
GFR calc non Af Amer: 75 mL/min/{1.73_m2} (ref >59)
Globulin, Total: 2.8 g/dL (ref 1.5–4.5)
Glucose: 91 mg/dL (ref 65–99)
Potassium: 4.2 mmol/L (ref 3.5–5.2)
Sodium: 142 mmol/L (ref 134–144)
Total Protein: 7.4 g/dL (ref 6.0–8.5)

## 2018-11-22 LAB — LIPID PANEL
Chol/HDL Ratio: 2.9 ratio (ref 0.0–4.4)
Cholesterol, Total: 209 mg/dL — ABNORMAL HIGH (ref 100–199)
HDL: 71 mg/dL (ref >39)
LDL Chol Calc (NIH): 127 mg/dL — ABNORMAL HIGH (ref 0–99)
Triglycerides: 60 mg/dL (ref 0–149)
VLDL Cholesterol Cal: 11 mg/dL (ref 5–40)

## 2018-11-22 LAB — CBC WITH DIFFERENTIAL/PLATELET
Basophils Absolute: 0 10*3/uL (ref 0.0–0.2)
Basos: 1 %
EOS (ABSOLUTE): 0.1 10*3/uL (ref 0.0–0.4)
Eos: 2 %
Hematocrit: 35.6 % (ref 34.0–46.6)
Hemoglobin: 11.6 g/dL (ref 11.1–15.9)
Immature Grans (Abs): 0 10*3/uL (ref 0.0–0.1)
Immature Granulocytes: 0 %
Lymphocytes Absolute: 3.8 10*3/uL — ABNORMAL HIGH (ref 0.7–3.1)
Lymphs: 57 %
MCH: 27.8 pg (ref 26.6–33.0)
MCHC: 32.6 g/dL (ref 31.5–35.7)
MCV: 85 fL (ref 79–97)
Monocytes Absolute: 0.5 10*3/uL (ref 0.1–0.9)
Monocytes: 7 %
Neutrophils Absolute: 2.2 10*3/uL (ref 1.4–7.0)
Neutrophils: 33 %
Platelets: 351 10*3/uL (ref 150–450)
RBC: 4.17 x10E6/uL (ref 3.77–5.28)
RDW: 15.3 % (ref 11.7–15.4)
WBC: 6.6 10*3/uL (ref 3.4–10.8)

## 2018-11-22 NOTE — Telephone Encounter (Signed)
-----   Message from Mar Daring, PA-C sent at 11/22/2018  9:28 AM EDT ----- Cholesterol is borderline high. Will monitor.

## 2018-11-22 NOTE — Telephone Encounter (Signed)
LMOVM for pt to return call 

## 2018-11-22 NOTE — Telephone Encounter (Signed)
-----   Message from Mar Daring, [place] sent at 11/22/2018  9:32 AM EDT ----- Blood count is normal. Kidney function is normal. One of the liver enzymes is borderline elevated. This unfortunately is most likely secondary to increased tylenol/acetaminophen use due to your pain. If you can cut back some that would be best, but we need to monitor this lab to make sure it does not continue to increase. I would recommend [place] recheck in 2-3 months.

## 2018-11-23 NOTE — Telephone Encounter (Signed)
Pt returned missed call.  Please call pt back. ° °Thanks, °TGH °

## 2018-11-26 NOTE — Telephone Encounter (Signed)
LMTCB

## 2018-11-27 NOTE — Telephone Encounter (Signed)
LMTCB

## 2018-11-27 NOTE — Telephone Encounter (Signed)
Patient advised as directed below. Thanks. 

## 2018-12-05 DIAGNOSIS — H5711 Ocular pain, right eye: Secondary | ICD-10-CM | POA: Diagnosis not present

## 2018-12-14 ENCOUNTER — Other Ambulatory Visit: Payer: Self-pay | Admitting: Physician Assistant

## 2018-12-14 DIAGNOSIS — F411 Generalized anxiety disorder: Secondary | ICD-10-CM

## 2018-12-24 DIAGNOSIS — Z79899 Other long term (current) drug therapy: Secondary | ICD-10-CM | POA: Diagnosis not present

## 2018-12-24 DIAGNOSIS — F332 Major depressive disorder, recurrent severe without psychotic features: Secondary | ICD-10-CM | POA: Diagnosis not present

## 2018-12-31 ENCOUNTER — Telehealth: Payer: Self-pay | Admitting: Physician Assistant

## 2018-12-31 DIAGNOSIS — M1990 Unspecified osteoarthritis, unspecified site: Secondary | ICD-10-CM

## 2018-12-31 MED ORDER — HYDROCODONE-ACETAMINOPHEN 10-325 MG PO TABS: 1.0000 | ORAL_TABLET | Freq: Three times a day (TID) | ORAL | 0 refills | Status: DC | PRN

## 2018-12-31 NOTE — Telephone Encounter (Signed)
Refilled for her 

## 2018-12-31 NOTE — Telephone Encounter (Signed)
Pt reqeusting medication refill of HYDROcodone-acetaminophen (NORCO) 10-325 MG tablet sent to [facility].

## 2019-01-14 ENCOUNTER — Other Ambulatory Visit: Payer: Self-pay

## 2019-01-14 ENCOUNTER — Ambulatory Visit (INDEPENDENT_AMBULATORY_CARE_PROVIDER_SITE_OTHER): Payer: Medicare Other | Admitting: Family Medicine

## 2019-01-14 ENCOUNTER — Encounter: Payer: Self-pay | Admitting: Family Medicine

## 2019-01-14 DIAGNOSIS — J3489 Other specified disorders of nose and nasal sinuses: Secondary | ICD-10-CM

## 2019-01-14 MED ORDER — FLUTICASONE PROPIONATE 50 MCG/ACT NA SUSP: 2.0000 | Freq: Every day | NASAL | 6 refills | Status: DC

## 2019-01-14 NOTE — Progress Notes (Signed)
Patient: Patricia Decker Female    DOB: 1955/10/25   63 y.o.   MRN: [id number] Visit Date: 01/14/2019  Today's Provider: Lavon Paganini, MD   Chief Complaint  Patient presents with  . Sinusitis   Subjective:     Sinusitis This is a new problem. The current episode started 1 to 4 weeks ago. The problem has been gradually improving since onset. There has been no fever. Associated symptoms include congestion and sinus pressure. Pertinent negatives include no ear pain, headaches, hoarse voice, sneezing or sore throat. The treatment provided moderate relief.    Symptoms present for >10 days Reports yellow nasal drainage and foul smell.  Tried mucinex D and it has helped significantly.  Sinus pressure has resolved.   Virtual Visit via Telephone Note  I connected with Patricia Decker on 01/14/19 at 11:20 AM EST by telephone and verified that I am speaking with the correct person using two identifiers.   Patient location: home Provider location: [place] involved in the visit: patient, provider   I discussed the limitations, risks, security and privacy concerns of performing an evaluation and management service by telephone and the availability of in person appointments. I also discussed with the patient that there may be a patient responsible charge related to this service. The patient expressed understanding and agreed to proceed.   Allergies  Allergen Reactions  . Penicillins   . Sulfa Antibiotics     Indigestion     Current Outpatient Medications:  .  ALPRAZolam (XANAX) 1 MG tablet, TAKE 1 TABLET BY MOUTH 3 TIMES DAILY AS NEEDED FOR ANXIETY, Disp: 90 tablet, Rfl: 5 .  ARIPIPRAZOLE PO, Take 15 mg by mouth daily. , Disp: , Rfl:  .  diclofenac (VOLTAREN) 75 MG EC tablet, Take 75 mg by mouth 2 (two) times daily. , Disp: , Rfl:  .  estradiol (ESTRACE) 1 MG tablet, Take 1 tablet (1 mg total) by mouth every other day., Disp: 45 tablet, Rfl: 1 .   HYDROcodone-acetaminophen (NORCO) 10-325 MG tablet, Take 1 tablet by mouth every 8 (eight) hours as needed for severe pain., Disp: 60 tablet, Rfl: 0 .  hydrOXYzine (VISTARIL) 50 MG capsule, Take 50 mg by mouth as needed (for itching). , Disp: , Rfl:  .  venlafaxine XR (EFFEXOR XR) 150 MG 24 hr capsule, Take 1 capsule by mouth 2 (two) times daily. , Disp: , Rfl:   Review of Systems  Constitutional: Negative.   HENT: Positive for congestion, rhinorrhea, sinus pressure and sinus pain. Negative for ear discharge, ear pain, hoarse voice, postnasal drip, sneezing, sore throat, tinnitus and trouble swallowing.   Eyes: Negative.   Respiratory: Negative.   Gastrointestinal: Negative.   Neurological: Negative for dizziness, light-headedness and headaches.    Social History   Tobacco Use  . Smoking status: Never Smoker  . Smokeless tobacco: Never Used  Substance Use Topics  . Alcohol use: No      Objective:   There were no vitals taken for this visit. There were no vitals filed for this visit.There is no height or weight on file to calculate BMI.   Physical Exam Speaking in full sentences in NAD  No results found for any visits on 01/14/19.     Assessment & Plan    Follow Up Instructions:    I discussed the assessment and treatment plan with the patient. The patient was provided an opportunity to ask questions and all were answered.  The patient agreed with the plan and demonstrated an understanding of the instructions.   The patient was advised to call back or seek an in-person evaluation if the symptoms worsen or if the condition fails to improve as anticipated.   1. Sinus pressure - symptoms c/w sinusitis that is resolving  - no symptoms of AOM, CAP, strep pharyngitis, or other infection - given that symptoms are resolving, no need for antibiotic at this time -Given that symptoms have been present for more than 10 days, no indication for Covid testing at this time - discussed  symptomatic management (flonase, decongestants, etc), natural course, and return precautions    Meds ordered this encounter  Medications  . fluticasone (FLONASE) 50 MCG/ACT nasal spray    Sig: Place 2 sprays into both nostrils daily.    Dispense:  16 g    Refill:  6     Return if symptoms worsen or fail to improve.   The entirety of the information documented in the History of Present Illness, Review of Systems and Physical Exam were personally obtained by me. Portions of this information were initially documented by Ashley Royalty, CMA and reviewed by me for thoroughness and accuracy.    Raney Antwine, Dionne Bucy, MD MPH [facility]

## 2019-01-15 ENCOUNTER — Telehealth: Payer: Self-pay | Admitting: Physician Assistant

## 2019-01-15 ENCOUNTER — Telehealth: Payer: Self-pay | Admitting: *Deleted

## 2019-01-15 MED ORDER — DOXYCYCLINE HYCLATE 100 MG PO TABS: 100.0000 mg | ORAL_TABLET | Freq: Two times a day (BID) | ORAL | 0 refills | Status: DC

## 2019-01-15 NOTE — Telephone Encounter (Signed)
OK eRx Doxycycline 100mg  BID x7d #14 r0. Please let patient know as well.

## 2019-01-15 NOTE — Telephone Encounter (Signed)
RX sent to [facility].  Left message advising pt.   Thanks,   -Mickel Baas

## 2019-01-15 NOTE — Telephone Encounter (Signed)
Pt wanting to go with the Rx - antibiotic to make sure to get the infection.  She's having yellow mucus in her sinus's that is coming out this morning.  Please call Rx into:  [place], [place]. [phone] (Phone) [phone] (Fax)   Thanks, [employer]

## 2019-01-15 NOTE — Telephone Encounter (Signed)
Patient was advised.  

## 2019-01-25 ENCOUNTER — Other Ambulatory Visit: Payer: Self-pay | Admitting: Physician Assistant

## 2019-01-25 DIAGNOSIS — M1990 Unspecified osteoarthritis, unspecified site: Secondary | ICD-10-CM

## 2019-01-25 MED ORDER — HYDROCODONE-ACETAMINOPHEN 10-325 MG PO TABS: 1.0000 | ORAL_TABLET | Freq: Three times a day (TID) | ORAL | 0 refills | Status: DC | PRN

## 2019-01-25 NOTE — Telephone Encounter (Signed)
Medication Refill - Medication: HYDROcodone-acetaminophen (NORCO) 10-325 MG tablet  Has the patient contacted their pharmacy? No. (Agent: If no, request that the patient contact the pharmacy for the refill.) (Agent: If yes, when and what did the pharmacy advise?)Controlled  Preferred Pharmacy (with phone number or street name): [place], [place]. [phone] (Phone) [phone] (Fax)    Agent: Please be advised that RX refills may take up to 3 business days. We ask that you follow-up with your pharmacy.

## 2019-02-13 DIAGNOSIS — L308 Other specified dermatitis: Secondary | ICD-10-CM | POA: Diagnosis not present

## 2019-02-25 ENCOUNTER — Other Ambulatory Visit: Payer: Self-pay | Admitting: Physician Assistant

## 2019-02-25 DIAGNOSIS — Z1231 Encounter for screening mammogram for malignant neoplasm of breast: Secondary | ICD-10-CM

## 2019-03-05 ENCOUNTER — Other Ambulatory Visit: Payer: Self-pay | Admitting: Physician Assistant

## 2019-03-05 DIAGNOSIS — M1990 Unspecified osteoarthritis, unspecified site: Secondary | ICD-10-CM

## 2019-03-05 MED ORDER — HYDROCODONE-ACETAMINOPHEN 10-325 MG PO TABS: 1.0000 | ORAL_TABLET | Freq: Three times a day (TID) | ORAL | 0 refills | Status: DC | PRN

## 2019-03-05 NOTE — Telephone Encounter (Signed)
Requested medication (s) are due for refill today: yes  Requested medication (s) are on the active medication list:yes  Last refill: 01/25/2019  Future visit scheduled: yes  Notes to clinic:  not delegated    Requested Prescriptions  Pending Prescriptions Disp Refills   HYDROcodone-acetaminophen (NORCO) 10-325 MG tablet 60 tablet 0    Sig: Take 1 tablet by mouth every 8 (eight) hours as needed for severe pain.      Not Delegated - Analgesics:  Opioid Agonist Combinations Failed - 03/05/2019  3:00 PM      Failed - This refill cannot be delegated      Failed - Urine Drug Screen completed in last 360 days.      Passed - Valid encounter within last 6 months    Recent Outpatient Visits           1 month ago Sinus pressure   [facility] [place], Dionne Bucy, MD   3 months ago Elevated blood pressure reading   [place], [place]   1 year ago Chronic narcotic use   [facility], [place], [place]   1 year ago Neuritis or radiculitis due to rupture of lumbar intervertebral disc   [facility] [place], Clearnce Sorrel, [place]   2 years ago Medicare annual wellness visit, subsequent   [place], Clearnce Sorrel, [place]       Future Appointments             In 4 months [facility], [place]

## 2019-03-05 NOTE — Telephone Encounter (Signed)
Copied from [place] [id number]. Topic: Quick Communication - Rx Refill/Question >> Mar 05, 2019  2:46 PM Rainey Pines A wrote: Medication: HYDROcodone-acetaminophen (NORCO) 10-325 MG tablet   Has the patient contacted their pharmacy? Yes (Agent: If no, request that the patient contact the pharmacy for the refill.) (Agent: If yes, when and what did the pharmacy advise?)Contact PCP  Preferred Pharmacy (with phone number or street name): [place], [place].  Phone:  [phone] Fax:  [phone]     Agent: Please be advised that RX refills may take up to 3 business days. We ask that you follow-up with your pharmacy.

## 2019-03-13 ENCOUNTER — Encounter: Payer: Medicare Other | Admitting: Physician Assistant

## 2019-03-18 NOTE — Progress Notes (Addendum)
Patient: Patricia Decker, Female    DOB: Jul 17, 1955, 64 y.o.   MRN: [id number] Visit Date: 03/21/2019  Today's Provider: Mar Daring, PA-C    Subjective:      Patricia Decker is a 64 y.o. female. She feels fairly well. She reports exercising walking. She reports she is sleeping fairly well. ----------------------------------------------------------- Mammogram Scheduled 04/10/2019  9:40 AM Colonoscopy:06/30/16-Repeat colonoscopy in 10 years   Review of Systems  Constitutional: Negative.   HENT: Negative.   Eyes: Negative.   Respiratory: Negative.   Cardiovascular: Negative.   Gastrointestinal: Negative.   Endocrine: Negative.   Genitourinary: Negative.   Musculoskeletal: Negative.   Skin: Negative.   Allergic/Immunologic: Negative.   Neurological: Negative.   Hematological: Negative.   Psychiatric/Behavioral: Negative.     Social History   Socioeconomic History  . Marital status: Widowed    Spouse name: Not on file  . Number of children: 1  . Years of education: Not on file  . Highest education level: Some college, no degree  Occupational History  . Occupation: disability  Tobacco Use  . Smoking status: Never Smoker  . Smokeless tobacco: Never Used  Substance and Sexual Activity  . Alcohol use: No  . Drug use: No  . Sexual activity: Not on file  Other Topics Concern  . Not on file  Social History Narrative  . Not on file   Social Determinants of Health   Financial Resource Strain:   . Difficulty of Paying Living Expenses: Not on file  Food Insecurity:   . Worried About Charity fundraiser in the Last Year: Not on file  . Ran Out of Food in the Last Year: Not on file  Transportation Needs:   . Lack of Transportation (Medical): Not on file  . Lack of Transportation (Non-Medical): Not on file  Physical Activity: Inactive  . Days of Exercise per Week: 0 days  . Minutes of Exercise per Session: 0 min  Stress: Stress Concern Present  .  Feeling of Stress : To some extent  Social Connections: Unknown  . Frequency of Communication with Friends and Family: Patient refused  . Frequency of Social Gatherings with Friends and Family: Patient refused  . Attends Religious Services: Patient refused  . Active Member of Clubs or Organizations: Patient refused  . Attends Archivist Meetings: Patient refused  . Marital Status: Patient refused  Intimate Partner Violence: Unknown  . Fear of Current or Ex-Partner: Patient refused  . Emotionally Abused: Patient refused  . Physically Abused: Patient refused  . Sexually Abused: Patient refused    Past Medical History:  Diagnosis Date  . Anemia   . Anxiety   . Arthritis   . Asthma   . Cataract    removed  . Depression      Patient Active Problem List   Diagnosis Date Noted  . Hypercholesterolemia 11/22/2018  . Elevated blood pressure reading 11/21/2018  . Arthritis 11/13/2014  . Airway hyperreactivity 11/13/2014  . Atypical nevus 11/13/2014  . Folliculitis [id number]  . Blood in the urine 11/13/2014  . Cramp in muscle 11/13/2014  . Bee sting reaction 11/13/2014  . Neuritis or radiculitis due to rupture of lumbar intervertebral disc 10/17/2013  . Abnormal loss of weight 11/05/2008  . Absolute anemia 11/05/2008  . Menopausal symptom 11/05/2008  . Migraine aura without headache 11/05/2008  . Anxiety 08/26/2008  . Clinical depression 08/26/2008    Past Surgical History:  Procedure Laterality Date  .  ABDOMINAL HYSTERECTOMY    . APPENDECTOMY    . CATARACT EXTRACTION, BILATERAL    . CHOLECYSTECTOMY    . COLONOSCOPY WITH PROPOFOL N/A 06/30/2016   Procedure: COLONOSCOPY WITH PROPOFOL;  Surgeon: Jonathon Bellows, MD;  Location: [facility] ENDOSCOPY;  Service: Endoscopy;  Laterality: N/A;    Her family history includes Alzheimer's disease in her sister; Breast cancer (age of onset: 70) in her sister.   Current Outpatient Medications:  .  ALPRAZolam (XANAX) 1 MG tablet, TAKE  1 TABLET BY MOUTH 3 TIMES DAILY AS NEEDED FOR ANXIETY, Disp: 90 tablet, Rfl: 5 .  ARIPIPRAZOLE PO, Take 15 mg by mouth daily. , Disp: , Rfl:  .  augmented betamethasone dipropionate (DIPROLENE-AF) 0.05 % cream, , Disp: , Rfl:  .  diclofenac (VOLTAREN) 75 MG EC tablet, Take 75 mg by mouth 2 (two) times daily. , Disp: , Rfl:  .  estradiol (ESTRACE) 1 MG tablet, Take 1 tablet (1 mg total) by mouth every other day., Disp: 45 tablet, Rfl: 1 .  HYDROcodone-acetaminophen (NORCO) 10-325 MG tablet, Take 1 tablet by mouth every 8 (eight) hours as needed for severe pain., Disp: 60 tablet, Rfl: 0 .  hydrOXYzine (VISTARIL) 50 MG capsule, Take 50 mg by mouth as needed (for itching). , Disp: , Rfl:  .  venlafaxine XR (EFFEXOR XR) 150 MG 24 hr capsule, Take 1 capsule by mouth 2 (two) times daily. , Disp: , Rfl:   Patient Care Team: Mar Daring, PA-C as PCP - General (Family Medicine) Bernita Raisin, MD as Consulting Physician Earnestine Leys, MD as Consulting Physician (Orthopedic Surgery)    Objective:    Vitals: BP 130/75   Pulse 83   Temp (!) 97.3 F (36.3 C) (Temporal)   Resp 18   Ht 5\' 5"  (1.651 m)   Wt 148 lb (67.1 kg)   BMI 24.63 kg/m   Physical Exam Vitals reviewed.  Constitutional:      General: She is not in acute distress.    Appearance: Normal appearance. She is well-developed and normal weight. She is not ill-appearing or diaphoretic.  HENT:     Head: Normocephalic and atraumatic.     Right Ear: Tympanic membrane, ear canal and external ear normal.     Left Ear: Tympanic membrane, ear canal and external ear normal.  Eyes:     General: No scleral icterus.       Right eye: No discharge.        Left eye: No discharge.     Extraocular Movements: Extraocular movements intact.     Conjunctiva/sclera: Conjunctivae normal.     Pupils: Pupils are equal, round, and reactive to light.  Neck:     Thyroid: No thyromegaly.     Vascular: No carotid bruit or JVD.     Trachea: No  tracheal deviation.  Cardiovascular:     Rate and Rhythm: Normal rate and regular rhythm.     Pulses: Normal pulses.     Heart sounds: Normal heart sounds. No murmur. No friction rub. No gallop.   Pulmonary:     Effort: Pulmonary effort is normal. No respiratory distress.     Breath sounds: Normal breath sounds. No wheezing or rales.  Chest:     Chest wall: No tenderness.  Abdominal:     General: Abdomen is flat. Bowel sounds are normal. There is no distension.     Palpations: Abdomen is soft. There is no mass.     Tenderness: There is no abdominal tenderness.  There is no guarding or rebound.  Musculoskeletal:        General: No tenderness. Normal range of motion.     Cervical back: Normal range of motion and neck supple.     Right lower leg: No edema.     Left lower leg: No edema.  Lymphadenopathy:     Cervical: No cervical adenopathy.  Skin:    General: Skin is warm and dry.     Capillary Refill: Capillary refill takes less than 2 seconds.     Findings: No rash.  Neurological:     General: No focal deficit present.     Mental Status: She is alert and oriented to person, place, and time. Mental status is at baseline.  Psychiatric:        Mood and Affect: Mood normal.        Behavior: Behavior normal.        Thought Content: Thought content normal.        Judgment: Judgment normal.     Activities of Daily Living In your present state of health, do you have any difficulty performing the following activities: 03/21/2019 07/04/2018  Hearing? N N  Vision? N N  Difficulty concentrating or making decisions? N N  Walking or climbing stairs? N Y  Comment - Due to left leg pains.   Dressing or bathing? N N  Doing errands, shopping? N N  Preparing Food and eating ? - N  Using the Toilet? - N  In the past six months, have you accidently leaked urine? - N  Do you have problems with loss of bowel control? - N  Managing your Medications? - N  Managing your Finances? - N    Housekeeping or managing your Housekeeping? - N  Some recent data might be hidden    Fall Risk Assessment Fall Risk  03/21/2019 07/04/2018 06/22/2017 06/11/2015  Falls in the past year? 0 0 No No  Number falls in past yr: 0 - - -  Injury with Fall? 0 - - -  Follow up Falls evaluation completed - - -     Depression Screen PHQ 2/9 Scores 03/21/2019 07/04/2018 06/22/2017 06/13/2016  PHQ - 2 Score 2 0 2 2  PHQ- 9 Score 3 - 5 6    6CIT Screen 06/22/2017  What Year? 0 points  What month? 0 points  What time? 0 points  Count back from 20 0 points  Months in reverse 0 points  Repeat phrase 0 points  Total Score 0      Assessment & Plan:    Reviewed patient's Family Medical History Reviewed and updated list of patient's medical providers Assessment of cognitive impairment was done Assessed patient's functional ability Established a written schedule for health screening [place] Completed and Reviewed  Exercise Activities and Dietary recommendations Goals    . DIET - INCREASE LEAN PROTEINS     Recommend to continue current diet regimen of cutting out pork and eating more lean meats.     Marland Kitchen DIET - INCREASE WATER INTAKE     Recommend to drink at least 6-8 8oz glasses of water per day.        There is no immunization history on file for this patient.  Health Maintenance  Topic Date Due  . INFLUENZA VACCINE  06/05/2019 (Originally 10/06/2018)  . TETANUS/TDAP  11/21/2019 (Originally 05/10/1976)  . MAMMOGRAM  04/04/2020  . PAP SMEAR-Modifier  06/10/2020  . COLONOSCOPY  07/01/2026  . Hepatitis C  Screening  Completed  . HIV Screening  Completed     Discussed health benefits of physical activity, and encouraged her to engage in regular exercise appropriate for her age and condition.    Normal exam. Up to date on screenings and vaccinations (patient declines flu vaccine).   2. Encounter for breast cancer screening using non-mammogram modality Breast exam today  was normal. There is no family history of breast cancer. She does perform regular self breast exams. Mammogram was ordered as below. Information for [facility] clinic was given to patient so she may schedule her mammogram at her convenience.  3. Chronic narcotic use Due for annual screen for chronic narcotic use. Does not use everyday, just with severe pain. Previous PDMP has never shown any red flags.  - Pain Mgt Scrn (14 Drugs), Ur  4. Chronic pain syndrome See above medical treatment plan. - Pain Mgt Scrn (14 Drugs), Ur  5. Encounter for chronic pain management See above medical treatment plan. - Pain Mgt Scrn (14 Drugs), Ur  6. Hypercholesterolemia Diet controlled. Will check labs as below and f/u pending results. - CBC w/Diff/Platelet - Comprehensive Metabolic Panel (CMET) - Lipid Profile  7. Elevated blood pressure reading Stable today. Will check labs as below and f/u pending results. - CBC w/Diff/Platelet - Comprehensive Metabolic Panel (CMET) - Lipid Profile  8. Neuritis or radiculitis due to rupture of lumbar intervertebral disc Chronic pain management.  ------------------------------------------------------------------------------------------------------------    Mar Daring, PA-C  [facility]

## 2019-03-21 ENCOUNTER — Other Ambulatory Visit: Payer: Self-pay

## 2019-03-21 ENCOUNTER — Ambulatory Visit (INDEPENDENT_AMBULATORY_CARE_PROVIDER_SITE_OTHER): Payer: Medicare Other | Admitting: Physician Assistant

## 2019-03-21 ENCOUNTER — Encounter: Payer: Self-pay | Admitting: Physician Assistant

## 2019-03-21 VITALS — BP 130/75 | HR 83 | Temp 97.3°F | Resp 18 | Ht 65.0 in | Wt 148.0 lb

## 2019-03-21 DIAGNOSIS — G894 Chronic pain syndrome: Secondary | ICD-10-CM

## 2019-03-21 DIAGNOSIS — Z1239 Encounter for other screening for malignant neoplasm of breast: Secondary | ICD-10-CM

## 2019-03-21 DIAGNOSIS — R030 Elevated blood-pressure reading, without diagnosis of hypertension: Secondary | ICD-10-CM

## 2019-03-21 DIAGNOSIS — M5116 Intervertebral disc disorders with radiculopathy, lumbar region: Secondary | ICD-10-CM

## 2019-03-21 DIAGNOSIS — G8929 Other chronic pain: Secondary | ICD-10-CM | POA: Diagnosis not present

## 2019-03-21 DIAGNOSIS — F1190 Opioid use, unspecified, uncomplicated: Secondary | ICD-10-CM | POA: Diagnosis not present

## 2019-03-21 DIAGNOSIS — E7800 Pure hypercholesterolemia, unspecified: Secondary | ICD-10-CM

## 2019-03-21 NOTE — Patient Instructions (Signed)

## 2019-03-22 ENCOUNTER — Telehealth: Payer: Self-pay

## 2019-03-22 DIAGNOSIS — E611 Iron deficiency: Secondary | ICD-10-CM

## 2019-03-22 LAB — PAIN MGT SCRN (14 DRUGS), UR
Amphetamine Scrn, Ur: NEGATIVE ng/mL
BARBITURATE SCREEN URINE: NEGATIVE ng/mL
BENZODIAZEPINE SCREEN, URINE: NEGATIVE ng/mL
Buprenorphine, Urine: NEGATIVE ng/mL
CANNABINOIDS UR QL SCN: NEGATIVE ng/mL
Cocaine (Metab) Scrn, Ur: NEGATIVE ng/mL
Creatinine(Crt), U: 50.8 mg/dL (ref 20.0–300.0)
Fentanyl, Urine: NEGATIVE pg/mL
Meperidine Screen, Urine: NEGATIVE ng/mL
Methadone Screen, Urine: NEGATIVE ng/mL
OXYCODONE+OXYMORPHONE UR QL SCN: NEGATIVE ng/mL
Opiate Scrn, Ur: NEGATIVE ng/mL
Ph of Urine: 5.9 (ref 4.5–8.9)
Phencyclidine Qn, Ur: NEGATIVE ng/mL
Propoxyphene Scrn, Ur: NEGATIVE ng/mL
Tramadol Screen, Urine: NEGATIVE ng/mL

## 2019-03-22 LAB — COMPREHENSIVE METABOLIC PANEL
ALT: 13 IU/L (ref 0–32)
AST: 20 IU/L (ref 0–40)
Albumin/Globulin Ratio: 1.6 (ref 1.2–2.2)
Albumin: 4.1 g/dL (ref 3.8–4.8)
Alkaline Phosphatase: 75 IU/L (ref 39–117)
BUN/Creatinine Ratio: 17 (ref 12–28)
BUN: 15 mg/dL (ref 8–27)
Bilirubin Total: <0.2 mg/dL (ref 0.0–1.2)
CO2: 21 mmol/L (ref 20–29)
Calcium: 9.4 mg/dL (ref 8.7–10.3)
Chloride: 105 mmol/L (ref 96–106)
Creatinine, Ser: 0.86 mg/dL (ref 0.57–1.00)
GFR calc Af Amer: 84 mL/min/{1.73_m2} (ref >59)
GFR calc non Af Amer: 73 mL/min/{1.73_m2} (ref >59)
Globulin, Total: 2.5 g/dL (ref 1.5–4.5)
Glucose: 96 mg/dL (ref 65–99)
Potassium: 4.5 mmol/L (ref 3.5–5.2)
Sodium: 139 mmol/L (ref 134–144)
Total Protein: 6.6 g/dL (ref 6.0–8.5)

## 2019-03-22 LAB — CBC WITH DIFFERENTIAL/PLATELET
Basophils Absolute: 0.1 10*3/uL (ref 0.0–0.2)
Basos: 1 %
EOS (ABSOLUTE): 0.1 10*3/uL (ref 0.0–0.4)
Eos: 1 %
Hematocrit: 34 % (ref 34.0–46.6)
Hemoglobin: 10.8 g/dL — ABNORMAL LOW (ref 11.1–15.9)
Immature Grans (Abs): 0 10*3/uL (ref 0.0–0.1)
Immature Granulocytes: 0 %
Lymphocytes Absolute: 2.3 10*3/uL (ref 0.7–3.1)
Lymphs: 37 %
MCH: 26.5 pg — ABNORMAL LOW (ref 26.6–33.0)
MCHC: 31.8 g/dL (ref 31.5–35.7)
MCV: 84 fL (ref 79–97)
Monocytes Absolute: 0.5 10*3/uL (ref 0.1–0.9)
Monocytes: 8 %
Neutrophils Absolute: 3.1 10*3/uL (ref 1.4–7.0)
Neutrophils: 53 %
Platelets: 335 10*3/uL (ref 150–450)
RBC: 4.07 x10E6/uL (ref 3.77–5.28)
RDW: 14.5 % (ref 11.7–15.4)
WBC: 6 10*3/uL (ref 3.4–10.8)

## 2019-03-22 LAB — LIPID PANEL
Chol/HDL Ratio: 3.1 ratio (ref 0.0–4.4)
Cholesterol, Total: 193 mg/dL (ref 100–199)
HDL: 62 mg/dL (ref >39)
LDL Chol Calc (NIH): 118 mg/dL — ABNORMAL HIGH (ref 0–99)
Triglycerides: 72 mg/dL (ref 0–149)
VLDL Cholesterol Cal: 13 mg/dL (ref 5–40)

## 2019-03-22 NOTE — Telephone Encounter (Signed)
Called and spoke with the patient about her labs. She will start taking the Iron Supplement and some in in 3-4 weeks to recheck her Iron levels. The lab req is up front for her to pick up when she come for the lab draw. She gave verbal understanding about her labs.

## 2019-03-25 ENCOUNTER — Telehealth: Payer: Self-pay

## 2019-03-25 NOTE — Telephone Encounter (Signed)
LMTCB 03/25/2019.  PEC may advised pt of lab work.    Thanks,   -Mickel Baas

## 2019-03-25 NOTE — Addendum Note (Signed)
Addended by: Mar Daring on: 03/25/2019 09:20 AM   Modules accepted: Level of Service

## 2019-03-25 NOTE — Telephone Encounter (Signed)
-----   Message from Mar Daring, [place] sent at 03/22/2019  5:14 PM EST ----- Patricia Decker your urine drug screen was normal for how you use the pain medication since you do not use it daily. However, it was negative for your alprazolam (benzodiazapine). I have you take this 3 times per day and see that it is filled monthly as if you were taking it. I am concerned that you are not taking this medication as prescribed and will have to stop refilling this medication at this time due to that. I apologize for the inconvenience.

## 2019-03-25 NOTE — Telephone Encounter (Signed)
Patricia Daring, PA-C  03/22/2019 7:12 AM EST    Hemoglobin has dropped some over the last 4 months. Most often this is iron deficiency. I would recommend to take a ferrous sulfate 325mg  at least once daily. We can recheck hemoglobin in 3-4 weeks. Rest of blood count is normal. Kidney and liver function are normal. Sugar is normal. Sodium, potassium and calcium are normal. Cholesterol is doing good and is lower compared to last year. Keep up the good work with healthy dieting.

## 2019-03-27 DIAGNOSIS — H43813 Vitreous degeneration, bilateral: Secondary | ICD-10-CM | POA: Diagnosis not present

## 2019-03-27 DIAGNOSIS — H04223 Epiphora due to insufficient drainage, bilateral lacrimal glands: Secondary | ICD-10-CM | POA: Diagnosis not present

## 2019-03-27 DIAGNOSIS — H35011 Changes in retinal vascular appearance, right eye: Secondary | ICD-10-CM | POA: Diagnosis not present

## 2019-03-27 NOTE — Telephone Encounter (Signed)
Pt. Given results and instructions. Verbalizes understanding. Will come in at 4 weeks for labs.

## 2019-04-02 ENCOUNTER — Other Ambulatory Visit: Payer: Self-pay | Admitting: Physician Assistant

## 2019-04-02 DIAGNOSIS — M1990 Unspecified osteoarthritis, unspecified site: Secondary | ICD-10-CM

## 2019-04-02 NOTE — Telephone Encounter (Signed)
Medication Refill - Medication: HYDROcodone-acetaminophen (NORCO) 10-325 MG tablet    Has the patient contacted their pharmacy? No. (Agent: If no, request that the patient contact the pharmacy for the refill.) (Agent: If yes, when and what did the pharmacy advise?)  Preferred Pharmacy (with phone number or street name):  [facility] [place], [place]. Phone:  [phone]  Fax:  [phone]       Agent: Please be advised that RX refills may take up to 3 business days. We ask that you follow-up with your pharmacy.

## 2019-04-02 NOTE — Telephone Encounter (Signed)
Requested medication (s) are due for refill today: yes  Requested medication (s) are on the active medication list: yes  Last refill: 03/05/2019  Future visit scheduled: yes  Notes to clinic:  not delegated    Requested Prescriptions  Pending Prescriptions Disp Refills   HYDROcodone-acetaminophen (NORCO) 10-325 MG tablet 60 tablet 0    Sig: Take 1 tablet by mouth every 8 (eight) hours as needed for severe pain.      Not Delegated - Analgesics:  Opioid Agonist Combinations Failed - 04/02/2019 12:36 PM      Failed - This refill cannot be delegated      Failed - Urine Drug Screen completed in last 360 days.      Passed - Valid encounter within last 6 months    Recent Outpatient Visits           2 months ago Sinus pressure   [facility] [place], Dionne Bucy, MD   4 months ago Elevated blood pressure reading   [place], [place]   1 year ago Chronic narcotic use   [facility] [place], [place], [place]   1 year ago Neuritis or radiculitis due to rupture of lumbar intervertebral disc   [facility] [place], Clearnce Sorrel, [place]   2 years ago Medicare annual wellness visit, subsequent   [place], Clearnce Sorrel, PA-C       Future Appointments             In 3 months  [employer] [employer], [place]   In 5 months [place], Clearnce Sorrel, PA-C [employer] [employer], [place]

## 2019-04-03 MED ORDER — HYDROCODONE-ACETAMINOPHEN 10-325 MG PO TABS: 1.0000 | ORAL_TABLET | Freq: Three times a day (TID) | ORAL | 0 refills | Status: DC | PRN

## 2019-04-10 ENCOUNTER — Ambulatory Visit
Admission: RE | Admit: 2019-04-10 | Discharge: 2019-04-10 | Disposition: A | Discharge status: Home / Self Care | Payer: Medicare Other | Source: Ambulatory Visit | Attending: Physician Assistant | Admitting: Physician Assistant

## 2019-04-10 DIAGNOSIS — Z1231 Encounter for screening mammogram for malignant neoplasm of breast: Secondary | ICD-10-CM

## 2019-04-10 DIAGNOSIS — E611 Iron deficiency: Secondary | ICD-10-CM | POA: Diagnosis not present

## 2019-04-11 ENCOUNTER — Telehealth: Payer: Self-pay | Admitting: *Deleted

## 2019-04-11 ENCOUNTER — Telehealth: Payer: Self-pay

## 2019-04-11 LAB — CBC WITH DIFFERENTIAL/PLATELET
Basophils Absolute: 0 10*3/uL (ref 0.0–0.2)
Basos: 1 %
EOS (ABSOLUTE): 0.1 10*3/uL (ref 0.0–0.4)
Eos: 2 %
Hematocrit: 31.8 % — ABNORMAL LOW (ref 34.0–46.6)
Hemoglobin: 10.5 g/dL — ABNORMAL LOW (ref 11.1–15.9)
Immature Grans (Abs): 0 10*3/uL (ref 0.0–0.1)
Immature Granulocytes: 0 %
Lymphocytes Absolute: 2.8 10*3/uL (ref 0.7–3.1)
Lymphs: 53 %
MCH: 27.5 pg (ref 26.6–33.0)
MCHC: 33 g/dL (ref 31.5–35.7)
MCV: 83 fL (ref 79–97)
Monocytes Absolute: 0.4 10*3/uL (ref 0.1–0.9)
Monocytes: 8 %
Neutrophils Absolute: 1.9 10*3/uL (ref 1.4–7.0)
Neutrophils: 36 %
Platelets: 310 10*3/uL (ref 150–450)
RBC: 3.82 x10E6/uL (ref 3.77–5.28)
RDW: 14 % (ref 11.7–15.4)
WBC: 5.2 10*3/uL (ref 3.4–10.8)

## 2019-04-11 NOTE — Telephone Encounter (Signed)
Lmtcb, pec may give results.

## 2019-04-11 NOTE — Telephone Encounter (Signed)
Copied from [place] [id number]. Topic: General - Inquiry >> Apr 11, 2019  3:06 PM Mathis Bud wrote: Reason for CRM: Patient states she got another missed call from office regarding her iron.  Patient did not say who called her. Call back [phone]

## 2019-04-11 NOTE — Telephone Encounter (Signed)
-----   Message from Mar Daring, PA-C sent at 04/11/2019  8:29 AM EST ----- Hemoglobin dropped a little again. Is she taking the iron supplement and if so how many per day?

## 2019-04-11 NOTE — Telephone Encounter (Signed)
-----   Message from Mar Daring, PA-C sent at 04/11/2019  8:29 AM EST ----- Normal mammogram. Repeat screening in one year.

## 2019-04-11 NOTE — Telephone Encounter (Signed)
Have her increase to twice daily if she can tolerate.

## 2019-04-11 NOTE — Telephone Encounter (Signed)
LMTCB ok for the PEC to give message

## 2019-04-11 NOTE — Telephone Encounter (Signed)
Patient reports she is taking Ferrous Sulfate 65mg  daily.

## 2019-04-11 NOTE — Telephone Encounter (Signed)
Pt returned call for mammogram results. Message given to patient with verbal understanding.

## 2019-04-11 NOTE — Telephone Encounter (Signed)
Lmtcb, PEC may give result.

## 2019-04-12 NOTE — Telephone Encounter (Signed)
Pt given message per Fenton Malling; the pt verbalized understanding, but would like to know how long she is to take this medication; she would also like to know when she should come back for a re-check; the pt can be contacted at [phone]; will route to office for final disposition.

## 2019-04-12 NOTE — Telephone Encounter (Signed)
We can recheck in 8 weeks. Length of therapy is dependent on how her body absorbs and how her hempglobin responds.

## 2019-04-12 NOTE — Telephone Encounter (Signed)
Patient advised as directed below. 

## 2019-05-07 ENCOUNTER — Telehealth: Payer: Self-pay | Admitting: Physician Assistant

## 2019-05-07 DIAGNOSIS — D508 Other iron deficiency anemias: Secondary | ICD-10-CM

## 2019-05-07 NOTE — Telephone Encounter (Signed)
Pt notified and lab message :Iron labs ordered. May need to try ferrous gluconate instead of ferrous sulfate to see if that helps lessen the GI side effects. Message given to pt with verbal understanding. She will have the labs drawn tomorrow and pick up the ferrous gluconate today.

## 2019-05-07 NOTE — Telephone Encounter (Signed)
Copied from [place] [id number]. Topic: General - Other >> May 07, 2019  8:15 AM Keene Breath wrote: Reason for CRM: Patient called to let the doctor know that she is ready to have her iron checked.  She stated that she has not been taking it for the last several days.  It has been giving her indigestion.  Please advise and call back at [phone]

## 2019-05-07 NOTE — Telephone Encounter (Signed)
LMTCB 05/07/2019.  Please advised pt below when she calls back.     Thanks,   -Mickel Baas

## 2019-05-07 NOTE — Telephone Encounter (Signed)
Iron labs ordered. May need to try ferrous gluconate instead of ferrous sulfate to see if that helps lessen the GI side effects.

## 2019-05-08 DIAGNOSIS — D508 Other iron deficiency anemias: Secondary | ICD-10-CM | POA: Diagnosis not present

## 2019-05-09 ENCOUNTER — Other Ambulatory Visit: Payer: Self-pay | Admitting: Physician Assistant

## 2019-05-09 DIAGNOSIS — M1990 Unspecified osteoarthritis, unspecified site: Secondary | ICD-10-CM

## 2019-05-09 LAB — CBC
Hematocrit: 33.2 % — ABNORMAL LOW (ref 34.0–46.6)
Hemoglobin: 10.7 g/dL — ABNORMAL LOW (ref 11.1–15.9)
MCH: 27.3 pg (ref 26.6–33.0)
MCHC: 32.2 g/dL (ref 31.5–35.7)
MCV: 85 fL (ref 79–97)
Platelets: 308 10*3/uL (ref 150–450)
RBC: 3.92 x10E6/uL (ref 3.77–5.28)
RDW: 14.3 % (ref 11.7–15.4)
WBC: 6.6 10*3/uL (ref 3.4–10.8)

## 2019-05-09 LAB — IRON,TIBC AND FERRITIN PANEL
Ferritin: 89 ng/mL (ref 15–150)
Iron Saturation: 24 % (ref 15–55)
Iron: 64 ug/dL (ref 27–139)
Total Iron Binding Capacity: 269 ug/dL (ref 250–450)
UIBC: 205 ug/dL (ref 118–369)

## 2019-05-09 MED ORDER — HYDROCODONE-ACETAMINOPHEN 10-325 MG PO TABS: 1.0000 | ORAL_TABLET | Freq: Three times a day (TID) | ORAL | 0 refills | Status: DC | PRN

## 2019-05-09 NOTE — Telephone Encounter (Signed)
Medication Refill - Medication:  HYDROcodone-acetaminophen (NORCO) 10-325 MG tablet   Has the patient contacted their pharmacy?  Yes advised to call.   Preferred Pharmacy (with phone number or street name):  [place], [place]. Phone:  [phone]  Fax:  [phone]       Agent: Please be advised that RX refills may take up to 3 business days. We ask that you follow-up with your pharmacy.

## 2019-05-09 NOTE — Telephone Encounter (Signed)
Refill request for HYDROcodone-acetaminophen (NORCO) 10-325 MG tablet.

## 2019-06-03 DIAGNOSIS — F410 Panic disorder [episodic paroxysmal anxiety] without agoraphobia: Secondary | ICD-10-CM | POA: Diagnosis not present

## 2019-06-03 DIAGNOSIS — Z79899 Other long term (current) drug therapy: Secondary | ICD-10-CM | POA: Diagnosis not present

## 2019-06-14 ENCOUNTER — Other Ambulatory Visit: Payer: Self-pay | Admitting: Physician Assistant

## 2019-06-14 DIAGNOSIS — M1990 Unspecified osteoarthritis, unspecified site: Secondary | ICD-10-CM

## 2019-06-14 MED ORDER — HYDROCODONE-ACETAMINOPHEN 10-325 MG PO TABS: 1.0000 | ORAL_TABLET | Freq: Three times a day (TID) | ORAL | 0 refills | Status: DC | PRN

## 2019-06-14 NOTE — Telephone Encounter (Signed)
Requested medication (s) are due for refill today: yes  Requested medication (s) are on the active medication list: yes  Last refill:  3/4//2021  Future visit scheduled: yes  Notes to clinic:  this refill cannot be delegated    Requested Prescriptions  Pending Prescriptions Disp Refills   HYDROcodone-acetaminophen (NORCO) 10-325 MG tablet 60 tablet 0    Sig: Take 1 tablet by mouth every 8 (eight) hours as needed for severe pain.      Not Delegated - Analgesics:  Opioid Agonist Combinations Failed - 06/14/2019 12:30 PM      Failed - This refill cannot be delegated      Failed - Urine Drug Screen completed in last 360 days.      Passed - Valid encounter within last 6 months    Recent Outpatient Visits           5 months ago Sinus pressure   [facility] [place], Dionne Bucy, MD   6 months ago Elevated blood pressure reading   [place], [place]   1 year ago Chronic narcotic use   [facility] [place], [place], [place]   1 year ago Neuritis or radiculitis due to rupture of lumbar intervertebral disc   [facility] [place], Clearnce Sorrel, [place]   3 years ago Medicare annual wellness visit, subsequent   [facility], Clearnce Sorrel, PA-C       Future Appointments             In 3 weeks  [facility], [place]   In 3 months [place], Clearnce Sorrel, PA-C [facility], [place]

## 2019-06-14 NOTE — Telephone Encounter (Signed)
Medication Refill - Medication: HYDROcodone-acetaminophen ([place]) 10-325 MG tablet [id number]    Preferred Pharmacy (with phone number or street name):  [place], [place] [address]  Phone: [phone] Fax: [phone]    Agent: Please be advised that RX refills may take up to 3 business days. We ask that you follow-up with your pharmacy.     Pt also wanted [place] to know that she is going to get her second dose for the vaccine

## 2019-06-18 ENCOUNTER — Other Ambulatory Visit: Payer: Self-pay | Admitting: Physician Assistant

## 2019-06-18 DIAGNOSIS — F411 Generalized anxiety disorder: Secondary | ICD-10-CM

## 2019-07-02 ENCOUNTER — Other Ambulatory Visit: Payer: Self-pay

## 2019-07-02 DIAGNOSIS — F411 Generalized anxiety disorder: Secondary | ICD-10-CM

## 2019-07-02 NOTE — Telephone Encounter (Signed)
Called pt to confirm upcoming AWV. After confirming apt, pt wanted to know why her refill request was denied for Xanax. Pt states the pharmacy just called her and told her the request was denied. No telephone encounter on file regarding this. Please advise, thank you.

## 2019-07-03 DIAGNOSIS — D481 Neoplasm of uncertain behavior of connective and other soft tissue: Secondary | ICD-10-CM | POA: Diagnosis not present

## 2019-07-03 NOTE — Telephone Encounter (Signed)
Patient failed urine drug screen. She had no benzodiazepines in the urine. For it to be prescribed 3 times daily and she refills every 30 days it should have been present. So it seems she is not taking the medication as prescribed.

## 2019-07-09 ENCOUNTER — Other Ambulatory Visit: Payer: Self-pay | Admitting: Physician Assistant

## 2019-07-09 DIAGNOSIS — F411 Generalized anxiety disorder: Secondary | ICD-10-CM

## 2019-07-09 NOTE — Progress Notes (Signed)
Subjective:   Patricia Decker is a 64 y.o. female who presents for Medicare Annual (Subsequent) preventive examination.    This visit is being conducted through telemedicine due to the COVID-19 pandemic. This patient has given me verbal consent via doximity to conduct this visit, patient states they are participating from their home address. Some vital signs may be absent or patient reported.    Patient identification: identified by name, DOB, and current address  Review of Systems:  N/A  Cardiac Risk Factors include: hypertension;advanced age (>23men, >15 women)     Objective:     Vitals: There were no vitals taken for this visit.  There is no height or weight on file to calculate BMI. Unable to obtain vitals due to visit being conducted via telephonically.   Advanced Directives 07/10/2019 07/04/2018 06/22/2017 06/30/2016 06/11/2015  Does Patient Have a Medical Advance Directive? No No No No No  Would patient like information on creating a medical advance directive? No - Patient declined No - Patient declined Yes (MAU/Ambulatory/Procedural Areas - Information given) - -    Tobacco Social History   Tobacco Use  Smoking Status Never Smoker  Smokeless Tobacco Never Used     Counseling given: Not Answered   Clinical Intake:  Pre-visit preparation completed: Yes  Pain : 0-10 Pain Score: 7  Pain Type: Chronic pain Pain Location: Hip Pain Orientation: Left Pain Descriptors / Indicators: Aching Pain Frequency: Constant     Nutritional Risks: None Diabetes: No  How often do you need to have someone help you when you read instructions, pamphlets, or other written materials from your doctor or pharmacy?: 1 - Never  Interpreter Needed?: No  Information entered by :: [facility], LPN  Past Medical History:  Diagnosis Date  . Anemia   . Anxiety   . Arthritis   . Asthma   . Cataract    removed  . Depression    Past Surgical History:  Procedure Laterality Date  .  ABDOMINAL HYSTERECTOMY    . APPENDECTOMY    . CATARACT EXTRACTION, BILATERAL    . CHOLECYSTECTOMY    . COLONOSCOPY WITH PROPOFOL N/A 06/30/2016   Procedure: COLONOSCOPY WITH PROPOFOL;  Surgeon: Jonathon Bellows, MD;  Location: [facility]COPY;  Service: Endoscopy;  Laterality: N/A;   Family History  Problem Relation Age of Onset  . Alzheimer's disease Sister   . Breast cancer Sister 20   Social History   Socioeconomic History  . Marital status: Widowed    Spouse name: Not on file  . Number of children: 1  . Years of education: Not on file  . Highest education level: Some college, no degree  Occupational History  . Occupation: disability  Tobacco Use  . Smoking status: Never Smoker  . Smokeless tobacco: Never Used  Substance and Sexual Activity  . Alcohol use: No  . Drug use: No  . Sexual activity: Not on file  Other Topics Concern  . Not on file  Social History Narrative  . Not on file   Social Determinants of Health   Financial Resource Strain: Low Risk   . Difficulty of Paying Living Expenses: Not hard at all  Food Insecurity: No Food Insecurity  . Worried About Charity fundraiser in the Last Year: Never true  . Ran Out of Food in the Last Year: Never true  Transportation Needs: No Transportation Needs  . Lack of Transportation (Medical): No  . Lack of Transportation (Non-Medical): No  Physical Activity: Inactive  .  Days of Exercise per Week: 0 days  . Minutes of Exercise per Session: 0 min  Stress: Stress Concern Present  . Feeling of Stress : To some extent  Social Connections: Moderately Isolated  . Frequency of Communication with Friends and Family: Once a week  . Frequency of Social Gatherings with Friends and Family: Once a week  . Attends Religious Services: More than 4 times per year  . Active Member of Clubs or Organizations: No  . Attends Archivist Meetings: Never  . Marital Status: Widowed    Outpatient Encounter Medications as of 07/10/2019    Medication Sig  . ALPRAZolam (XANAX) 1 MG tablet TAKE 1 TABLET BY MOUTH 3 TIMES DAILY AS NEEDED FOR ANXIETY  . ARIPIPRAZOLE PO Take 15 mg by mouth daily.   Marland Kitchen augmented betamethasone dipropionate (DIPROLENE-AF) 0.05 % cream Apply topically as needed.   Marland Kitchen HYDROcodone-acetaminophen (NORCO) 10-325 MG tablet Take 1 tablet by mouth every 8 (eight) hours as needed for severe pain.  . hydrOXYzine (VISTARIL) 50 MG capsule Take 50 mg by mouth as needed (for itching).   . venlafaxine XR (EFFEXOR XR) 150 MG 24 hr capsule Take 1 capsule by mouth 2 (two) times daily.   . diclofenac (VOLTAREN) 75 MG EC tablet Take 75 mg by mouth 2 (two) times daily.   Marland Kitchen estradiol (ESTRACE) 1 MG tablet Take 1 tablet (1 mg total) by mouth every other day. (Patient not taking: Reported on 07/10/2019)   No facility-administered encounter medications on file as of 07/10/2019.    Activities of Daily Living In your present state of health, do you have any difficulty performing the following activities: 07/10/2019 03/21/2019  Hearing? N N  Vision? N N  Difficulty concentrating or making decisions? N N  Walking or climbing stairs? Y N  Comment Due to hip pains. -  Dressing or bathing? N N  Doing errands, shopping? N N  Preparing Food and eating ? N -  Using the Toilet? N -  In the past six months, have you accidently leaked urine? N -  Do you have problems with loss of bowel control? N -  Managing your Medications? N -  Managing your Finances? N -  Housekeeping or managing your Housekeeping? N -  Some recent data might be hidden    Patient Care Team: Mar Daring, PA-C as PCP - General (Family Medicine) Bernita Raisin, MD as Consulting Physician Earnestine Leys, MD as Consulting Physician (Orthopedic Surgery)    Assessment:   This is a routine wellness examination for Patricia Decker.  Exercise Activities and Dietary recommendations Current Exercise Habits: The patient does not participate in regular exercise at  present, Exercise limited by: orthopedic condition(s)  Goals    . DIET - INCREASE WATER INTAKE     Recommend to drink at least 6-8 8oz glasses of water per day.       Fall Risk: Fall Risk  07/10/2019 03/21/2019 07/04/2018 06/22/2017 06/11/2015  Falls in the past year? 0 0 0 No No  Number falls in past yr: 0 0 - - -  Injury with Fall? 0 0 - - -  Follow up - Falls evaluation completed - - -    FALL RISK PREVENTION PERTAINING TO THE HOME:  Any stairs in or around the home? Yes  If so, are there any without handrails? No   Home free of loose throw rugs in walkways, pet beds, electrical cords, etc? Yes  Adequate lighting in your home to reduce  risk of falls? Yes   ASSISTIVE DEVICES UTILIZED TO PREVENT FALLS:  Life alert? No  Use of a cane, walker or w/c? No  Grab bars in the bathroom? No  Shower chair or bench in shower? No  Elevated toilet seat or a handicapped toilet? No    TIMED UP AND GO:  Was the test performed? No .    Depression Screen PHQ 2/9 Scores 07/10/2019 03/21/2019 07/04/2018 06/22/2017  PHQ - 2 Score 1 2 0 2  PHQ- 9 Score - 3 - 5     Cognitive Function     6CIT Screen 07/10/2019 06/22/2017  What Year? 0 points 0 points  What month? 0 points 0 points  What time? 0 points 0 points  Count back from 20 0 points 0 points  Months in reverse 0 points 0 points  Repeat phrase 0 points 0 points  Total Score 0 0     There is no immunization history on file for this patient.  Qualifies for Shingles Vaccine? Yes . Due for Shingrix. Pt has been advised to call insurance company to determine out of pocket expense. Advised may also receive vaccine at local pharmacy or Health Dept. Verbalized acceptance and understanding.  Tdap: Although this vaccine is not a covered service during a Wellness Exam, does the patient still wish to receive this vaccine today?  No . Advised may receive this vaccine at local pharmacy or Health Dept. Aware to provide a copy of the vaccination  record if obtained from local pharmacy or Health Dept. Verbalized acceptance and understanding.  Flu Vaccine: Due fall 2021   Screening Tests Health Maintenance  Topic Date Due  . TETANUS/TDAP  11/21/2019 (Originally 05/10/1974)  . INFLUENZA VACCINE  10/06/2019  . PAP SMEAR-Modifier  06/10/2020  . MAMMOGRAM  04/09/2021  . COLONOSCOPY  07/01/2026  . Hepatitis C Screening  Completed  . HIV Screening  Completed    Cancer Screenings:  Colorectal Screening: Completed 06/30/16. Repeat every 10 years.   Mammogram: Completed 04/10/19. Repeat every 1-2 years as advised.   Lung Cancer Screening: (Low Dose CT Chest recommended if Age 55-80 years, 30 pack-year currently smoking OR have quit w/in 15years.) does not qualify.   Additional Screening:  Hepatitis C Screening: Up to date  Vision Screening: Recommended annual ophthalmology exams for early detection of glaucoma and other disorders of the eye.  Dental Screening: Recommended annual dental exams for proper oral hygiene  Community Resource Referral:  CRR required this visit?  No       Plan:  I have personally reviewed and addressed the Medicare Annual Wellness questionnaire and have noted the following in the patient's chart:  A. Medical and social history B. Use of alcohol, tobacco or illicit drugs  C. Current medications and supplements D. Functional ability and status E.  Nutritional status F.  Physical activity G. Advance directives H. List of other physicians I.  Hospitalizations, surgeries, and ER visits in previous 12 months J.  [place] such as hearing and vision if needed, cognitive and depression L. Referrals and appointments   In addition, I have reviewed and discussed with patient certain preventive protocols, quality metrics, and best practice recommendations. A written personalized care plan for preventive services as well as general preventive health recommendations were provided to patient. Nurse  Health Advisor  Signed,    Ilario Dhaliwal [place], [place]  [id number] Nurse Health Advisor   Nurse Notes: None.

## 2019-07-10 ENCOUNTER — Other Ambulatory Visit: Payer: Self-pay

## 2019-07-10 ENCOUNTER — Ambulatory Visit (INDEPENDENT_AMBULATORY_CARE_PROVIDER_SITE_OTHER): Payer: Medicare Other

## 2019-07-10 DIAGNOSIS — Z0000 Encounter for general adult medical examination without abnormal findings: Secondary | ICD-10-CM

## 2019-07-10 NOTE — Patient Instructions (Signed)
Patricia Decker , Thank you for taking time to come for your Medicare Wellness Visit. I appreciate your ongoing commitment to your health goals. Please review the following plan we discussed and let me know if I can assist you in the future.   Screening recommendations/referrals: Colonoscopy: Up to date, due 06/2026 Mammogram: Up to date, due 04/2021 Recommended yearly ophthalmology/optometry visit for glaucoma screening and checkup Recommended yearly dental visit for hygiene and checkup  Vaccinations: Influenza vaccine: Due fall 2021 Tdap vaccine: Pt declines today.  Shingles vaccine: Pt declines today.     Advanced directives: Advance directive discussed with you today. Even though you declined this today please call our office should you change your mind and we can give you the proper paperwork for you to fill out.  Conditions/risks identified: Recommend to continue to increase water intake to 6-8 8 oz glasses a day.   Next appointment: 09/19/19 @ 8:20 AM with Fenton Malling. Declined scheduling an AWV for 2022 at this time.   Preventive Care 40-64 Years, Female Preventive care refers to lifestyle choices and visits with your health care provider that can promote health and wellness. What does preventive care include?  A yearly physical exam. This is also called an annual well check.  Dental exams once or twice a year.  Routine eye exams. Ask your health care provider how often you should have your eyes checked.  Personal lifestyle choices, including:  Daily care of your teeth and gums.  Regular physical activity.  Eating a healthy diet.  Avoiding tobacco and drug use.  Limiting alcohol use.  Practicing safe sex.  Taking low-dose aspirin daily starting at age 30.  Taking vitamin and mineral supplements as recommended by your health care provider. What happens during an annual well check? The services and screenings done by your health care provider during your annual well  check will depend on your age, overall health, lifestyle risk factors, and family history of disease. Counseling  Your health care provider may ask you questions about your:  Alcohol use.  Tobacco use.  Drug use.  Emotional well-being.  Home and relationship well-being.  Sexual activity.  Eating habits.  Work and work Statistician.  Method of birth control.  Menstrual cycle.  Pregnancy history. Screening  You may have the following tests or measurements:  Height, weight, and BMI.  Blood pressure.  Lipid and cholesterol levels. These may be checked every 5 years, or more frequently if you are over 89 years old.  Skin check.  Lung cancer screening. You may have this screening every year starting at age 57 if you have a 30-pack-year history of smoking and currently smoke or have quit within the past 15 years.  Fecal occult blood test (FOBT) of the stool. You may have this test every year starting at age 52.  Flexible sigmoidoscopy or colonoscopy. You may have a sigmoidoscopy every 5 years or a colonoscopy every 10 years starting at age 39.  Hepatitis C blood test.  Hepatitis B blood test.  Sexually transmitted disease (STD) testing.  Diabetes screening. This is done by checking your blood sugar (glucose) after you have not eaten for a while (fasting). You may have this done every 1-3 years.  Mammogram. This may be done every 1-2 years. Talk to your health care provider about when you should start having regular mammograms. This may depend on whether you have a family history of breast cancer.  BRCA-related cancer screening. This may be done if you have a  family history of breast, ovarian, tubal, or peritoneal cancers.  Pelvic exam and Pap test. This may be done every 3 years starting at age 30. Starting at age 31, this may be done every 5 years if you have a Pap test in combination with an HPV test.  Bone density scan. This is done to screen for osteoporosis. You  may have this scan if you are at high risk for osteoporosis. Discuss your test results, treatment options, and if necessary, the need for more tests with your health care provider. Vaccines  Your health care provider may recommend certain vaccines, such as:  Influenza vaccine. This is recommended every year.  Tetanus, diphtheria, and acellular pertussis (Tdap, Td) vaccine. You may need a Td booster every 10 years.  Zoster vaccine. You may need this after age 38.  Pneumococcal 13-valent conjugate (PCV13) vaccine. You may need this if you have certain conditions and were not previously vaccinated.  Pneumococcal polysaccharide (PPSV23) vaccine. You may need one or two doses if you smoke cigarettes or if you have certain conditions. Talk to your health care provider about which screenings and vaccines you need and how often you need them. This information is not intended to replace advice given to you by your health care provider. Make sure you discuss any questions you have with your health care provider. Document Released: 03/20/2015 Document Revised: 11/11/2015 Document Reviewed: 12/23/2014 Elsevier Interactive Patient Education  2017 [employer] Prevention in the Home Falls can cause injuries. They can happen to people of all ages. There are many things you can do to make your home safe and to help prevent falls. What can I do on the outside of my home?  Regularly fix the edges of walkways and driveways and fix any cracks.  Remove anything that might make you trip as you walk through a door, such as a raised step or threshold.  Trim any bushes or trees on the path to your home.  Use bright outdoor lighting.  Clear any walking paths of anything that might make someone trip, such as rocks or tools.  Regularly check to see if handrails are loose or broken. Make sure that both sides of any steps have handrails.  Any raised decks and porches should have guardrails on the  edges.  Have any leaves, snow, or ice cleared regularly.  Use sand or salt on walking paths during winter.  Clean up any spills in your garage right away. This includes oil or grease spills. What can I do in the bathroom?  Use night lights.  Install grab bars by the toilet and in the tub and shower. Do not use towel bars as grab bars.  Use non-skid mats or decals in the tub or shower.  If you need to sit down in the shower, use a plastic, non-slip stool.  Keep the floor dry. Clean up any water that spills on the floor as soon as it happens.  Remove soap buildup in the tub or shower regularly.  Attach bath mats securely with double-sided non-slip rug tape.  Do not have throw rugs and other things on the floor that can make you trip. What can I do in the bedroom?  Use night lights.  Make sure that you have a light by your bed that is easy to reach.  Do not use any sheets or blankets that are too big for your bed. They should not hang down onto the floor.  Have a firm  chair that has side arms. You can use this for support while you get dressed.  Do not have throw rugs and other things on the floor that can make you trip. What can I do in the kitchen?  Clean up any spills right away.  Avoid walking on wet floors.  Keep items that you use a lot in easy-to-reach places.  If you need to reach something above you, use a strong step stool that has a grab bar.  Keep electrical cords out of the way.  Do not use floor polish or wax that makes floors slippery. If you must use wax, use non-skid floor wax.  Do not have throw rugs and other things on the floor that can make you trip. What can I do with my stairs?  Do not leave any items on the stairs.  Make sure that there are handrails on both sides of the stairs and use them. Fix handrails that are broken or loose. Make sure that handrails are as long as the stairways.  Check any carpeting to make sure that it is firmly  attached to the stairs. Fix any carpet that is loose or worn.  Avoid having throw rugs at the top or bottom of the stairs. If you do have throw rugs, attach them to the floor with carpet tape.  Make sure that you have a light switch at the top of the stairs and the bottom of the stairs. If you do not have them, ask someone to add them for you. What else can I do to help prevent falls?  Wear shoes that:  Do not have high heels.  Have rubber bottoms.  Are comfortable and fit you well.  Are closed at the toe. Do not wear sandals.  If you use a stepladder:  Make sure that it is fully opened. Do not climb a closed stepladder.  Make sure that both sides of the stepladder are locked into place.  Ask someone to hold it for you, if possible.  Clearly mark and make sure that you can see:  Any grab bars or handrails.  First and last steps.  Where the edge of each step is.  Use tools that help you move around (mobility aids) if they are needed. These include:  Canes.  Walkers.  Scooters.  Crutches.  Turn on the lights when you go into a dark area. Replace any light bulbs as soon as they burn out.  Set up your furniture so you have a clear path. Avoid moving your furniture around.  If any of your floors are uneven, fix them.  If there are any pets around you, be aware of where they are.  Review your medicines with your doctor. Some medicines can make you feel dizzy. This can increase your chance of falling. Ask your doctor what other things that you can do to help prevent falls. This information is not intended to replace advice given to you by your health care provider. Make sure you discuss any questions you have with your health care provider. Document Released: 12/18/2008 Document Revised: 07/30/2015 Document Reviewed: 03/28/2014 Elsevier Interactive Patient Education  2017 [employer].

## 2019-07-12 ENCOUNTER — Other Ambulatory Visit: Payer: Self-pay | Admitting: Physician Assistant

## 2019-07-12 DIAGNOSIS — F411 Generalized anxiety disorder: Secondary | ICD-10-CM

## 2019-07-15 ENCOUNTER — Telehealth: Payer: Self-pay

## 2019-07-15 DIAGNOSIS — F411 Generalized anxiety disorder: Secondary | ICD-10-CM

## 2019-07-15 NOTE — Telephone Encounter (Signed)
Patient had no benzodiazepine on her urine drug screen. She is suppose to have been taking TID. She gets filled monthly. There is no reason there was no benzodiazepine on her urine drug screen as it appeared she was taking. So unfortunately I cannot refill this medication.

## 2019-07-15 NOTE — Telephone Encounter (Signed)
Tired calling patient to advise of message and no answer. Left patient a message to call the office back,if patient returns call okay for PEC to advise patient of the message.

## 2019-07-15 NOTE — Telephone Encounter (Signed)
Copied from [place] [id number]. Topic: General - Inquiry >> Jul 12, 2019  5:14 PM Alease Frame wrote: Reason for CRM: Pt medication was recently denied. Pt states she needs her medication. Please advise ALPRAZolam (XANAX) 1 MG tablet [id number]

## 2019-07-16 NOTE — Telephone Encounter (Signed)
Attempted to call pt.  Left vm to return call to the office to receive recommendation from PCP.

## 2019-07-22 NOTE — Telephone Encounter (Signed)
Pt's PCP is out of the office until 07/29/2019. LMTCB 07/22/2019.  Please advise pt when she calls back.   Thanks,   -Mickel Baas

## 2019-07-22 NOTE — Telephone Encounter (Signed)
Pt. Called back and given message from PCP.Pt. states she does "not understand why she won't refill it. I only take it when I need it, and now I'm itching all over because you're not supposed to stop that medicine all of a sudden." Requests PCP call her back.

## 2019-07-22 NOTE — Telephone Encounter (Signed)
Patient returned call and she hung up before the call could be connected to triage nurse. I called her back and left a VM to return the call to the office.

## 2019-07-24 ENCOUNTER — Other Ambulatory Visit: Payer: Self-pay

## 2019-07-24 ENCOUNTER — Encounter: Payer: Self-pay | Admitting: Otolaryngology

## 2019-07-29 ENCOUNTER — Other Ambulatory Visit
Admission: RE | Admit: 2019-07-29 | Discharge: 2019-07-29 | Disposition: A | Discharge status: Home / Self Care | Payer: Medicare Other | Source: Ambulatory Visit | Attending: Otolaryngology | Admitting: Otolaryngology

## 2019-07-29 ENCOUNTER — Other Ambulatory Visit: Payer: Self-pay

## 2019-07-29 DIAGNOSIS — Z20822 Contact with and (suspected) exposure to covid-19: Secondary | ICD-10-CM | POA: Diagnosis not present

## 2019-07-29 DIAGNOSIS — Z01812 Encounter for preprocedural laboratory examination: Secondary | ICD-10-CM | POA: Diagnosis not present

## 2019-07-29 LAB — SARS CORONAVIRUS 2 (TAT 6-24 HRS): SARS Coronavirus 2: NEGATIVE

## 2019-07-29 MED ORDER — ALPRAZOLAM 1 MG PO TABS: 0.5000 mg | ORAL_TABLET | Freq: Two times a day (BID) | ORAL | 5 refills | Status: DC | PRN

## 2019-07-29 NOTE — Telephone Encounter (Signed)
I will refill this time but I am decreasing the quantity since she only takes as needed.

## 2019-07-29 NOTE — Telephone Encounter (Signed)
LM that provider refilled the medication (Alprazolam) but that she is decreasing the quantity since patient only takes as needed.

## 2019-07-29 NOTE — Addendum Note (Signed)
Addended by: Mar Daring on: 07/29/2019 09:03 AM   Modules accepted: Orders

## 2019-07-30 NOTE — Discharge Instructions (Signed)
General Anesthesia, Adult, Care After This sheet gives you information about how to care for yourself after your procedure. Your health care provider may also give you more specific instructions. If you have problems or questions, contact your health care provider. What can I expect after the procedure? After the procedure, the following side effects are common:  Pain or discomfort at the IV site.  Nausea.  Vomiting.  Sore throat.  Trouble concentrating.  Feeling cold or chills.  Weak or tired.  Sleepiness and fatigue.  Soreness and body aches. These side effects can affect parts of the body that were not involved in surgery. Follow these instructions at home:  For at least 24 hours after the procedure:  Have a responsible adult stay with you. It is important to have someone help care for you until you are awake and alert.  Rest as needed.  Do not: ? Participate in activities in which you could fall or become injured. ? Drive. ? Use heavy machinery. ? Drink alcohol. ? Take sleeping pills or medicines that cause drowsiness. ? Make important decisions or sign legal documents. ? Take care of children on your own. Eating and drinking  Follow any instructions from your health care provider about eating or drinking restrictions.  When you feel hungry, start by eating small amounts of foods that are soft and easy to digest (bland), such as toast. Gradually return to your regular diet.  Drink enough fluid to keep your urine pale yellow.  If you vomit, rehydrate by drinking water, juice, or clear broth. General instructions  If you have sleep apnea, surgery and certain medicines can increase your risk for breathing problems. Follow instructions from your health care provider about wearing your sleep device: ? Anytime you are sleeping, including during daytime naps. ? While taking prescription pain medicines, sleeping medicines, or medicines that make you drowsy.  Return to  your normal activities as told by your health care provider. Ask your health care provider what activities are safe for you.  Take over-the-counter and prescription medicines only as told by your health care provider.  If you smoke, do not smoke without supervision.  Keep all follow-up visits as told by your health care provider. This is important. Contact a health care provider if:  You have nausea or vomiting that does not get better with medicine.  You cannot eat or drink without vomiting.  You have pain that does not get better with medicine.  You are unable to pass urine.  You develop a skin rash.  You have a fever.  You have redness around your IV site that gets worse. Get help right away if:  You have difficulty breathing.  You have chest pain.  You have blood in your urine or stool, or you vomit blood. Summary  After the procedure, it is common to have a sore throat or nausea. It is also common to feel tired.  Have a responsible adult stay with you for the first 24 hours after general anesthesia. It is important to have someone help care for you until you are awake and alert.  When you feel hungry, start by eating small amounts of foods that are soft and easy to digest (bland), such as toast. Gradually return to your regular diet.  Drink enough fluid to keep your urine pale yellow.  Return to your normal activities as told by your health care provider. Ask your health care provider what activities are safe for you. This information is not   intended to replace advice given to you by your health care provider. Make sure you discuss any questions you have with your health care provider. Document Revised: 02/24/2017 Document Reviewed: 10/07/2016 Elsevier Patient Education  2020 Elsevier Inc[employer]  

## 2019-07-31 ENCOUNTER — Encounter
Admission: RE | Disposition: A | Discharge status: Home / Self Care | Payer: Self-pay | Source: Home / Self Care | Attending: Otolaryngology

## 2019-07-31 ENCOUNTER — Other Ambulatory Visit: Payer: Self-pay | Admitting: Physician Assistant

## 2019-07-31 ENCOUNTER — Ambulatory Visit: Payer: Medicare Other | Admitting: Anesthesiology

## 2019-07-31 ENCOUNTER — Other Ambulatory Visit: Payer: Self-pay

## 2019-07-31 ENCOUNTER — Ambulatory Visit
Admission: RE | Admit: 2019-07-31 | Discharge: 2019-07-31 | Disposition: A | Discharge status: Home / Self Care | Payer: Medicare Other | Attending: Otolaryngology | Admitting: Otolaryngology

## 2019-07-31 ENCOUNTER — Encounter: Payer: Self-pay | Admitting: Otolaryngology

## 2019-07-31 DIAGNOSIS — R221 Localized swelling, mass and lump, neck: Secondary | ICD-10-CM | POA: Diagnosis not present

## 2019-07-31 DIAGNOSIS — L720 Epidermal cyst: Secondary | ICD-10-CM | POA: Diagnosis not present

## 2019-07-31 DIAGNOSIS — F419 Anxiety disorder, unspecified: Secondary | ICD-10-CM | POA: Insufficient documentation

## 2019-07-31 DIAGNOSIS — L989 Disorder of the skin and subcutaneous tissue, unspecified: Secondary | ICD-10-CM | POA: Diagnosis present

## 2019-07-31 DIAGNOSIS — F329 Major depressive disorder, single episode, unspecified: Secondary | ICD-10-CM | POA: Insufficient documentation

## 2019-07-31 DIAGNOSIS — H9482 Other specified disorders of left ear in diseases classified elsewhere: Secondary | ICD-10-CM | POA: Diagnosis not present

## 2019-07-31 DIAGNOSIS — M1990 Unspecified osteoarthritis, unspecified site: Secondary | ICD-10-CM

## 2019-07-31 HISTORY — DX: Presence of dental prosthetic device (complete) (partial): Z97.2

## 2019-07-31 HISTORY — PX: EAR CYST EXCISION: SHX22

## 2019-07-31 SURGERY — EXCISION, CYST, EAR
Anesthesia: General | Site: Ear | Laterality: Left

## 2019-07-31 MED ORDER — BACITRACIN 500 UNIT/GM EX OINT
TOPICAL_OINTMENT | CUTANEOUS | Status: DC | PRN
  Administered 2019-07-31: 1 via TOPICAL

## 2019-07-31 MED ORDER — FENTANYL CITRATE (PF) 100 MCG/2ML IJ SOLN: 25.0000 ug | INTRAMUSCULAR | Status: DC | PRN

## 2019-07-31 MED ORDER — ONDANSETRON HCL 4 MG/2ML IJ SOLN
INTRAMUSCULAR | Status: DC | PRN
  Administered 2019-07-31: 4 mg via INTRAVENOUS

## 2019-07-31 MED ORDER — FENTANYL CITRATE (PF) 100 MCG/2ML IJ SOLN
INTRAMUSCULAR | Status: DC | PRN
  Administered 2019-07-31 (×2): 25 ug via INTRAVENOUS

## 2019-07-31 MED ORDER — MIDAZOLAM HCL 5 MG/5ML IJ SOLN
INTRAMUSCULAR | Status: DC | PRN
  Administered 2019-07-31: 2 mg via INTRAVENOUS

## 2019-07-31 MED ORDER — OXYCODONE HCL 5 MG/5ML PO SOLN: 5.0000 mg | Freq: Once | ORAL | Status: DC | PRN

## 2019-07-31 MED ORDER — LACTATED RINGERS IV SOLN
100.0000 mL/h | INTRAVENOUS | Status: DC
  Administered 2019-07-31: 100 mL/h via INTRAVENOUS

## 2019-07-31 MED ORDER — ACETAMINOPHEN 160 MG/5ML PO SOLN: 325.0000 mg | ORAL | Status: DC | PRN

## 2019-07-31 MED ORDER — HYDROCODONE-ACETAMINOPHEN 10-325 MG PO TABS: 1.0000 | ORAL_TABLET | Freq: Three times a day (TID) | ORAL | 0 refills | Status: DC | PRN

## 2019-07-31 MED ORDER — ACETAMINOPHEN 325 MG PO TABS: 325.0000 mg | ORAL_TABLET | ORAL | Status: DC | PRN

## 2019-07-31 MED ORDER — OXYCODONE HCL 5 MG PO TABS: 5.0000 mg | ORAL_TABLET | Freq: Once | ORAL | Status: DC | PRN

## 2019-07-31 MED ORDER — PROPOFOL 10 MG/ML IV BOLUS
INTRAVENOUS | Status: DC | PRN
  Administered 2019-07-31: 150 mg via INTRAVENOUS

## 2019-07-31 MED ORDER — LIDOCAINE HCL (CARDIAC) PF 100 MG/5ML IV SOSY
PREFILLED_SYRINGE | INTRAVENOUS | Status: DC | PRN
  Administered 2019-07-31: 30 mg via INTRATRACHEAL

## 2019-07-31 MED ORDER — DEXAMETHASONE SODIUM PHOSPHATE 4 MG/ML IJ SOLN
INTRAMUSCULAR | Status: DC | PRN
  Administered 2019-07-31: 4 mg via INTRAVENOUS

## 2019-07-31 MED ORDER — LIDOCAINE-EPINEPHRINE 1 %-1:100000 IJ SOLN
INTRAMUSCULAR | Status: DC | PRN
  Administered 2019-07-31: 2 mL

## 2019-07-31 MED ORDER — ONDANSETRON HCL 4 MG PO TABS: 4.0000 mg | ORAL_TABLET | Freq: Three times a day (TID) | ORAL | 0 refills | Status: DC | PRN

## 2019-07-31 MED ORDER — ORAL CARE MOUTH RINSE: 15.0000 mL | Freq: Once | OROMUCOSAL | Status: DC

## 2019-07-31 MED ORDER — CHLORHEXIDINE GLUCONATE 0.12 % MT SOLN: 15.0000 mL | Freq: Once | OROMUCOSAL | Status: DC

## 2019-07-31 SURGICAL SUPPLY — 22 items
APPLICATOR COTTON TIP 3IN (MISCELLANEOUS) ×11 IMPLANT
CORD BIP STRL DISP 12FT (MISCELLANEOUS) IMPLANT
GLOVE BIOGEL PI IND STRL 7.5 (GLOVE) IMPLANT
GLOVE BIOGEL PI INDICATOR 7.5 (GLOVE) ×4
GOWN STRL REUS W/ TWL LRG LVL3 (GOWN DISPOSABLE) ×1 IMPLANT
GOWN STRL REUS W/TWL LRG LVL3 (GOWN DISPOSABLE) ×2
KIT TURNOVER KIT A (KITS) ×3 IMPLANT
NDL HYPO 25GX1X1/2 BEV (NEEDLE) ×1 IMPLANT
NEEDLE HYPO 25GX1X1/2 BEV (NEEDLE) ×3 IMPLANT
NS IRRIG 500ML POUR BTL (IV SOLUTION) ×3 IMPLANT
PACK ENT CUSTOM (PACKS) ×3 IMPLANT
SOL PREP PVP 2OZ (MISCELLANEOUS) ×3
SOLUTION PREP PVP 2OZ (MISCELLANEOUS) ×1 IMPLANT
SPONGE KITTNER 5P (MISCELLANEOUS) ×3 IMPLANT
STRAP BODY AND KNEE 60X3 (MISCELLANEOUS) ×3 IMPLANT
SUCTION FRAZIER HANDLE 10FR (MISCELLANEOUS)
SUCTION TUBE FRAZIER 10FR DISP (MISCELLANEOUS) IMPLANT
SUT PROLENE 6 0 P 1 18 (SUTURE) ×2 IMPLANT
SUT VIC AB 4-0 RB1 27 (SUTURE) ×2
SUT VIC AB 4-0 RB1 27X BRD (SUTURE) ×1 IMPLANT
SYR 10ML LL (SYRINGE) ×3 IMPLANT
SYR EAR/ULCER 2OZ (SYRINGE) ×2 IMPLANT

## 2019-07-31 NOTE — Telephone Encounter (Signed)
Copied from [place] [id number]. Topic: Quick Communication - Rx Refill/Question >> Jul 31, 2019 10:58 AM Leward Quan A wrote: Medication: HYDROcodone-acetaminophen (NORCO) 10-325 MG tablet   Has the patient contacted their pharmacy? No (Agent: If no, request that the patient contact the pharmacy for the refill.) (Agent: If yes, when and what did the pharmacy advise?)  Preferred Pharmacy (with phone number or street name): [facility] - [place], [place].  Phone:  [phone] Fax:  [phone]     Agent: Please be advised that RX refills may take up to 3 business days. We ask that you follow-up with your pharmacy.

## 2019-07-31 NOTE — Op Note (Signed)
..  07/31/2019  8:08 AM    Patricia Decker  [id number]   Pre-Op Dx: Left subcutaneous neck/ear lesion  Post-op Dx: same  Proc:  1)  Excision of left subcutaneous neck lesion 1.5cm  Surg:  Patricia Decker  Anes:  GOT  EBL:  <30ml  Comp:  none  Findings:  Subcutaneous cystic mass emanating from junction of ear lobe and left superior neck.  Circumferentially dissected with pore  Connection to skin removed.  Procedure: After the patient was identified in hold and the history and physical and consent was reviewed and updated. The patient was marked in the normal fashion. The patient was next taken to the operating room and placed in a supine position. General LM anesthesia was induced in the normal fashion.  At this time the patient was prepped and draped in a normal fashion and the left neck/ear lobe junction marked along an infraauricular skin crease.  16ml of 1% lidocaine with 1:100,000 epinephrine was injected into the patient's left ear lobe and left neck.  Using a 15 blade scalpel, a skin incision was made along the previously marked skin crease.  Dissected with 15 blade scalpel and hemostat was used to dissect the cystic mass from the underlying muscular fascia and parotid fascia anteriorly.  A small connecting pore anteriorly at the junction of the ear lobe and superior neck was identified.  The cystic mass was dissected and the connecting pore to anterior cutaneous skin preserved.  This was then truncated with tenotomy scissors removing the mass and cutaneous attachment.  At this time, the wound was copiously irrigated.  The excision wound was closed with interrupted prolene 6.0 anteriorly and posteriorly.  Bacitracin ointment was applied to the incision.  Care of the patient was transferred to Anesthesia who extubated the patient and he was taken to PACU in good condition.   Dispo:   To PACU in Good condition.  Plan:  To follow up in one week for suture removal.  Follow  pathology.  Amrit Erck  07/31/2019 8:08 AM

## 2019-07-31 NOTE — Transfer of Care (Signed)
Immediate Anesthesia Transfer of Care Note  Patient: Patricia Decker  Procedure(s) Performed: EXCISION EAR LESION (Left Ear)  Patient Location: PACU  Anesthesia Type: General  Level of Consciousness: awake, alert  and patient cooperative  Airway and Oxygen Therapy: Patient Spontanous Breathing and Patient connected to supplemental oxygen  Post-op Assessment: Post-op Vital signs reviewed, Patient's Cardiovascular Status Stable, Respiratory Function Stable, Patent Airway and No signs of Nausea or vomiting  Post-op Vital Signs: Reviewed and stable  Complications: No apparent anesthesia complications

## 2019-07-31 NOTE — Telephone Encounter (Signed)
Requested medication (s) are due for refill today: yes  Requested medication (s) are on the active medication list: yes  Last refill:  06/14/2019  Future visit scheduled: yes  Notes to clinic: this refill cannot be delegated    Requested Prescriptions  Pending Prescriptions Disp Refills   HYDROcodone-acetaminophen (NORCO) 10-325 MG tablet 60 tablet 0    Sig: Take 1 tablet by mouth every 8 (eight) hours as needed for severe pain.      Not Delegated - Analgesics:  Opioid Agonist Combinations Failed - 07/31/2019 11:10 AM      Failed - This refill cannot be delegated      Failed - Urine Drug Screen completed in last 360 days.      Passed - Valid encounter within last 6 months    Recent Outpatient Visits           6 months ago Sinus pressure   [facility] [place], Dionne Bucy, MD   8 months ago Elevated blood pressure reading   [place], [place]   2 years ago Chronic narcotic use   [place], [place], [place]   2 years ago Neuritis or radiculitis due to rupture of lumbar intervertebral disc   [facility] [place], Clearnce Sorrel, [place]   3 years ago Medicare annual wellness visit, subsequent   [place], Clearnce Sorrel, [place]       Future Appointments             In 1 month [place], Clearnce Sorrel, PA-C [employer], [place]

## 2019-07-31 NOTE — Anesthesia Procedure Notes (Signed)
Procedure Name: LMA Insertion Date/Time: 07/31/2019 7:34 AM Performed by: Cameron Ali, CRNA Pre-anesthesia Checklist: Patient identified, Emergency Drugs available, Suction available, Timeout performed and Patient being monitored Patient Re-evaluated:Patient Re-evaluated prior to induction Oxygen Delivery Method: Circle system utilized Preoxygenation: Pre-oxygenation with 100% oxygen Induction Type: IV induction LMA: LMA inserted LMA Size: 3.0 Number of attempts: 1 Placement Confirmation: positive ETCO2 and breath sounds checked- equal and bilateral Tube secured with: Tape Dental Injury: Teeth and Oropharynx as per pre-operative assessment

## 2019-07-31 NOTE — Anesthesia Preprocedure Evaluation (Signed)
Anesthesia Evaluation  Patient identified by MRN, date of birth, ID band Patient awake    Reviewed: Allergy & Precautions, NPO status , Patient's Chart, lab work & pertinent test results  Airway Mallampati: II  TM Distance: >3 FB     Dental  (+) Upper Dentures, Lower Dentures   Pulmonary asthma ,    breath sounds clear to auscultation       Cardiovascular negative cardio ROS   Rhythm:Regular Rate:Normal     Neuro/Psych  Headaches, PSYCHIATRIC DISORDERS Anxiety Depression    GI/Hepatic   Endo/Other    Renal/GU      Musculoskeletal  (+) Arthritis ,   Abdominal   Peds  Hematology  (+) anemia ,   Anesthesia Other Findings   Reproductive/Obstetrics                             Anesthesia Physical Anesthesia Plan  ASA: II  Anesthesia Plan: General   Post-op Pain Management:    Induction: Intravenous  PONV Risk Score and Plan: Ondansetron, Dexamethasone, Midazolam and Treatment may vary due to age or medical condition  Airway Management Planned: LMA  Additional Equipment:   Intra-op Plan:   Post-operative Plan:   Informed Consent: I have reviewed the patients History and Physical, chart, labs and discussed the procedure including the risks, benefits and alternatives for the proposed anesthesia with the patient or authorized representative who has indicated his/her understanding and acceptance.     Dental advisory given  Plan Discussed with: CRNA  Anesthesia Plan Comments:         Anesthesia Quick Evaluation

## 2019-07-31 NOTE — Anesthesia Postprocedure Evaluation (Signed)
Anesthesia Post Note  Patient: Patricia Decker  Procedure(s) Performed: EXCISION EAR LESION (Left Ear)     Patient location during evaluation: PACU Anesthesia Type: General Level of consciousness: awake Pain management: pain level controlled Vital Signs Assessment: post-procedure vital signs reviewed and stable Respiratory status: respiratory function stable Cardiovascular status: stable Postop Assessment: no signs of nausea or vomiting Anesthetic complications: no    Veda Canning

## 2019-07-31 NOTE — H&P (Signed)
..  History and Physical paper copy reviewed and updated date of procedure and will be scanned into system.  Patient seen and examined and marked.  

## 2019-08-01 ENCOUNTER — Encounter: Payer: Self-pay | Admitting: *Deleted

## 2019-08-01 LAB — SURGICAL PATHOLOGY

## 2019-08-30 ENCOUNTER — Other Ambulatory Visit: Payer: Self-pay | Admitting: Physician Assistant

## 2019-08-30 DIAGNOSIS — M1990 Unspecified osteoarthritis, unspecified site: Secondary | ICD-10-CM

## 2019-08-30 MED ORDER — HYDROCODONE-ACETAMINOPHEN 10-325 MG PO TABS: 1.0000 | ORAL_TABLET | Freq: Three times a day (TID) | ORAL | 0 refills | Status: DC | PRN

## 2019-08-30 NOTE — Telephone Encounter (Signed)
RX REFILL HYDROcodone-acetaminophen (NORCO) 10-325 MG tablet [[id number]]  [place], [place]. Phone:  [phone]  Fax:  [phone]

## 2019-09-17 NOTE — Progress Notes (Deleted)
     Established patient visit   Patient: Patricia Decker   DOB: 1955-10-05   64 y.o. Female  MRN: [id number] Visit Date: 09/19/2019  Today's healthcare provider: Mar Daring, PA-C   No chief complaint on file.  Subjective    HPI  Follow up for ***  The patient was last seen for this 6 months ago. Changes made at last visit include none, chronic pain management.  She reports {excellent/good/fair/poor:19665} compliance with treatment. She feels that condition is {improved/worse/unchanged:3041574}. She {is/is not:21021397} having side effects. ***  -----------------------------------------------------------------------------------------   {Show patient history (optional):23778::" "}   Medications: Outpatient Medications Prior to Visit  Medication Sig  . ALPRAZolam (XANAX) 1 MG tablet Take 0.5-1 tablets (0.5-1 mg total) by mouth 2 (two) times daily as needed for anxiety.  . ARIPIPRAZOLE PO Take 15 mg by mouth daily.   Marland Kitchen augmented betamethasone dipropionate (DIPROLENE-AF) 0.05 % cream Apply topically as needed.   . diclofenac (VOLTAREN) 75 MG EC tablet Take 75 mg by mouth 2 (two) times daily.   . Ferrous Sulfate (IRON PO) Take by mouth 2 (two) times a week.  Marland Kitchen HYDROcodone-acetaminophen (NORCO) 10-325 MG tablet Take 1 tablet by mouth every 8 (eight) hours as needed for severe pain.  . hydrOXYzine (VISTARIL) 50 MG capsule Take 50 mg by mouth as needed (for itching).   . ondansetron (ZOFRAN) 4 MG tablet Take 1 tablet (4 mg total) by mouth every 8 (eight) hours as needed for up to 10 doses for nausea or vomiting.  . venlafaxine XR (EFFEXOR XR) 150 MG 24 hr capsule Take 1 capsule by mouth 2 (two) times daily.    No facility-administered medications prior to visit.    Review of Systems  {Heme  Chem  Endocrine  Serology  Results Review (optional):23779::" "}  Objective    There were no vitals taken for this visit. {Show previous vital signs (optional):23777::"  "}  Physical Exam  ***  No results found for any visits on 09/19/19.  Assessment & Plan     ***  No follow-ups on file.      {provider attestation***:1}   Rubye Beach  [facility] [phone] (phone) [phone] (fax)  [place]

## 2019-09-19 ENCOUNTER — Ambulatory Visit: Payer: Self-pay | Admitting: Physician Assistant

## 2019-10-07 ENCOUNTER — Other Ambulatory Visit: Payer: Self-pay | Admitting: Physician Assistant

## 2019-10-07 DIAGNOSIS — M1990 Unspecified osteoarthritis, unspecified site: Secondary | ICD-10-CM

## 2019-10-07 MED ORDER — HYDROCODONE-ACETAMINOPHEN 10-325 MG PO TABS: 1.0000 | ORAL_TABLET | Freq: Three times a day (TID) | ORAL | 0 refills | Status: DC | PRN

## 2019-10-07 NOTE — Telephone Encounter (Signed)
Requested medication (s) are due for refill today:  Provider to review  Requested medication (s) are on the active medication list:   Yes  Future visit scheduled:   Yes in 3 days   Last ordered: Non delegated refill    Requested Prescriptions  Pending Prescriptions Disp Refills   HYDROcodone-acetaminophen (NORCO) 10-325 MG tablet 60 tablet 0    Sig: Take 1 tablet by mouth every 8 (eight) hours as needed for severe pain.      Not Delegated - Analgesics:  Opioid Agonist Combinations Failed - 10/07/2019 10:27 AM      Failed - This refill cannot be delegated      Failed - Urine Drug Screen completed in last 360 days.      Passed - Valid encounter within last 6 months    Recent Outpatient Visits           8 months ago Sinus pressure   [facility] [place], Dionne Bucy, [place]   10 months ago Elevated blood pressure reading   [place], [place]   2 years ago Chronic narcotic use   [place], [place], [place]   2 years ago Neuritis or radiculitis due to rupture of lumbar intervertebral disc   [facility] [place], Clearnce Sorrel, [place]   3 years ago Medicare annual wellness visit, subsequent   [employer], Clearnce Sorrel, [place]       Future Appointments             In 3 days Marlyn Corporal, [place], PA-C [employer], [place]

## 2019-10-07 NOTE — Telephone Encounter (Signed)
Copied from [place] [id number]. Topic: Quick Communication - Rx Refill/Question >> Oct 07, 2019 10:19 AM Leward Quan A wrote: Medication: HYDROcodone-acetaminophen (NORCO) 10-325 MG tablet   Has the patient contacted their pharmacy? Yes.   (Agent: If no, request that the patient contact the pharmacy for the refill.) (Agent: If yes, when and what did the pharmacy advise?)  Preferred Pharmacy (with phone number or street name): [facility] [place], [place].  Phone:  [phone] Fax:  [phone]     Agent: Please be advised that RX refills may take up to 3 business days. We ask that you follow-up with your pharmacy.

## 2019-10-09 DIAGNOSIS — M419 Scoliosis, unspecified: Secondary | ICD-10-CM | POA: Insufficient documentation

## 2019-10-09 DIAGNOSIS — M47816 Spondylosis without myelopathy or radiculopathy, lumbar region: Secondary | ICD-10-CM | POA: Insufficient documentation

## 2019-10-09 DIAGNOSIS — M4186 Other forms of scoliosis, lumbar region: Secondary | ICD-10-CM | POA: Diagnosis not present

## 2019-10-09 DIAGNOSIS — M4696 Unspecified inflammatory spondylopathy, lumbar region: Secondary | ICD-10-CM | POA: Diagnosis not present

## 2019-10-10 ENCOUNTER — Ambulatory Visit: Payer: Medicare Other | Admitting: Physician Assistant

## 2019-10-29 NOTE — Progress Notes (Deleted)
{This patient's chart is due for periodic physician review. Please check 'Cosign Required' and forward to your supervising physician.:1}  Established patient visit   Patient: Patricia Decker   DOB: [birth year]   64 y.o. Female  MRN: [id number] Visit Date: 10/31/2019  Today's healthcare provider: Mar Daring, PA-C   No chief complaint on file.  Subjective    HPI  Elevated Blood pressure readings, follow-up  BP Readings from Last 3 Encounters:  07/31/19 (!) 145/74  03/21/19 130/75  11/21/18 (!) 179/81   Wt Readings from Last 3 Encounters:  07/31/19 148 lb (67.1 kg)  03/21/19 148 lb (67.1 kg)  11/21/18 147 lb 6.4 oz (66.9 kg)     She was last seen for hypertension 7 months ago.  BP at that visit was 130/75. Management since that visit includes none. Patient not on BP medicine.  She reports {excellent/good/fair/poor:19665} compliance with treatment. She {is/is not:9024} having side effects. {document side effects if present:1} She is following a {diet:21022986} diet. She {is/is not:9024} exercising. She {does/does not:200015} smoke.  Outside blood pressures are {***enter patient reported home BP readings, or 'not being checked':1}. Symptoms: {Yes/No:20286} chest pain {Yes/No:20286} chest pressure  {Yes/No:20286} palpitations {Yes/No:20286} syncope  {Yes/No:20286} dyspnea {Yes/No:20286} orthopnea  {Yes/No:20286} paroxysmal nocturnal dyspnea {Yes/No:20286} lower extremity edema   Lipid/Cholesterol, Follow-up  Last lipid panel Other pertinent labs  Lab Results  Component Value Date   CHOL 193 03/21/2019   HDL 62 03/21/2019   LDLCALC 118 (H) 03/21/2019   TRIG 72 03/21/2019   CHOLHDL 3.1 03/21/2019   Lab Results  Component Value Date   ALT 13 03/21/2019   AST 20 03/21/2019   PLT 308 05/08/2019   TSH 0.889 06/12/2015     She was last seen for this 7 months ago.  Management since that visit includes none. Diet control.  She reports  {excellent/good/fair/poor:19665} compliance with treatment. She {is/is not:9024} having side effects. {document side effects if present:1}  Symptoms: {Yes/No:20286} chest pain {Yes/No:20286} chest pressure/discomfort  {Yes/No:20286} dyspnea {Yes/No:20286} lower extremity edema  {Yes/No:20286} numbness or tingling of extremity {Yes/No:20286} orthopnea  {Yes/No:20286} palpitations {Yes/No:20286} paroxysmal nocturnal dyspnea  {Yes/No:20286} speech difficulty {Yes/No:20286} syncope    The 10-year ASCVD risk score Mikey Bussing DC Jr., et al., 2013) is: 7.8%  --------------------------------------------------------------------------------------------------- {Show patient history (optional):23778::" "}   Medications: Outpatient Medications Prior to Visit  Medication Sig  . ALPRAZolam (XANAX) 1 MG tablet Take 0.5-1 tablets (0.5-1 mg total) by mouth 2 (two) times daily as needed for anxiety.  . ARIPIPRAZOLE PO Take 15 mg by mouth daily.   Marland Kitchen augmented betamethasone dipropionate (DIPROLENE-AF) 0.05 % cream Apply topically as needed.   . diclofenac (VOLTAREN) 75 MG EC tablet Take 75 mg by mouth 2 (two) times daily.   . Ferrous Sulfate (IRON PO) Take by mouth 2 (two) times a week.  Marland Kitchen HYDROcodone-acetaminophen (NORCO) 10-325 MG tablet Take 1 tablet by mouth every 8 (eight) hours as needed for severe pain.  . hydrOXYzine (VISTARIL) 50 MG capsule Take 50 mg by mouth as needed (for itching).   . ondansetron (ZOFRAN) 4 MG tablet Take 1 tablet (4 mg total) by mouth every 8 (eight) hours as needed for up to 10 doses for nausea or vomiting.  . venlafaxine XR (EFFEXOR XR) 150 MG 24 hr capsule Take 1 capsule by mouth 2 (two) times daily.    No facility-administered medications prior to visit.    Review of Systems  {Heme  Chem  Endocrine  Serology  Results Review (  optional):23779::" "}  Objective    There were no vitals taken for this visit. {Show previous vital signs (optional):23777::" "}  Physical  Exam  ***  No results found for any visits on 10/31/19.  Assessment & Plan     ***  No follow-ups on file.      {provider attestation***:1}   Rubye Beach  [facility] [phone] (phone) [phone] (fax)  [place]

## 2019-10-31 ENCOUNTER — Ambulatory Visit: Payer: Medicare Other | Admitting: Physician Assistant

## 2019-11-13 DIAGNOSIS — F410 Panic disorder [episodic paroxysmal anxiety] without agoraphobia: Secondary | ICD-10-CM | POA: Diagnosis not present

## 2019-11-13 DIAGNOSIS — Z79899 Other long term (current) drug therapy: Secondary | ICD-10-CM | POA: Diagnosis not present

## 2019-11-19 NOTE — Progress Notes (Signed)
Established patient visit   Patient: Patricia Decker   DOB: January 05, 1956   64 y.o. Female  MRN: [id number] Visit Date: 11/20/2019  Today's healthcare provider: Mar Daring, PA-C   Chief Complaint  Patient presents with   Referral   Subjective    HPI  Patient here to see if she can have a referral to a different therapist/psychiatrist. Reports that the one she was seeing is no longer. She reports she had a good relationship with her previous psychiatrist, Dr. Rosine Door. He has been out of the office for a while now and she has seen 2 other providers in the interim. It is unknown if Dr. Rosine Door will return at all. She does not wish to see any other providers at this office as she feels they do not care and just give her to whomever is available. Her last visit with Dr. Rosine Door was on 06/03/19. She was going/doing telephone follow ups every 3 months. Has been stable on medications for a long time.   Patient Declined influenza vaccine.  Patient Active Problem List   Diagnosis Date Noted   Hypercholesterolemia 11/22/2018   Elevated blood pressure reading 11/21/2018   Arthritis 11/13/2014   Airway hyperreactivity 11/13/2014   Atypical nevus 49/70/2637   Folliculitis 85/88/5027   Blood in the urine 11/13/2014   Cramp in muscle 11/13/2014   Bee sting reaction 11/13/2014   Neuritis or radiculitis due to rupture of lumbar intervertebral disc 10/17/2013   Abnormal loss of weight 11/05/2008   Absolute anemia 11/05/2008   Menopausal symptom 11/05/2008   Migraine aura without headache 11/05/2008   Anxiety 08/26/2008   Clinical depression 08/26/2008   Past Medical History:  Diagnosis Date   Anemia    Anxiety    Arthritis    Asthma    Cataract    removed   Depression    Wears dentures    Full upper, partial lower       Medications: Outpatient Medications Prior to Visit  Medication Sig   ALPRAZolam (XANAX) 1 MG tablet Take 0.5-1 tablets (0.5-1 mg  total) by mouth 2 (two) times daily as needed for anxiety.   ARIPIPRAZOLE PO Take 15 mg by mouth daily.    augmented betamethasone dipropionate (DIPROLENE-AF) 0.05 % cream Apply topically as needed.    Ferrous Sulfate (IRON PO) Take by mouth 2 (two) times a week.   HYDROcodone-acetaminophen (NORCO) 10-325 MG tablet Take 1 tablet by mouth every 8 (eight) hours as needed for severe pain.   hydrOXYzine (VISTARIL) 50 MG capsule Take 50 mg by mouth as needed (for itching).    venlafaxine XR (EFFEXOR XR) 150 MG 24 hr capsule Take 1 capsule by mouth 2 (two) times daily.    diclofenac (VOLTAREN) 75 MG EC tablet Take 75 mg by mouth 2 (two) times daily.  (Patient not taking: Reported on 11/20/2019)   ondansetron (ZOFRAN) 4 MG tablet Take 1 tablet (4 mg total) by mouth every 8 (eight) hours as needed for up to 10 doses for nausea or vomiting. (Patient not taking: Reported on 11/20/2019)   No facility-administered medications prior to visit.    Review of Systems  Constitutional: Negative.   Respiratory: Negative.   Cardiovascular: Negative.   Musculoskeletal: Positive for arthralgias, back pain, gait problem and myalgias.  Neurological: Positive for weakness.  Psychiatric/Behavioral: Positive for dysphoric mood. The patient is nervous/anxious.     Last CBC Lab Results  Component Value Date   WBC 6.6 05/08/2019  HGB 10.7 (L) 05/08/2019   HCT 33.2 (L) 05/08/2019   MCV 85 05/08/2019   MCH 27.3 05/08/2019   RDW 14.3 05/08/2019   PLT 308 45/05/8880   Last metabolic panel Lab Results  Component Value Date   GLUCOSE 96 03/21/2019   NA 139 03/21/2019   K 4.5 03/21/2019   CL 105 03/21/2019   CO2 21 03/21/2019   BUN 15 03/21/2019   CREATININE 0.86 03/21/2019   GFRNONAA 73 03/21/2019   GFRAA 84 03/21/2019   CALCIUM 9.4 03/21/2019   PROT 6.6 03/21/2019   ALBUMIN 4.1 03/21/2019   LABGLOB 2.5 03/21/2019   AGRATIO 1.6 03/21/2019   BILITOT <0.2 03/21/2019   ALKPHOS 75 03/21/2019   AST  20 03/21/2019   ALT 13 03/21/2019      Objective    BP 131/79 (BP Location: Left Arm, Patient Position: Sitting, Cuff Size: Large)    Pulse 75    Temp 98.2 F (36.8 C) (Oral)    Resp 16    Wt 145 lb (65.8 kg)    BMI 24.13 kg/m  BP Readings from Last 3 Encounters:  11/20/19 131/79  07/31/19 (!) 145/74  03/21/19 130/75   Wt Readings from Last 3 Encounters:  11/20/19 145 lb (65.8 kg)  07/31/19 148 lb (67.1 kg)  03/21/19 148 lb (67.1 kg)      Physical Exam Vitals reviewed.  Constitutional:      General: She is not in acute distress.    Appearance: Normal appearance. She is well-developed.  HENT:     Head: Normocephalic and atraumatic.  Pulmonary:     Effort: Pulmonary effort is normal. No respiratory distress.  Musculoskeletal:     Cervical back: Normal range of motion and neck supple.  Neurological:     Mental Status: She is alert.  Psychiatric:        Attention and Perception: Attention and perception normal.        Mood and Affect: Mood is depressed. Affect is tearful (tearful when talking about how she feels pushed around and let down from previous office after years of care).        Speech: Speech normal.        Behavior: Behavior normal. Behavior is cooperative.        Thought Content: Thought content normal.        Judgment: Judgment normal.      No results found for any visits on 11/20/19.  Assessment & Plan     1. Recurrent major depressive disorder, in partial remission ([place]) New referral placed. Has been stable on medications, but would like to have a stable relationship. Since Dr. Rosine Door has been out of the office, she has had 2 different psychiatrist she has had visits with. It is unknown if Dr. Rosine Door will return and he is at least out for the next 2 months. Patient requesting a new referral for stability.  - Ambulatory referral to Psychiatry  2. Anxiety See above medical treatment plan. - Ambulatory referral to Psychiatry   No follow-ups on file.       Reynolds Bowl, PA-C, have reviewed all documentation for this visit. The documentation on 11/20/19 for the exam, diagnosis, procedures, and orders are all accurate and complete.   Rubye Beach  [facility] [phone] (phone) [phone] (fax)  [place]

## 2019-11-20 ENCOUNTER — Encounter: Payer: Self-pay | Admitting: Physician Assistant

## 2019-11-20 ENCOUNTER — Ambulatory Visit (INDEPENDENT_AMBULATORY_CARE_PROVIDER_SITE_OTHER): Payer: Medicare Other | Admitting: Physician Assistant

## 2019-11-20 ENCOUNTER — Other Ambulatory Visit: Payer: Self-pay

## 2019-11-20 VITALS — BP 131/79 | HR 75 | Temp 98.2°F | Resp 16 | Wt 145.0 lb

## 2019-11-20 DIAGNOSIS — F3341 Major depressive disorder, recurrent, in partial remission: Secondary | ICD-10-CM | POA: Diagnosis not present

## 2019-11-20 DIAGNOSIS — F419 Anxiety disorder, unspecified: Secondary | ICD-10-CM

## 2019-12-03 ENCOUNTER — Other Ambulatory Visit: Payer: Self-pay | Admitting: Physician Assistant

## 2019-12-03 DIAGNOSIS — M1990 Unspecified osteoarthritis, unspecified site: Secondary | ICD-10-CM

## 2019-12-03 NOTE — Telephone Encounter (Signed)
Copied from [place] [phone]. Topic: Quick Communication - Rx Refill/Question >> Dec 03, 2019  4:23 PM Yvette Rack wrote: Medication: HYDROcodone-acetaminophen (NORCO) 10-325 MG tablet  Has the patient contacted their pharmacy? no  Preferred Pharmacy (with phone number or street name): [facility], [place]. Phone: [phone] Fax: [phone]  Agent: Please be advised that RX refills may take up to 3 business days. We ask that you follow-up with your pharmacy.

## 2019-12-04 MED ORDER — HYDROCODONE-ACETAMINOPHEN 10-325 MG PO TABS: 1.0000 | ORAL_TABLET | Freq: Three times a day (TID) | ORAL | 0 refills | Status: DC | PRN

## 2019-12-04 NOTE — Telephone Encounter (Signed)
Requested medication (s) are due for refill today: yes  Requested medication (s) are on the active medication list: yes  Last refill: 10/07/19  Future visit scheduled: yes  Notes to clinic:  not delegated   Requested Prescriptions  Pending Prescriptions Disp Refills   HYDROcodone-acetaminophen (NORCO) 10-325 MG tablet 60 tablet 0    Sig: Take 1 tablet by mouth every 8 (eight) hours as needed for severe pain.      Not Delegated - Analgesics:  Opioid Agonist Combinations Failed - 12/04/2019  7:21 AM      Failed - This refill cannot be delegated      Failed - Urine Drug Screen completed in last 360 days.      Passed - Valid encounter within last 6 months    Recent Outpatient Visits           2 weeks ago Recurrent major depressive disorder, in partial remission [facility])   [place], [place], [place]   10 months ago Sinus pressure   [facility] [place], Dionne Bucy, [place]   1 year ago Elevated blood pressure reading   [place], [place]   2 years ago Chronic narcotic use   [place], [place]   2 years ago Neuritis or radiculitis due to rupture of lumbar intervertebral disc   [facility], [place], [place]

## 2020-01-03 ENCOUNTER — Other Ambulatory Visit: Payer: Self-pay | Admitting: Physician Assistant

## 2020-01-03 DIAGNOSIS — M1990 Unspecified osteoarthritis, unspecified site: Secondary | ICD-10-CM

## 2020-01-03 NOTE — Telephone Encounter (Signed)
Requested medication (s) are due for refill today: yes  Requested medication (s) are on the active medication list: yes  Last refill:  12/04/2019  Future visit scheduled: yes  Notes to clinic:  this refill cannot be delegated    Requested Prescriptions  Pending Prescriptions Disp Refills   HYDROcodone-acetaminophen (NORCO) 10-325 MG tablet 60 tablet 0    Sig: Take 1 tablet by mouth every 8 (eight) hours as needed for severe pain.      Not Delegated - Analgesics:  Opioid Agonist Combinations Failed - 01/03/2020 12:11 PM      Failed - This refill cannot be delegated      Failed - Urine Drug Screen completed in last 360 days      Passed - Valid encounter within last 6 months    Recent Outpatient Visits           1 month ago Recurrent major depressive disorder, in partial remission [facility])   [place], [place], [place]   11 months ago Sinus pressure   [facility] [place], Dionne Bucy, [place]   1 year ago Elevated blood pressure reading   [facility], Clearnce Sorrel, [place]   2 years ago Chronic narcotic use   [place], [place]   2 years ago Neuritis or radiculitis due to rupture of lumbar intervertebral disc   [facility], [place], [place]

## 2020-01-03 NOTE — Telephone Encounter (Signed)
Medication: HYDROcodone-acetaminophen (NORCO) 10-325 MG tablet [[id number]]   Has the patient contacted their pharmacy? YES (Agent: If no, request that the patient contact the pharmacy for the refill.) (Agent: If yes, when and what did the pharmacy advise?)  Preferred Pharmacy (with phone number or street name): [facility] [place], [place] [address] Phone: [phone] Fax: [phone] Hours: Not open 24 hours    Agent: Please be advised that RX refills may take up to 3 business days. We ask that you follow-up with your pharmacy.

## 2020-01-06 MED ORDER — HYDROCODONE-ACETAMINOPHEN 10-325 MG PO TABS: 1.0000 | ORAL_TABLET | Freq: Three times a day (TID) | ORAL | 0 refills | Status: DC | PRN

## 2020-01-13 ENCOUNTER — Other Ambulatory Visit: Payer: Self-pay | Admitting: Physician Assistant

## 2020-01-13 DIAGNOSIS — F411 Generalized anxiety disorder: Secondary | ICD-10-CM

## 2020-01-13 NOTE — Telephone Encounter (Signed)
Requested medication (s) are due for refill today -yes  Requested medication (s) are on the active medication list -yes  Future visit scheduled -yes  Last refill: 12/12/19  Notes to clinic: Request for non delegated Rx  Requested Prescriptions  Pending Prescriptions Disp Refills   ALPRAZolam (XANAX) 1 MG tablet [Pharmacy Med Name: ALPRAZOLAM 1 MG TAB] 60 tablet     Sig: TAKE 1/2 TO 1 TABLET BY MOUTH TWICE DAILY AS NEEDED FOR ANXIETY      Not Delegated - Psychiatry:  Anxiolytics/Hypnotics Failed - 01/13/2020  1:39 PM      Failed - This refill cannot be delegated      Failed - Urine Drug Screen completed in last 360 days      Passed - Valid encounter within last 6 months    Recent Outpatient Visits           1 month ago Recurrent major depressive disorder, in partial remission [facility])   [facility] [place], [place], [place]   12 months ago Sinus pressure   [facility] [place], Dionne Bucy, [place]   1 year ago Elevated blood pressure reading   [facility], Clearnce Sorrel, [place]   2 years ago Chronic narcotic use   [place], [place], [place]   2 years ago Neuritis or radiculitis due to rupture of lumbar intervertebral disc   [facility] [place], Clearnce Sorrel, [place]                  Requested Prescriptions  Pending Prescriptions Disp Refills   ALPRAZolam (XANAX) 1 MG tablet [Pharmacy Med Name: ALPRAZOLAM 1 MG TAB] 60 tablet     Sig: TAKE 1/2 TO 1 TABLET BY MOUTH TWICE DAILY AS NEEDED FOR ANXIETY      Not Delegated - Psychiatry:  Anxiolytics/Hypnotics Failed - 01/13/2020  1:39 PM      Failed - This refill cannot be delegated      Failed - Urine Drug Screen completed in last 360 days      Passed - Valid encounter within last 6 months    Recent Outpatient Visits           1 month ago Recurrent major depressive disorder, in partial remission [facility])   [place], [place]   12 months ago Sinus pressure   [facility] [place], Dionne Bucy, [place]   1 year ago Elevated blood pressure reading   [place], Clearnce Sorrel, [place]   2 years ago Chronic narcotic use   [place], [place], [place]   2 years ago Neuritis or radiculitis due to rupture of lumbar intervertebral disc   [facility] [place], [place], [place]

## 2020-01-29 DIAGNOSIS — Z23 Encounter for immunization: Secondary | ICD-10-CM | POA: Diagnosis not present

## 2020-02-10 ENCOUNTER — Other Ambulatory Visit: Payer: Self-pay | Admitting: Physician Assistant

## 2020-02-10 DIAGNOSIS — Z1231 Encounter for screening mammogram for malignant neoplasm of breast: Secondary | ICD-10-CM

## 2020-03-26 ENCOUNTER — Encounter: Payer: Self-pay | Admitting: Physician Assistant

## 2020-03-26 DIAGNOSIS — H25013 Cortical age-related cataract, bilateral: Secondary | ICD-10-CM | POA: Diagnosis not present

## 2020-04-01 ENCOUNTER — Encounter: Payer: Medicare Other | Admitting: Physician Assistant

## 2020-04-15 ENCOUNTER — Encounter: Payer: Medicare Other | Admitting: Physician Assistant

## 2020-04-22 ENCOUNTER — Ambulatory Visit
Admission: RE | Admit: 2020-04-22 | Discharge: 2020-04-22 | Disposition: A | Discharge status: Home / Self Care | Payer: Medicare Other | Source: Ambulatory Visit | Attending: Physician Assistant | Admitting: Physician Assistant

## 2020-04-22 ENCOUNTER — Other Ambulatory Visit: Payer: Self-pay

## 2020-04-22 DIAGNOSIS — Z1231 Encounter for screening mammogram for malignant neoplasm of breast: Secondary | ICD-10-CM | POA: Insufficient documentation

## 2020-04-27 ENCOUNTER — Telehealth: Payer: Self-pay

## 2020-04-27 NOTE — Telephone Encounter (Signed)
Tried calling; pt's voicemail is not set up.  PEC please advise pt of mammogram results if she calls back.   Thanks,   -Mickel Baas

## 2020-04-27 NOTE — Telephone Encounter (Signed)
-----   Message from Mar Daring, [place] sent at 04/24/2020  4:10 PM EST ----- Normal mammogram. Repeat screening in one year.

## 2020-04-28 NOTE — Telephone Encounter (Signed)
Pt advised.   Thanks,   -Keonta Alsip  

## 2020-05-06 ENCOUNTER — Ambulatory Visit (INDEPENDENT_AMBULATORY_CARE_PROVIDER_SITE_OTHER): Payer: Medicare Other | Admitting: Physician Assistant

## 2020-05-06 ENCOUNTER — Encounter: Payer: Self-pay | Admitting: Physician Assistant

## 2020-05-06 ENCOUNTER — Other Ambulatory Visit: Payer: Self-pay

## 2020-05-06 VITALS — BP 175/83 | HR 93 | Temp 98.2°F | Ht 65.0 in | Wt 142.0 lb

## 2020-05-06 DIAGNOSIS — E7800 Pure hypercholesterolemia, unspecified: Secondary | ICD-10-CM | POA: Diagnosis not present

## 2020-05-06 DIAGNOSIS — M1990 Unspecified osteoarthritis, unspecified site: Secondary | ICD-10-CM | POA: Diagnosis not present

## 2020-05-06 DIAGNOSIS — Z0000 Encounter for general adult medical examination without abnormal findings: Secondary | ICD-10-CM

## 2020-05-06 DIAGNOSIS — M5116 Intervertebral disc disorders with radiculopathy, lumbar region: Secondary | ICD-10-CM | POA: Diagnosis not present

## 2020-05-06 DIAGNOSIS — R030 Elevated blood-pressure reading, without diagnosis of hypertension: Secondary | ICD-10-CM

## 2020-05-06 MED ORDER — HYDROCODONE-ACETAMINOPHEN 10-325 MG PO TABS: 1.0000 | ORAL_TABLET | Freq: Three times a day (TID) | ORAL | 0 refills | Status: DC | PRN

## 2020-05-06 MED ORDER — KETOROLAC TROMETHAMINE 60 MG/2ML IM SOLN: 60.0000 mg | Freq: Once | INTRAMUSCULAR | Status: AC

## 2020-05-06 MED ORDER — GABAPENTIN 300 MG PO CAPS: 300.0000 mg | ORAL_CAPSULE | Freq: Three times a day (TID) | ORAL | 3 refills | Status: DC

## 2020-05-06 NOTE — Patient Instructions (Signed)

## 2020-05-06 NOTE — Progress Notes (Signed)
Complete physical exam   Patient: Patricia Decker   DOB: 05-29-1955   65 y.o. Female  MRN: [id number] Visit Date: 05/06/2020  Today's healthcare provider: Mar Daring, PA-C   Chief Complaint  Patient presents with  . Annual Exam   Subjective    Patricia Decker is a 66 y.o. female who presents today for a complete physical exam.  She reports consuming a general diet.  She generally feels fairly well. She reports sleeping well. She does have additional problems to discuss today.  HPI  Back pain. Worsening. Has been followed by Dr. Sabra Heck for left hip pain, but he has referred her to the back specialist in his office for worsening symptoms. She has appt on 05/15/20. She is on chronic hydrocodone-acetaminophen but only takes every other day. Last refill was 01/2020.  Past Medical History:  Diagnosis Date  . Anemia   . Anxiety   . Arthritis   . Asthma   . Cataract    removed  . Depression   . Wears dentures    Full upper, partial lower   Past Surgical History:  Procedure Laterality Date  . ABDOMINAL HYSTERECTOMY    . APPENDECTOMY    . CATARACT EXTRACTION, BILATERAL    . CHOLECYSTECTOMY    . COLONOSCOPY WITH PROPOFOL N/A 06/30/2016   Procedure: COLONOSCOPY WITH PROPOFOL;  Surgeon: Jonathon Bellows, MD;  Location: [facility] ENDOSCOPY;  Service: Endoscopy;  Laterality: N/A;  . EAR CYST EXCISION Left 07/31/2019   Procedure: EXCISION EAR LESION;  Surgeon: Carloyn Manner, MD;  Location: [facility];  Service: ENT;  Laterality: Left;  Latex   Social History   Socioeconomic History  . Marital status: Widowed    Spouse name: Not on file  . Number of children: 1  . Years of education: Not on file  . Highest education level: Some college, no degree  Occupational History  . Occupation: disability  Tobacco Use  . Smoking status: Never Smoker  . Smokeless tobacco: Never Used  Vaping Use  . Vaping Use: Never used  Substance and Sexual Activity  . Alcohol use: No  .  Drug use: No  . Sexual activity: Not on file  Other Topics Concern  . Not on file  Social History Narrative  . Not on file   Social Determinants of Health   Financial Resource Strain: Low Risk   . Difficulty of Paying Living Expenses: Not hard at all  Food Insecurity: No Food Insecurity  . Worried About Charity fundraiser in the Last Year: Never true  . Ran Out of Food in the Last Year: Never true  Transportation Needs: No Transportation Needs  . Lack of Transportation (Medical): No  . Lack of Transportation (Non-Medical): No  Physical Activity: Inactive  . Days of Exercise per Week: 0 days  . Minutes of Exercise per Session: 0 min  Stress: Stress Concern Present  . Feeling of Stress : To some extent  Social Connections: Socially Isolated  . Frequency of Communication with Friends and Family: Once a week  . Frequency of Social Gatherings with Friends and Family: Once a week  . Attends Religious Services: More than 4 times per year  . Active Member of Clubs or Organizations: No  . Attends Archivist Meetings: Never  . Marital Status: Widowed  Intimate Partner Violence: Not At Risk  . Fear of Current or Ex-Partner: No  . Emotionally Abused: No  . Physically Abused: No  .  Sexually Abused: No   Family Status  Relation Name Status  . Sister  Deceased at age 55's  . Mother  Deceased at age 11       bone cancer  . Father  Deceased at age 42       kidney failure  . Brother  Alive  . Son  Deceased at age 58  . Sister  Alive  . Sister  Alive  . Sister  Alive  . Sister  Alive  . Brother  Alive  . Brother  Alive  . Brother  Alive  . Sister  Alive   Family History  Problem Relation Age of Onset  . Alzheimer's disease Sister   . Breast cancer Sister 31   Allergies  Allergen Reactions  . Penicillins   . Sulfa Antibiotics     Indigestion  . Latex Rash    Gloves irritate hands when worn    Patient Care Team: Mar Daring, PA-C as PCP - General  (Family Medicine) Bernita Raisin, MD as Consulting Physician Earnestine Leys, MD as Consulting Physician (Orthopedic Surgery)   Medications: Outpatient Medications Prior to Visit  Medication Sig  . ALPRAZolam (XANAX) 1 MG tablet TAKE 1/2 TO 1 TABLET BY MOUTH TWICE DAILY AS NEEDED FOR ANXIETY  . ARIPIPRAZOLE PO Take 15 mg by mouth daily.   Marland Kitchen augmented betamethasone dipropionate (DIPROLENE-AF) 0.05 % cream Apply topically as needed.   . diclofenac (VOLTAREN) 75 MG EC tablet Take 75 mg by mouth 2 (two) times daily.  (Patient not taking: Reported on 11/20/2019)  . Ferrous Sulfate (IRON PO) Take by mouth 2 (two) times a week.  Marland Kitchen HYDROcodone-acetaminophen (NORCO) 10-325 MG tablet Take 1 tablet by mouth every 8 (eight) hours as needed for severe pain.  . hydrOXYzine (VISTARIL) 50 MG capsule Take 50 mg by mouth as needed (for itching).   . ondansetron (ZOFRAN) 4 MG tablet Take 1 tablet (4 mg total) by mouth every 8 (eight) hours as needed for up to 10 doses for nausea or vomiting. (Patient not taking: Reported on 11/20/2019)  . venlafaxine XR (EFFEXOR XR) 150 MG 24 hr capsule Take 1 capsule by mouth 2 (two) times daily.    No facility-administered medications prior to visit.    Review of Systems  Constitutional: Negative.   HENT: Negative.   Eyes: Negative.   Respiratory: Negative.   Cardiovascular: Negative.   Gastrointestinal: Negative.   Endocrine: Negative.   Genitourinary: Negative.   Musculoskeletal: Positive for arthralgias and back pain. Negative for gait problem, joint swelling, myalgias, neck pain and neck stiffness.  Skin: Negative.   Allergic/Immunologic: Negative.   Neurological: Positive for numbness. Negative for dizziness, tremors, seizures, syncope, facial asymmetry, speech difficulty, weakness, light-headedness and headaches.  Hematological: Negative.   Psychiatric/Behavioral: Negative for agitation, behavioral problems, confusion, decreased concentration, dysphoric mood,  hallucinations, self-injury, sleep disturbance and suicidal ideas. The patient is nervous/anxious. The patient is not hyperactive.       Objective    BP (!) 175/83 (BP Location: Left Arm, Patient Position: Sitting, Cuff Size: Large)   Pulse 93   Temp 98.2 F (36.8 C) (Oral)   Ht 5\' 5"  (1.651 m)   Wt 142 lb (64.4 kg)   BMI 23.63 kg/m    Physical Exam Vitals reviewed.  Constitutional:      General: She is not in acute distress.    Appearance: Normal appearance. She is well-developed and well-nourished. She is ill-appearing (in pain). She is not diaphoretic.  HENT:  Head: Normocephalic and atraumatic.     Right Ear: Tympanic membrane, ear canal and external ear normal.     Left Ear: Tympanic membrane, ear canal and external ear normal.     Mouth/Throat:     Mouth: Oropharynx is clear and moist.  Eyes:     General: No scleral icterus.       Right eye: No discharge.        Left eye: No discharge.     Extraocular Movements: Extraocular movements intact and EOM normal.     Conjunctiva/sclera: Conjunctivae normal.     Pupils: Pupils are equal, round, and reactive to light.  Neck:     Thyroid: No thyromegaly.     Vascular: No carotid bruit or JVD.     Trachea: No tracheal deviation.  Cardiovascular:     Rate and Rhythm: Normal rate and regular rhythm.     Pulses: Normal pulses and intact distal pulses.     Heart sounds: Normal heart sounds. No murmur heard. No friction rub. No gallop.   Pulmonary:     Effort: Pulmonary effort is normal. No respiratory distress.     Breath sounds: Normal breath sounds. No wheezing or rales.  Chest:     Chest wall: No tenderness.  Musculoskeletal:        General: Tenderness present. No edema.     Cervical back: Normal range of motion and neck supple. No tenderness.     Right lower leg: No edema.     Left lower leg: No edema.     Comments: Patient unable to sit due to pain; walking limited as well  Lymphadenopathy:     Cervical: No  cervical adenopathy.  Skin:    General: Skin is warm and dry.     Capillary Refill: Capillary refill takes less than 2 seconds.     Findings: No rash.  Neurological:     Mental Status: She is alert and oriented to person, place, and time.     Gait: Gait abnormal.  Psychiatric:        Mood and Affect: Mood and affect and mood normal. Affect is tearful (due to pain).        Behavior: Behavior normal.        Thought Content: Thought content normal.        Judgment: Judgment normal.     Last depression screening scores PHQ 2/9 Scores 11/20/2019 07/10/2019 03/21/2019  PHQ - 2 Score 1 1 2   PHQ- 9 Score 2 - 3   Last fall risk screening Fall Risk  07/10/2019  Falls in the past year? 0  Number falls in past yr: 0  Injury with Fall? 0  Follow up -   Last Audit-C alcohol use screening Alcohol Use Disorder Test (AUDIT) 11/20/2019  1. How often do you have a drink containing alcohol? 0  2. How many drinks containing alcohol do you have on a typical day when you are drinking? 0  3. How often do you have six or more drinks on one occasion? 0  AUDIT-C Score 0  Alcohol Brief Interventions/Follow-up -   A score of 3 or more in women, and 4 or more in men indicates increased risk for alcohol abuse, EXCEPT if all of the points are from question 1   No results found for any visits on 05/06/20.  Assessment & Plan    Routine Health Maintenance and Physical Exam  Exercise Activities and Dietary recommendations Goals    . DIET -  INCREASE WATER INTAKE     Recommend to drink at least 6-8 8oz glasses of water per day.        There is no immunization history on file for this patient.  Health Maintenance  Topic Date Due  . COVID-19 Vaccine (1) Never done  . TETANUS/TDAP  Never done  . INFLUENZA VACCINE  Never done  . PAP SMEAR-Modifier  06/10/2020  . MAMMOGRAM  04/22/2022  . COLONOSCOPY (Pts 45-35yrs Insurance coverage will need to be confirmed)  07/01/2026  . Hepatitis C Screening   Completed  . HIV Screening  Completed  . HPV VACCINES  Aged Out    Discussed health benefits of physical activity, and encouraged her to engage in regular exercise appropriate for her age and condition.  1. Medicare annual wellness visit, subsequent Normal exam with exception of pain.   2. Neuritis or radiculitis due to rupture of lumbar intervertebral disc Toradol IM given as below in the office today. Will add gabapentin as below. Continue Hydrocodone-acetaminophen 10-325mg . NCCSR reviewed and normal. Will check labs as below and f/u pending results. Call if worsening.  - CBC w/Diff/Platelet - Comprehensive Metabolic Panel (CMET) - Lipid Panel With LDL/HDL Ratio - ketorolac (TORADOL) injection 60 mg - gabapentin (NEURONTIN) 300 MG capsule; Take 1 capsule (300 mg total) by mouth 3 (three) times daily.  Dispense: 90 capsule; Refill: 3  3. Elevated blood pressure reading Diet controlled. Will check labs as below and f/u pending results. - CBC w/Diff/Platelet - Comprehensive Metabolic Panel (CMET) - Lipid Panel With LDL/HDL Ratio - TSH  4. Hypercholesterolemia Stable. Diet controlled. Will check labs as below and f/u pending results. - CBC w/Diff/Platelet - Comprehensive Metabolic Panel (CMET) - Lipid Panel With LDL/HDL Ratio - TSH  5. Arthritis Stable. Diagnosis pulled for medication refill. Continue current medical treatment plan. - HYDROcodone-acetaminophen (NORCO) 10-325 MG tablet; Take 1 tablet by mouth every 8 (eight) hours as needed for severe pain.  Dispense: 60 tablet; Refill: 0   No follow-ups on file.     Reynolds Bowl, PA-C, have reviewed all documentation for this visit. The documentation on 05/06/20 for the exam, diagnosis, procedures, and orders are all accurate and complete.   [place]ye [place]  [facility] [phone] (phone) [phone] (fax)  [place]

## 2020-05-07 LAB — CBC WITH DIFFERENTIAL/PLATELET
Basophils Absolute: 0.1 10*3/uL (ref 0.0–0.2)
Basos: 1 %
EOS (ABSOLUTE): 0.2 10*3/uL (ref 0.0–0.4)
Eos: 2 %
Hematocrit: 35.9 % (ref 34.0–46.6)
Hemoglobin: 11.5 g/dL (ref 11.1–15.9)
Immature Grans (Abs): 0 10*3/uL (ref 0.0–0.1)
Immature Granulocytes: 0 %
Lymphocytes Absolute: 4 10*3/uL — ABNORMAL HIGH (ref 0.7–3.1)
Lymphs: 53 %
MCH: 26.9 pg (ref 26.6–33.0)
MCHC: 32 g/dL (ref 31.5–35.7)
MCV: 84 fL (ref 79–97)
Monocytes Absolute: 0.4 10*3/uL (ref 0.1–0.9)
Monocytes: 6 %
Neutrophils Absolute: 2.8 10*3/uL (ref 1.4–7.0)
Neutrophils: 38 %
Platelets: 430 10*3/uL (ref 150–450)
RBC: 4.28 x10E6/uL (ref 3.77–5.28)
RDW: 14.7 % (ref 11.7–15.4)
WBC: 7.4 10*3/uL (ref 3.4–10.8)

## 2020-05-07 LAB — COMPREHENSIVE METABOLIC PANEL
ALT: 10 IU/L (ref 0–32)
AST: 21 IU/L (ref 0–40)
Albumin/Globulin Ratio: 1.6 (ref 1.2–2.2)
Albumin: 4.6 g/dL (ref 3.8–4.8)
Alkaline Phosphatase: 74 IU/L (ref 44–121)
BUN/Creatinine Ratio: 16 (ref 12–28)
BUN: 14 mg/dL (ref 8–27)
Bilirubin Total: <0.2 mg/dL (ref 0.0–1.2)
CO2: 20 mmol/L (ref 20–29)
Calcium: 9.7 mg/dL (ref 8.7–10.3)
Chloride: 103 mmol/L (ref 96–106)
Creatinine, Ser: 0.86 mg/dL (ref 0.57–1.00)
Globulin, Total: 2.9 g/dL (ref 1.5–4.5)
Glucose: 98 mg/dL (ref 65–99)
Potassium: 4.5 mmol/L (ref 3.5–5.2)
Sodium: 140 mmol/L (ref 134–144)
Total Protein: 7.5 g/dL (ref 6.0–8.5)
eGFR: 76 mL/min/{1.73_m2} (ref >59)

## 2020-05-07 LAB — LIPID PANEL WITH LDL/HDL RATIO
Cholesterol, Total: 224 mg/dL — ABNORMAL HIGH (ref 100–199)
HDL: 63 mg/dL (ref >39)
LDL Chol Calc (NIH): 146 mg/dL — ABNORMAL HIGH (ref 0–99)
LDL/HDL Ratio: 2.3 ratio (ref 0.0–3.2)
Triglycerides: 85 mg/dL (ref 0–149)
VLDL Cholesterol Cal: 15 mg/dL (ref 5–40)

## 2020-05-07 LAB — TSH: TSH: 0.891 u[IU]/mL (ref 0.450–4.500)

## 2020-05-15 DIAGNOSIS — M5416 Radiculopathy, lumbar region: Secondary | ICD-10-CM | POA: Diagnosis not present

## 2020-05-19 ENCOUNTER — Telehealth: Payer: Self-pay

## 2020-05-19 NOTE — Telephone Encounter (Signed)
-----   Message from Mar Daring, [place] sent at 05/19/2020 12:04 PM EDT ----- Bary Leriche,  Labs are overall looking ok. Cholesterol has increased compared to last year. Limit fatty foods, red meats, processed foods/meats, and sugar in diet.   Grace Bushy, [facility]

## 2020-05-19 NOTE — Telephone Encounter (Signed)
Pt advised.   Thanks,   -Jad Johansson  

## 2020-06-26 DIAGNOSIS — M4186 Other forms of scoliosis, lumbar region: Secondary | ICD-10-CM | POA: Diagnosis not present

## 2020-06-26 DIAGNOSIS — M4696 Unspecified inflammatory spondylopathy, lumbar region: Secondary | ICD-10-CM | POA: Diagnosis not present

## 2020-07-01 ENCOUNTER — Other Ambulatory Visit: Payer: Self-pay | Admitting: Physician Assistant

## 2020-07-01 DIAGNOSIS — M1990 Unspecified osteoarthritis, unspecified site: Secondary | ICD-10-CM

## 2020-07-01 NOTE — Telephone Encounter (Signed)
Requested medication (s) are due for refill today: Yes  Requested medication (s) are on the active medication list: Yes  Last refill:  05/06/20  Future visit scheduled: No  Notes to clinic:  Unable to refill per protocol, cannot delegate.      Requested Prescriptions  Pending Prescriptions Disp Refills   HYDROcodone-acetaminophen (NORCO) 10-325 MG tablet 60 tablet 0    Sig: Take 1 tablet by mouth every 8 (eight) hours as needed for severe pain.      Not Delegated - Analgesics:  Opioid Agonist Combinations Failed - 07/01/2020  3:30 PM      Failed - This refill cannot be delegated      Failed - Urine Drug Screen completed in last 360 days      Failed - Valid encounter within last 6 months    Recent Outpatient Visits           7 months ago Recurrent major depressive disorder, in partial remission [facility])   [facility] [place], [place], [place]   1 year ago Sinus pressure   [facility] [place], Dionne Bucy, [place]   1 year ago Elevated blood pressure reading   [facility], Clearnce [place]rel, [place]   2 years ago Chronic narcotic use   [place], [place], [place]   3 years ago Neuritis or radiculitis due to rupture of lumbar intervertebral disc   [facility], [place], [place]

## 2020-07-01 NOTE — Telephone Encounter (Signed)
Copied from [place] [id number]. Topic: Quick Communication - Rx Refill/Question >> Jul 01, 2020  3:26 PM Leward Quan A wrote: Medication: HYDROcodone-acetaminophen ([place]) 10-325 MG tablet  Has the patient contacted their pharmacy? Yes.   (Agent: If no, request that the patient contact the pharmacy for the refill.) (Agent: If yes, when and what did the pharmacy advise?)  Preferred Pharmacy (with phone number or street name): [facility] [place], [place].  Phone:  [phone] Fax:  [phone]     Agent: Please be advised that RX refills may take up to 3 business days. We ask that you follow-up with your pharmacy.

## 2020-07-02 MED ORDER — HYDROCODONE-ACETAMINOPHEN 10-325 MG PO TABS: 1.0000 | ORAL_TABLET | Freq: Three times a day (TID) | ORAL | 0 refills | Status: DC | PRN

## 2020-07-07 DIAGNOSIS — M4696 Unspecified inflammatory spondylopathy, lumbar region: Secondary | ICD-10-CM | POA: Diagnosis not present

## 2020-07-07 DIAGNOSIS — M48061 Spinal stenosis, lumbar region without neurogenic claudication: Secondary | ICD-10-CM | POA: Diagnosis not present

## 2020-07-07 DIAGNOSIS — M5126 Other intervertebral disc displacement, lumbar region: Secondary | ICD-10-CM | POA: Diagnosis not present

## 2020-07-07 DIAGNOSIS — M47816 Spondylosis without myelopathy or radiculopathy, lumbar region: Secondary | ICD-10-CM | POA: Diagnosis not present

## 2020-07-13 ENCOUNTER — Other Ambulatory Visit: Payer: Self-pay | Admitting: Physician Assistant

## 2020-07-13 DIAGNOSIS — F411 Generalized anxiety disorder: Secondary | ICD-10-CM

## 2020-07-13 NOTE — Telephone Encounter (Signed)
Requested medication (s) are due for refill today: yes  Requested medication (s) are on the active medication list: yes  Last refill:  06/06/2020  This refill cannot be delegated  Requested Prescriptions  Pending Prescriptions Disp Refills   ALPRAZolam (XANAX) 1 MG tablet [Pharmacy Med Name: ALPRAZOLAM 1 MG TAB] 60 tablet     Sig: TAKE 1/2 TO 1 TABLET BY MOUTH TWICE DAILY AS NEEDED FOR ANXIETY      Not Delegated - Psychiatry:  Anxiolytics/Hypnotics Failed - 07/13/2020 11:48 AM      Failed - This refill cannot be delegated      Failed - Urine Drug Screen completed in last 360 days      Failed - Valid encounter within last 6 months    Recent Outpatient Visits           7 months ago Recurrent major depressive disorder, in partial remission [facility])   [facility] [place], [place], [place]   1 year ago Sinus pressure   [facility] [place], Dionne Bucy, MD   1 year ago Elevated blood pressure reading   [place], [place], [place]   3 years ago Chronic narcotic use   [place], [place], [place]   3 years ago Neuritis or radiculitis due to rupture of lumbar intervertebral disc   [facility] [place], [place], [place]

## 2020-07-17 DIAGNOSIS — M5416 Radiculopathy, lumbar region: Secondary | ICD-10-CM | POA: Diagnosis not present

## 2020-08-05 DIAGNOSIS — M5416 Radiculopathy, lumbar region: Secondary | ICD-10-CM | POA: Diagnosis not present

## 2020-08-06 ENCOUNTER — Other Ambulatory Visit: Payer: Self-pay | Admitting: Family Medicine

## 2020-08-06 DIAGNOSIS — M1990 Unspecified osteoarthritis, unspecified site: Secondary | ICD-10-CM

## 2020-08-06 NOTE — Telephone Encounter (Signed)
Requested medication (s) are due for refill today: yes  Requested medication (s) are on the active medication list: yes  Last refill:  07/02/20  Future visit scheduled: yes  Notes to clinic: not delegated    Requested Prescriptions  Pending Prescriptions Disp Refills   HYDROcodone-acetaminophen ([place]) 10-325 MG tablet [Pharmacy Med Name: HYDROCODONE-APAP 10-325 MG TAB] 60 tablet     Sig: TAKE 1 TABLET BY MOUTH EVERY 8 HOURS AS NEEDED SEVERE PAIN      Not Delegated - Analgesics:  Opioid Agonist Combinations Failed - 08/06/2020  8:45 AM      Failed - This refill cannot be delegated      Failed - Urine Drug Screen completed in last 360 days      Failed - Valid encounter within last 6 months    Recent Outpatient Visits           8 months ago Recurrent major depressive disorder, in partial remission [facility])   [facility] [place], [place], [place]   1 year ago Sinus pressure   [facility] [place], Dionne Bucy, MD   1 year ago Elevated blood pressure reading   [place], [place], [place]   3 years ago Chronic narcotic use   [place], [place], [place]   3 years ago Neuritis or radiculitis due to rupture of lumbar intervertebral disc   [facility] [place], Clearnce [place], [place]       Future Appointments             In 3 months Bacigalupo, Dionne Bucy, MD [facility], [place]

## 2020-08-21 DIAGNOSIS — M5416 Radiculopathy, lumbar region: Secondary | ICD-10-CM | POA: Diagnosis not present

## 2020-08-21 DIAGNOSIS — M7062 Trochanteric bursitis, left hip: Secondary | ICD-10-CM | POA: Diagnosis not present

## 2020-08-26 ENCOUNTER — Other Ambulatory Visit: Payer: Self-pay | Admitting: Physician Assistant

## 2020-08-26 ENCOUNTER — Other Ambulatory Visit: Payer: Self-pay | Admitting: Family Medicine

## 2020-08-26 DIAGNOSIS — F411 Generalized anxiety disorder: Secondary | ICD-10-CM

## 2020-08-26 NOTE — Telephone Encounter (Signed)
Medication Refill - Medication: ALPRAZolam (XANAX) 1 MG tablet  Has the patient contacted their pharmacy? No.   Preferred Pharmacy (with phone number or street name): [place], [place].  Phone:  [phone] Fax:  [phone]   Agent: Please be advised that RX refills may take up to 3 business days. We ask that you follow-up with your pharmacy.

## 2020-08-26 NOTE — Telephone Encounter (Signed)
Requested medication (s) are due for refill today: yes  Requested medication (s) are on the active medication list: yes   Last refill:07/13/2020  Future visit scheduled:yes   Notes to clinic: this refill cannot be delegated    Requested Prescriptions  Pending Prescriptions Disp Refills   ALPRAZolam (XANAX) 1 MG tablet [Pharmacy Med Name: ALPRAZOLAM 1 MG TAB] 60 tablet     Sig: TAKE 1/2 TO 1 TABLET BY MOUTH TWICE DAILY AS NEEDED FOR ANXIETY      Not Delegated - Psychiatry:  Anxiolytics/Hypnotics Failed - 08/26/2020  2:53 PM      Failed - This refill cannot be delegated      Failed - Urine Drug Screen completed in last 360 days      Failed - Valid encounter within last 6 months    Recent Outpatient Visits           9 months ago Recurrent major depressive disorder, in partial remission [facility])   [facility] [place], [place], [place]   1 year ago Sinus pressure   [facility] [place], Dionne Bucy, MD   1 year ago Elevated blood pressure reading   [place], [place], [place]   3 years ago Chronic narcotic use   [place], [place], [place]   3 years ago Neuritis or radiculitis due to rupture of lumbar intervertebral disc   [facility] [place], Cle[place]ce [place], [place]       Future Appointments             In 2 months Bacigalupo, Dionne Bucy, MD [facility], [place]

## 2020-08-26 NOTE — Telephone Encounter (Signed)
Duplicate request!! Awaiting approval

## 2020-09-09 ENCOUNTER — Other Ambulatory Visit: Payer: Self-pay | Admitting: Family Medicine

## 2020-09-09 DIAGNOSIS — M1990 Unspecified osteoarthritis, unspecified site: Secondary | ICD-10-CM

## 2020-09-28 ENCOUNTER — Other Ambulatory Visit: Payer: Self-pay | Admitting: Family Medicine

## 2020-09-28 DIAGNOSIS — M1990 Unspecified osteoarthritis, unspecified site: Secondary | ICD-10-CM

## 2020-09-28 NOTE — Telephone Encounter (Signed)
Requested medications are due for refill today.  yes  Requested medications are on the active medications list.  yes  Last refill. 08/07/2020  Future visit scheduled.   yes  Notes to clinic.  Medication not delegated. Pt still assigned to [facility].

## 2020-09-29 DIAGNOSIS — M79605 Pain in left leg: Secondary | ICD-10-CM | POA: Diagnosis not present

## 2020-09-29 DIAGNOSIS — M545 Low back pain, unspecified: Secondary | ICD-10-CM | POA: Diagnosis not present

## 2020-09-29 DIAGNOSIS — R200 Anesthesia of skin: Secondary | ICD-10-CM | POA: Diagnosis not present

## 2020-09-29 DIAGNOSIS — G8929 Other chronic pain: Secondary | ICD-10-CM | POA: Diagnosis not present

## 2020-09-29 DIAGNOSIS — R202 Paresthesia of skin: Secondary | ICD-10-CM | POA: Diagnosis not present

## 2020-09-29 NOTE — Telephone Encounter (Signed)
Patient called in and was informed stated that she is unable to come in any sooner because she is having issues walking and working on getting the help she need with that right now also does not have reliable transportation. Please advise Ph#  [phone]

## 2020-09-29 NOTE — Telephone Encounter (Signed)
Can offer a virtual visit instead with any provider

## 2020-09-29 NOTE — Telephone Encounter (Signed)
Needs a sooner OV.  Has not been seen in >10 months for a controlled substance.  Can see anyone.

## 2020-09-30 NOTE — Telephone Encounter (Signed)
Attempted to reach patient there is no voice answering service, if patient returns call please schedule her a virtual visit with available NP or PA there is also an opening if patient is interested with Dr. Caryn Section on 10/06/20 at [place]

## 2020-10-01 NOTE — Telephone Encounter (Signed)
Attempted to call patient to schedule appointment- no answer. Message: "Call can not be completed at this time- please try again later."

## 2020-10-02 NOTE — Telephone Encounter (Signed)
  Attempted to call patient to schedule appointment- no answer. Message: "Call can not be completed at this time- please try again later."

## 2020-10-03 ENCOUNTER — Other Ambulatory Visit: Payer: Self-pay | Admitting: Family Medicine

## 2020-10-03 DIAGNOSIS — F411 Generalized anxiety disorder: Secondary | ICD-10-CM

## 2020-10-03 NOTE — Telephone Encounter (Signed)
Requested medication (s) are due for refill today: no  Requested medication (s) are on the active medication list: yes  Last refill:  08/28/20 #60 1 RF  Future visit scheduled: yes  Notes to clinic:  med not delegated to NT to RF   Requested Prescriptions  Pending Prescriptions Disp Refills   ALPRAZolam (XANAX) 1 MG tablet [Pharmacy Med Name: ALPRAZOLAM 1 MG TAB] 60 tablet     Sig: TAKE 1/2 TO 1 TABLET BY MOUTH TWICE DAILY AS NEEDED FOR ANXIETY      Not Delegated - Psychiatry:  Anxiolytics/Hypnotics Failed - 10/03/2020  8:57 AM      Failed - This refill cannot be delegated      Failed - Urine Drug Screen completed in last 360 days      Failed - Valid encounter within last 6 months    Recent Outpatient Visits           10 months ago Recurrent major depressive disorder, in partial remission [facility])   [facility] [place], [place], [place]   1 year ago Sinus pressure   [facility] [place], Dionne Bucy, MD   1 year ago Elevated blood pressure reading   [place], [place], [place]   3 years ago Chronic narcotic use   [place], [place], [place]   3 years ago Neuritis or radiculitis due to rupture of lumbar intervertebral disc   [facility] [place], [place], [place]       Future Appointments             In 1 month Bacigalupo, Dionne Bucy, MD [facility], [place]

## 2020-10-05 NOTE — Telephone Encounter (Signed)
Patient scheduled a virtual appointment with Dr. Caryn Section for 10/06/2020 for a HYDROcodone-acetaminophen (NORCO) 10-325 MG tablet refill due to patient experiencing neck and back pain.

## 2020-10-05 NOTE — Addendum Note (Signed)
Addended by: Ashley Royalty E on: 10/05/2020 03:59 PM   Modules accepted: Orders

## 2020-10-06 ENCOUNTER — Ambulatory Visit (INDEPENDENT_AMBULATORY_CARE_PROVIDER_SITE_OTHER): Payer: Medicare Other | Admitting: Family Medicine

## 2020-10-06 DIAGNOSIS — M5116 Intervertebral disc disorders with radiculopathy, lumbar region: Secondary | ICD-10-CM

## 2020-10-06 DIAGNOSIS — F410 Panic disorder [episodic paroxysmal anxiety] without agoraphobia: Secondary | ICD-10-CM | POA: Diagnosis not present

## 2020-10-06 DIAGNOSIS — F419 Anxiety disorder, unspecified: Secondary | ICD-10-CM | POA: Diagnosis not present

## 2020-10-06 DIAGNOSIS — M1990 Unspecified osteoarthritis, unspecified site: Secondary | ICD-10-CM | POA: Diagnosis not present

## 2020-10-06 MED ORDER — HYDROCODONE-ACETAMINOPHEN 10-325 MG PO TABS: 1.0000 | ORAL_TABLET | Freq: Three times a day (TID) | ORAL | 0 refills | Status: DC | PRN

## 2020-10-06 NOTE — Progress Notes (Signed)
MyChart Video Visit    Virtual Visit via Video Note   This visit type was conducted due to national recommendations for restrictions regarding the COVID-19 Pandemic (e.g. social distancing) in an effort to limit this patient's exposure and mitigate transmission in our community. This patient is at least at moderate risk for complications without adequate follow up. This format is felt to be most appropriate for this patient at this time. Physical exam was limited by quality of the video and audio technology used for the visit.   Patient location: home Provider location: [facility]  I discussed the limitations of evaluation and management by telemedicine and the availability of in person appointments. The patient expressed understanding and agreed to proceed.  Patient: Patricia Decker   DOB: 1955/06/02   65 y.o. Female  MRN: [id number] Visit Date: 10/06/2020  Today's healthcare provider: Lelon Huh, MD   No chief complaint on file.  Subjective    Back Pain This is a chronic problem. The problem is unchanged. The pain is present in the lumbar spine. Radiates to: Left leg. The symptoms are aggravated by sitting, standing and position. Associated symptoms include leg pain, numbness, tingling and weakness. Pertinent negatives include no abdominal pain, bladder incontinence, bowel incontinence or dysuria. She has tried analgesics for the symptoms.   Has been followed for this for many years by Dr. Sabra Heck and recently sent to neurology due to radiculopathy, now scheduled for NCS.   She is also due for follow up of anxiety. Previously prescribed venlafaxine by therapist after losing her son, but stopped venlafaxine several months ago. Has also been taking prn alprazolam for anxiety and panic attacks for several yearly which she may take a few times a day, but many days does not have to take any at all.   Medications: Outpatient Medications Prior to Visit  Medication Sig   ALPRAZolam (XANAX) 1  MG tablet TAKE 1/2 TO 1 TABLET BY MOUTH TWICE DAILY AS NEEDED FOR ANXIETY   ARIPIPRAZOLE PO Take 15 mg by mouth daily.    augmented betamethasone dipropionate (DIPROLENE-AF) 0.05 % cream Apply topically as needed.    diclofenac (VOLTAREN) 75 MG EC tablet Take 75 mg by mouth 2 (two) times daily.  (Patient not taking: Reported on 11/20/2019)   Ferrous Sulfate (IRON PO) Take by mouth 2 (two) times a week.   gabapentin (NEURONTIN) 300 MG capsule Take 1 capsule (300 mg total) by mouth 3 (three) times daily.   HYDROcodone-acetaminophen (NORCO) 10-325 MG tablet TAKE 1 TABLET BY MOUTH EVERY 8 HOURS AS NEEDED SEVERE PAIN   hydrOXYzine (VISTARIL) 50 MG capsule Take 50 mg by mouth as needed (for itching).    ondansetron (ZOFRAN) 4 MG tablet Take 1 tablet (4 mg total) by mouth every 8 (eight) hours as needed for up to 10 doses for nausea or vomiting. (Patient not taking: Reported on 11/20/2019)   venlafaxine XR (EFFEXOR XR) 150 MG 24 hr capsule Take 1 capsule by mouth 2 (two) times daily.    No facility-administered medications prior to visit.    Review of Systems  Constitutional: Negative.   Respiratory: Negative.    Cardiovascular: Negative.   Gastrointestinal: Negative.  Negative for abdominal pain and bowel incontinence.  Genitourinary:  Negative for bladder incontinence and dysuria.  Musculoskeletal:  Positive for back pain and myalgias. Negative for arthralgias, gait problem, joint swelling, neck pain and neck stiffness.  Neurological:  Positive for tingling, weakness and numbness.     Objective  There were no vitals taken for this visit.   Physical Exam   Awake, alert, oriented x 3. In no apparent distress    Assessment & Plan     1. Neuritis or radiculitis due to rupture of lumbar intervertebral disc Progressive over several years, followed by orthopedic and neurology, does well with prn. hydrocodone/apap which she is now out of. refill- HYDROcodone-acetaminophen (NORCO) 10-325 MG  tablet; Take 1 tablet by mouth every 8 (eight) hours as needed for moderate pain.  Dispense: 60 tablet; Refill: 0  2. Arthritis  - HYDROcodone-acetaminophen (NORCO) 10-325 MG tablet; Take 1 tablet by mouth every 8 (eight) hours as needed for moderate pain.  Dispense: 60 tablet; Refill: 0  3. Anxiety Long standing previously on venlafaxine which has now been off of for several months. Not requiring any more alprazolam than she was taking when on venlafaxine. Alprazolam refill was sent to pharmacy yesterday.   4. Panic attacks Does well with prn alprazolam.         I discussed the assessment and treatment plan with the patient. The patient was provided an opportunity to ask questions and all were answered. The patient agreed with the plan and demonstrated an understanding of the instructions.   The patient was advised to call back or seek an in-person evaluation if the symptoms worsen or if the condition fails to improve as anticipated.  I provided 10 minutes of non-face-to-face time during this encounter.  The entirety of the information documented in the History of Present Illness, Review of Systems and Physical Exam were personally obtained by me. Portions of this information were initially documented by the CMA and reviewed by me for thoroughness and accuracy.    Lelon Huh, MD [facility] [phone] (phone) [phone] (fax)  [place]

## 2020-11-04 DIAGNOSIS — M79652 Pain in left thigh: Secondary | ICD-10-CM | POA: Diagnosis not present

## 2020-11-07 ENCOUNTER — Other Ambulatory Visit: Payer: Self-pay | Admitting: Family Medicine

## 2020-11-07 DIAGNOSIS — F411 Generalized anxiety disorder: Secondary | ICD-10-CM

## 2020-11-07 NOTE — Telephone Encounter (Signed)
Requested medication (s) are due for refill today: yes  Requested medication (s) are on the active medication list: yes  Last refill:  10/05/20  Future visit scheduled: no  Notes to clinic:  med not delegated to NT to RF   Requested Prescriptions  Pending Prescriptions Disp Refills   ALPRAZolam (XANAX) 1 MG tablet [Pharmacy Med Name: ALPRAZOLAM 1 MG TAB] 60 tablet     Sig: TAKE 1/2 TO 1 TABLET BY MOUTH TWICE DAILY AS NEEDED FOR ANXIETY     Not Delegated - Psychiatry:  Anxiolytics/Hypnotics Failed - 11/07/2020  2:31 PM      Failed - This refill cannot be delegated      Failed - Urine Drug Screen completed in last 360 days      Passed - Valid encounter within last 6 months    Recent Outpatient Visits           1 month ago Neuritis or radiculitis due to rupture of lumbar intervertebral disc   [facility] Birdie Sons, MD   11 months ago Recurrent major depressive disorder, in partial remission [facility])   [place], [place], [place]   1 year ago Sinus pressure   [facility] [place], Dionne Bucy, MD   1 year ago Elevated blood pressure reading   [place], Clearnce Sorrel, [place]   3 years ago Chronic narcotic use   [facility] [place], [place], [place]

## 2020-11-10 ENCOUNTER — Ambulatory Visit: Payer: Medicare Other | Admitting: Family Medicine

## 2020-11-19 DIAGNOSIS — R202 Paresthesia of skin: Secondary | ICD-10-CM | POA: Diagnosis not present

## 2020-11-19 DIAGNOSIS — M79605 Pain in left leg: Secondary | ICD-10-CM | POA: Diagnosis not present

## 2020-11-19 DIAGNOSIS — R200 Anesthesia of skin: Secondary | ICD-10-CM | POA: Diagnosis not present

## 2020-11-19 DIAGNOSIS — G8929 Other chronic pain: Secondary | ICD-10-CM | POA: Diagnosis not present

## 2020-11-19 DIAGNOSIS — M545 Low back pain, unspecified: Secondary | ICD-10-CM | POA: Diagnosis not present

## 2020-12-16 DIAGNOSIS — R200 Anesthesia of skin: Secondary | ICD-10-CM | POA: Diagnosis not present

## 2020-12-16 DIAGNOSIS — M79605 Pain in left leg: Secondary | ICD-10-CM | POA: Diagnosis not present

## 2020-12-16 DIAGNOSIS — G8929 Other chronic pain: Secondary | ICD-10-CM | POA: Diagnosis not present

## 2020-12-16 DIAGNOSIS — R202 Paresthesia of skin: Secondary | ICD-10-CM | POA: Diagnosis not present

## 2020-12-16 DIAGNOSIS — R29898 Other symptoms and signs involving the musculoskeletal system: Secondary | ICD-10-CM | POA: Diagnosis not present

## 2020-12-16 DIAGNOSIS — M5442 Lumbago with sciatica, left side: Secondary | ICD-10-CM | POA: Diagnosis not present

## 2021-01-04 DIAGNOSIS — M79605 Pain in left leg: Secondary | ICD-10-CM | POA: Diagnosis not present

## 2021-01-04 DIAGNOSIS — M5442 Lumbago with sciatica, left side: Secondary | ICD-10-CM | POA: Diagnosis not present

## 2021-01-04 DIAGNOSIS — R200 Anesthesia of skin: Secondary | ICD-10-CM | POA: Diagnosis not present

## 2021-01-04 DIAGNOSIS — R202 Paresthesia of skin: Secondary | ICD-10-CM | POA: Diagnosis not present

## 2021-01-18 ENCOUNTER — Ambulatory Visit (INDEPENDENT_AMBULATORY_CARE_PROVIDER_SITE_OTHER): Payer: Medicare Other | Admitting: Family Medicine

## 2021-01-18 ENCOUNTER — Encounter: Payer: Self-pay | Admitting: Family Medicine

## 2021-01-18 ENCOUNTER — Other Ambulatory Visit: Payer: Self-pay

## 2021-01-18 VITALS — BP 133/68 | HR 59 | Resp 16 | Wt 140.1 lb

## 2021-01-18 DIAGNOSIS — M79605 Pain in left leg: Secondary | ICD-10-CM | POA: Insufficient documentation

## 2021-01-18 DIAGNOSIS — R1032 Left lower quadrant pain: Secondary | ICD-10-CM | POA: Insufficient documentation

## 2021-01-18 DIAGNOSIS — I739 Peripheral vascular disease, unspecified: Secondary | ICD-10-CM | POA: Insufficient documentation

## 2021-01-18 NOTE — Assessment & Plan Note (Signed)
Denies change in bowel habits Denies break through bleeding No palpable mass

## 2021-01-18 NOTE — Assessment & Plan Note (Signed)
Has tried multitude of medications Encouraged that relief may not be an option but functionality and baseline level of pain to ensure daily ADLs and limit falls

## 2021-01-18 NOTE — Assessment & Plan Note (Signed)
Appears to have PAD in LLE; recommend referral to vascular sx

## 2021-01-18 NOTE — Progress Notes (Signed)
Established patient visit   Patient: Patricia Decker   DOB: [birth year]   65 y.o. Female  MRN: [id number] Visit Date: 01/18/2021  Today's healthcare provider: Gwyneth Sprout, FNP   Chief Complaint  Patient presents with   Flank Pain   Subjective    Flank Pain This is a new problem. The current episode started in the past 7 days. The problem has been waxing and waning since onset. Quality: described as tender. Radiates to: lower back and left side of pelvis. The symptoms are aggravated by stress, standing and position. Associated symptoms include abdominal pain (LUQ), leg pain, numbness and paresthesias. Pertinent negatives include no bladder incontinence, bowel incontinence, chest pain, dysuria, fever, headaches, paresis, pelvic pain, perianal numbness, tingling, weakness or weight loss. She has tried nothing for the symptoms.       Medications: Outpatient Medications Prior to Visit  Medication Sig   ALPRAZolam (XANAX) 1 MG tablet TAKE 1/2 TO 1 TABLET BY MOUTH TWICE DAILY AS NEEDED FOR ANXIETY   ARIPIPRAZOLE PO Take 15 mg by mouth daily.    augmented betamethasone dipropionate (DIPROLENE-AF) 0.05 % cream Apply topically as needed.    diclofenac (VOLTAREN) 75 MG EC tablet Take 75 mg by mouth 2 (two) times daily.   Ferrous Sulfate (IRON PO) Take by mouth 2 (two) times a week.   gabapentin (NEURONTIN) 300 MG capsule Take 1 capsule (300 mg total) by mouth 3 (three) times daily.   HYDROcodone-acetaminophen (NORCO) 10-325 MG tablet Take 1 tablet by mouth every 8 (eight) hours as needed for moderate pain.   hydrOXYzine (VISTARIL) 50 MG capsule Take 50 mg by mouth as needed (for itching).    ondansetron (ZOFRAN) 4 MG tablet Take 1 tablet (4 mg total) by mouth every 8 (eight) hours as needed for up to 10 doses for nausea or vomiting.   No facility-administered medications prior to visit.    Review of Systems  Constitutional:  Negative for fever and weight loss.  Cardiovascular:   Negative for chest pain.  Gastrointestinal:  Positive for abdominal pain (LUQ). Negative for bowel incontinence.  Genitourinary:  Positive for flank pain. Negative for bladder incontinence, dysuria and pelvic pain.  Neurological:  Positive for numbness and paresthesias. Negative for tingling, weakness and headaches.      Objective    BP 133/68   Pulse (!) 59   Resp 16   Wt 140 lb 1.6 oz (63.5 kg)   SpO2 100%   BMI 23.31 kg/m    Physical Exam Vitals and nursing note reviewed.  Constitutional:      General: She is not in acute distress.    Appearance: Normal appearance. She is normal weight. She is not ill-appearing, toxic-appearing or diaphoretic.  HENT:     Head: Normocephalic and atraumatic.  Cardiovascular:     Rate and Rhythm: Normal rate and regular rhythm.     Pulses: Normal pulses.     Heart sounds: Normal heart sounds. No murmur heard.   No friction rub. No gallop.  Pulmonary:     Effort: Pulmonary effort is normal. No respiratory distress.     Breath sounds: Normal breath sounds. No stridor. No wheezing, rhonchi or rales.  Chest:     Chest wall: No tenderness.  Abdominal:     General: Bowel sounds are normal.     Palpations: Abdomen is soft.     Tenderness: There is abdominal tenderness. There is guarding.    Musculoskeletal:  General: Tenderness present. No swelling, deformity or signs of injury. Normal range of motion.     Right lower leg: No edema.     Left lower leg: No edema.       Legs:     Comments: Weakness noted 4/5, limited gait in LLE  Skin:    General: Skin is warm and dry.     Capillary Refill: Capillary refill takes less than 2 seconds.     Coloration: Skin is not jaundiced or pale.     Findings: No bruising, erythema, lesion or rash.  Neurological:     General: No focal deficit present.     Mental Status: She is alert and oriented to person, place, and time. Mental status is at baseline.     Cranial Nerves: No cranial nerve deficit.      Sensory: No sensory deficit.     Motor: No weakness.     Coordination: Coordination normal.  Psychiatric:        Mood and Affect: Mood normal.        Behavior: Behavior normal.        Thought Content: Thought content normal.        Judgment: Judgment normal.      No results found for any visits on 01/18/21.  Assessment & Plan     Problem List Items Addressed This Visit       Cardiovascular and Mediastinum   PAD (peripheral artery disease) ([place]) - Primary    Appears to have PAD in LLE; recommend referral to vascular sx      Relevant Orders   Ambulatory referral to Vascular Surgery     Other   Left lower quadrant abdominal pain    Denies change in bowel habits Denies break through bleeding No palpable mass      Relevant Orders   Ambulatory referral to Gastroenterology   Leg pain, diffuse, left    Has tried multitude of medications Encouraged that relief may not be an option but functionality and baseline level of pain to ensure daily ADLs and limit falls        Return if symptoms worsen or fail to improve.      Vonna Kotyk, FNP, have reviewed all documentation for this visit. The documentation on 01/18/21 for the exam, diagnosis, procedures, and orders are all accurate and complete.    Gwyneth Sprout, [place] [phone] (phone) [phone] (fax)  [place]

## 2021-02-08 ENCOUNTER — Other Ambulatory Visit: Payer: Self-pay | Admitting: Family Medicine

## 2021-02-08 DIAGNOSIS — R202 Paresthesia of skin: Secondary | ICD-10-CM | POA: Diagnosis not present

## 2021-02-08 DIAGNOSIS — M79605 Pain in left leg: Secondary | ICD-10-CM | POA: Diagnosis not present

## 2021-02-08 DIAGNOSIS — R200 Anesthesia of skin: Secondary | ICD-10-CM | POA: Diagnosis not present

## 2021-02-08 DIAGNOSIS — M5442 Lumbago with sciatica, left side: Secondary | ICD-10-CM | POA: Diagnosis not present

## 2021-02-08 DIAGNOSIS — M1990 Unspecified osteoarthritis, unspecified site: Secondary | ICD-10-CM

## 2021-02-08 DIAGNOSIS — M79652 Pain in left thigh: Secondary | ICD-10-CM | POA: Diagnosis not present

## 2021-02-08 DIAGNOSIS — M5116 Intervertebral disc disorders with radiculopathy, lumbar region: Secondary | ICD-10-CM

## 2021-02-08 DIAGNOSIS — R29898 Other symptoms and signs involving the musculoskeletal system: Secondary | ICD-10-CM | POA: Diagnosis not present

## 2021-02-08 NOTE — Telephone Encounter (Signed)
Medication Refill - Medication: HYDROcodone-acetaminophen [facility])   Has the patient contacted their pharmacy? No. (Agent: If no, request that the patient contact the pharmacy for the refill. If patient does not wish to contact the pharmacy document the reason why and proceed with request.) It's an controlled substance and Patient states she always calls the office.  Preferred Pharmacy (with phone number or street name):  [place], [place].  Phone:  [phone] Fax:  [phone]  Has the patient been seen for an appointment in the last year OR does the patient have an upcoming appointment? Yes.

## 2021-02-08 NOTE — Telephone Encounter (Signed)
Requested medication (s) are due for refill today: yes  Requested medication (s) are on the active medication list: yes  Last refill: 10/06/20  #60  0 refills  Future visit scheduled yes 04/16/21  Notes to clinic:Not delegated  Requested Prescriptions  Pending Prescriptions Disp Refills   HYDROcodone-acetaminophen (NORCO) 10-325 MG tablet 60 tablet 0    Sig: Take 1 tablet by mouth every 8 (eight) hours as needed for moderate pain.     Not Delegated - Analgesics:  Opioid Agonist Combinations Failed - 02/08/2021  2:45 PM      Failed - This refill cannot be delegated      Failed - Urine Drug Screen completed in last 360 days      Passed - Valid encounter within last 6 months    Recent Outpatient Visits           3 weeks ago PAD (peripheral artery disease) [facility])   [facility] Tally Joe T, FNP   4 months ago Neuritis or radiculitis due to rupture of lumbar intervertebral disc   [facility] Birdie Sons, MD   1 year ago Recurrent major depressive disorder, in partial remission [facility])   [place], [place], [place]   2 years ago Sinus pressure   [facility] Virginia Crews, MD   2 years ago Elevated blood pressure reading   [facility], Clearnce Sorrel, [place]       Future Appointments             In 2 months Rollene Rotunda, Jaci Standard, [place], [place]

## 2021-02-10 MED ORDER — HYDROCODONE-ACETAMINOPHEN 10-325 MG PO TABS: 1.0000 | ORAL_TABLET | Freq: Three times a day (TID) | ORAL | 0 refills | Status: DC | PRN

## 2021-02-10 NOTE — Telephone Encounter (Signed)
Please review. Last office visit 01/18/2021.  KP

## 2021-02-19 ENCOUNTER — Encounter (INDEPENDENT_AMBULATORY_CARE_PROVIDER_SITE_OTHER): Payer: Self-pay | Admitting: Nurse Practitioner

## 2021-02-19 ENCOUNTER — Encounter (INDEPENDENT_AMBULATORY_CARE_PROVIDER_SITE_OTHER): Payer: Self-pay

## 2021-03-01 ENCOUNTER — Other Ambulatory Visit: Payer: Self-pay | Admitting: Family Medicine

## 2021-03-01 DIAGNOSIS — F411 Generalized anxiety disorder: Secondary | ICD-10-CM

## 2021-03-02 NOTE — Telephone Encounter (Signed)
Requested medication (s) are due for refill today: yes  Requested medication (s) are on the active medication list: yes  Last refill:  9/28/2 #60 with 2 refills  Future visit scheduled: yes  Notes to clinic:  Please review for refill. Refill not delegated per protocol.     Requested Prescriptions  Pending Prescriptions Disp Refills   ALPRAZolam (XANAX) 1 MG tablet [Pharmacy Med Name: ALPRAZOLAM 1 MG TAB] 60 tablet     Sig: TAKE 1/2 TO 1 TABLET BY MOUTH TWICE DAILY AS NEEDED FOR ANXIETY     Not Delegated - Psychiatry:  Anxiolytics/Hypnotics Failed - 03/01/2021 10:49 AM      Failed - This refill cannot be delegated      Failed - Urine Drug Screen completed in last 360 days      Passed - Valid encounter within last 6 months    Recent Outpatient Visits           1 month ago PAD (peripheral artery disease) [facility])   [facility] Tally Joe T, FNP   4 months ago Neuritis or radiculitis due to rupture of lumbar intervertebral disc   [facility] Birdie Sons, MD   1 year ago Recurrent major depressive disorder, in partial remission [facility])   [facility] [place], [place], [place]   2 years ago Sinus pressure   [facility] Virginia Crews, MD   2 years ago Elevated blood pressure reading   [facility], Clearnce Sorrel, [place]       Future Appointments             In 1 month Rollene Rotunda, Jaci Standard, [place], [place]

## 2021-03-03 ENCOUNTER — Other Ambulatory Visit (INDEPENDENT_AMBULATORY_CARE_PROVIDER_SITE_OTHER): Payer: Self-pay | Admitting: Nurse Practitioner

## 2021-03-03 ENCOUNTER — Encounter (INDEPENDENT_AMBULATORY_CARE_PROVIDER_SITE_OTHER): Payer: Self-pay | Admitting: Nurse Practitioner

## 2021-03-03 ENCOUNTER — Ambulatory Visit (INDEPENDENT_AMBULATORY_CARE_PROVIDER_SITE_OTHER): Payer: Medicare Other | Admitting: Nurse Practitioner

## 2021-03-03 ENCOUNTER — Other Ambulatory Visit: Payer: Self-pay

## 2021-03-03 ENCOUNTER — Ambulatory Visit (INDEPENDENT_AMBULATORY_CARE_PROVIDER_SITE_OTHER): Payer: Medicare Other

## 2021-03-03 VITALS — BP 143/80 | HR 64 | Resp 16 | Ht 65.0 in | Wt 139.0 lb

## 2021-03-03 DIAGNOSIS — M5442 Lumbago with sciatica, left side: Secondary | ICD-10-CM | POA: Diagnosis not present

## 2021-03-03 DIAGNOSIS — E7800 Pure hypercholesterolemia, unspecified: Secondary | ICD-10-CM | POA: Diagnosis not present

## 2021-03-03 DIAGNOSIS — M109 Gout, unspecified: Secondary | ICD-10-CM | POA: Insufficient documentation

## 2021-03-03 DIAGNOSIS — M5430 Sciatica, unspecified side: Secondary | ICD-10-CM | POA: Insufficient documentation

## 2021-03-03 DIAGNOSIS — I739 Peripheral vascular disease, unspecified: Secondary | ICD-10-CM | POA: Diagnosis not present

## 2021-03-14 ENCOUNTER — Encounter (INDEPENDENT_AMBULATORY_CARE_PROVIDER_SITE_OTHER): Payer: Self-pay | Admitting: Nurse Practitioner

## 2021-03-14 NOTE — Progress Notes (Signed)
Subjective:    Patient ID: Patricia Decker, female    DOB: 27-Dec-1955, 66 y.o.   MRN: [id number] Chief Complaint  Patient presents with   Follow-up    ultrasound    Patricia Decker is a 66 year old female that presents today as a referral for left lower extremity claudication that begins in her left hip.  The patient also has substantial history of lower back issues as well as left-sided sciatica.  The patient notes that she can only walk for short distance before having pain and needing to stop.  She denies rest pain like symptoms.  She denies any open wounds or ulcerations.  Today noninvasive studies show an ABI of 1.08 on the right and 0.73 on the left.  The patient has triphasic tibial artery waveforms on the right with good toe waveforms with monophasic waveforms in the left with slightly dampened toe waveforms.    Review of Systems  Musculoskeletal:  Positive for back pain and gait problem.  All other systems reviewed and are negative.     Objective:   Physical Exam Vitals reviewed.  HENT:     Head: Normocephalic.  Cardiovascular:     Rate and Rhythm: Normal rate.     Pulses:          Dorsalis pedis pulses are 2+ on the right side and 1+ on the left side.       Posterior tibial pulses are 2+ on the right side and detected w/ Doppler on the left side.  Pulmonary:     Effort: Pulmonary effort is normal.  Skin:    General: Skin is warm and dry.  Neurological:     Mental Status: She is alert and oriented to person, place, and time.  Psychiatric:        Mood and Affect: Mood normal.        Behavior: Behavior normal.        Thought Content: Thought content normal.        Judgment: Judgment normal.    BP (!) 143/80 (BP Location: Left Arm)    Pulse 64    Resp 16    Ht 5\' 5"  (1.651 m)    Wt 139 lb (63 kg)    BMI 23.13 kg/m   Past Medical History:  Diagnosis Date   Anemia    Anxiety    Arthritis    Asthma    Cataract    removed   Depression    Wears dentures     Full upper, partial lower    Social History   Socioeconomic History   Marital status: Widowed    Spouse name: Not on file   Number of children: 1   Years of education: Not on file   Highest education level: Some college, no degree  Occupational History   Occupation: disability  Tobacco Use   Smoking status: Never   Smokeless tobacco: Never  Vaping Use   Vaping Use: Never used  Substance and Sexual Activity   Alcohol use: No   Drug use: No   Sexual activity: Not on file  Other Topics Concern   Not on file  Social History Narrative   Not on file   Social Determinants of Health   Financial Resource Strain: Not on file  Food Insecurity: Not on file  Transportation Needs: Not on file  Physical Activity: Not on file  Stress: Not on file  Social Connections: Not on file  Intimate Partner Violence: Not on file  Past Surgical History:  Procedure Laterality Date   ABDOMINAL HYSTERECTOMY     APPENDECTOMY     CATARACT EXTRACTION, BILATERAL     CHOLECYSTECTOMY     COLONOSCOPY WITH PROPOFOL N/A 06/30/2016   Procedure: COLONOSCOPY WITH PROPOFOL;  Surgeon: Jonathon Bellows, MD;  Location: [facility];  Service: Endoscopy;  Laterality: N/A;   EAR CYST EXCISION Left 07/31/2019   Procedure: EXCISION EAR LESION;  Surgeon: Carloyn Manner, MD;  Location: [place];  Service: ENT;  Laterality: Left;  Latex    Family History  Problem Relation Age of Onset   Alzheimer's disease Sister    Breast cancer Sister 56    Allergies  Allergen Reactions   Penicillins    Sulfa Antibiotics     Indigestion   Latex Rash    Gloves irritate hands when worn    CBC Latest Ref Rng & Units 05/06/2020 05/08/2019 04/10/2019  WBC 3.4 - 10.8 x10E3/uL 7.4 6.6 5.2  Hemoglobin 11.1 - 15.9 g/dL 11.5 10.7(L) 10.5(L)  Hematocrit 34.0 - 46.6 % 35.9 33.2(L) 31.8(L)  Platelets 150 - 450 x10E3/uL 430 308 310      CMP     Component Value Date/Time   NA 140 05/06/2020 1419   K 4.5 05/06/2020  1419   CL 103 05/06/2020 1419   CO2 20 05/06/2020 1419   GLUCOSE 98 05/06/2020 1419   BUN 14 05/06/2020 1419   CREATININE 0.86 05/06/2020 1419   CALCIUM 9.7 05/06/2020 1419   PROT 7.5 05/06/2020 1419   ALBUMIN 4.6 05/06/2020 1419   AST 21 05/06/2020 1419   ALT 10 05/06/2020 1419   ALKPHOS 74 05/06/2020 1419   BILITOT <0.2 05/06/2020 1419   GFRNONAA 73 03/21/2019 0931   GFRAA 84 03/21/2019 0931     VAS [place] ABI WITH/WO TBI  Result Date: 03/05/2021  LOWER EXTREMITY DOPPLER STUDY Patient Name:  Patricia Decker  Date of Exam:   03/03/2021 Medical Rec #: [id number]         Accession #:    [id number] Date of Birth: [birth year]          Patient Gender: F Patient Age:   64 years Exam Location:  [facility] Procedure:      VAS [place] ABI WITH/WO TBI Referring Phys: --------------------------------------------------------------------------------  Indications: Left leg claudication starts in lt hip.  Performing Technologist: Concha Norway RVT  Examination Guidelines: A complete evaluation includes at minimum, Doppler waveform signals and systolic blood pressure reading at the level of bilateral brachial, anterior tibial, and posterior tibial arteries, when vessel segments are accessible. Bilateral testing is considered an integral part of a complete examination. Photoelectric Plethysmograph (PPG) waveforms and toe systolic pressure readings are included as required and additional duplex testing as needed. Limited examinations for reoccurring indications may be performed as noted.  ABI Findings: +---------+------------------+-----+---------+--------+  Right     Rt Pressure (mmHg) Index Waveform  Comment   +---------+------------------+-----+---------+--------+  Brachial  150                                          +---------+------------------+-----+---------+--------+  ATA       155                      triphasic 1.03      +---------+------------------+-----+---------+--------+  PTA       162  1.08  triphasic           +---------+------------------+-----+---------+--------+  Great Toe 128                0.85  Normal              +---------+------------------+-----+---------+--------+ +---------+------------------+-----+----------+-------+  Left      Lt Pressure (mmHg) Index Waveform   Comment  +---------+------------------+-----+----------+-------+  Brachial  150                                          +---------+------------------+-----+----------+-------+  ATA       118                      monophasic .79      +---------+------------------+-----+----------+-------+  PTA       110                0.73  monophasic          +---------+------------------+-----+----------+-------+  Great Toe 77                 0.51  Abnormal            +---------+------------------+-----+----------+-------+  Summary: Right: Resting right ankle-brachial index is within normal range. No evidence of significant right lower extremity arterial disease. The right toe-brachial index is normal. Left: Resting left ankle-brachial index indicates moderate left lower extremity arterial disease. The left toe-brachial index is abnormal.  *See table(s) above for measurements and observations.  Electronically signed by [name] MD on 03/05/2021 at 10:02:50 AM.    Final        Assessment & Plan:   1. PAD (peripheral artery disease) (HCC) Discussed the patient's results and medication of PAD.  It is likely that much of her symptoms are related to her PAD but her lower back issues as well as her sciatica, following intervention she may still have some pain.  We discussed angiogram to intervene, however the patient is hesitant.  We will have patient return for additional studies in order to better ascertain the levels of stenosis and discussion of possible intervention.  2. Left-sided low back pain with left-sided sciatica, unspecified chronicity Some of the patient's left-sided pain may be related to her sciatica.  3.  Hypercholesterolemia Continue statin as ordered and reviewed, no changes at this time     Current Outpatient Medications on File Prior to Visit  Medication Sig Dispense Refill   ALPRAZolam (XANAX) 1 MG tablet TAKE 1/2 TO 1 TABLET BY MOUTH TWICE DAILY AS NEEDED FOR ANXIETY 60 tablet 2   HYDROcodone-acetaminophen (NORCO) 10-325 MG tablet Take 1 tablet by mouth every 8 (eight) hours as needed for moderate pain. 60 tablet 0   ARIPIPRAZOLE PO Take 15 mg by mouth daily.  (Patient not taking: Reported on 03/03/2021)     augmented betamethasone dipropionate (DIPROLENE-AF) 0.05 % cream Apply topically as needed.  (Patient not taking: Reported on 03/03/2021)     diclofenac (VOLTAREN) 75 MG EC tablet Take 75 mg by mouth 2 (two) times daily. (Patient not taking: Reported on 03/03/2021)     Ferrous Sulfate (IRON PO) Take by mouth 2 (two) times a week. (Patient not taking: Reported on 03/03/2021)     gabapentin (NEURONTIN) 300 MG capsule Take 1 capsule (300 mg total) by mouth 3 (three) times daily. (Patient not taking: Reported on 03/03/2021) 90  capsule 3   hydrOXYzine (VISTARIL) 50 MG capsule Take 50 mg by mouth as needed (for itching).  (Patient not taking: Reported on 03/03/2021)     ondansetron (ZOFRAN) 4 MG tablet Take 1 tablet (4 mg total) by mouth every 8 (eight) hours as needed for up to 10 doses for nausea or vomiting. (Patient not taking: Reported on 03/03/2021) 20 tablet 0   No current facility-administered medications on file prior to visit.    There are no Patient Instructions on file for this visit. No follow-ups on file.   Kris Hartmann, NP

## 2021-03-16 ENCOUNTER — Other Ambulatory Visit: Payer: Self-pay | Admitting: Family Medicine

## 2021-03-16 DIAGNOSIS — Z1231 Encounter for screening mammogram for malignant neoplasm of breast: Secondary | ICD-10-CM

## 2021-03-18 ENCOUNTER — Other Ambulatory Visit (INDEPENDENT_AMBULATORY_CARE_PROVIDER_SITE_OTHER): Payer: Self-pay | Admitting: Nurse Practitioner

## 2021-03-18 DIAGNOSIS — I739 Peripheral vascular disease, unspecified: Secondary | ICD-10-CM

## 2021-03-19 ENCOUNTER — Encounter (INDEPENDENT_AMBULATORY_CARE_PROVIDER_SITE_OTHER): Payer: Medicare Other

## 2021-03-19 ENCOUNTER — Other Ambulatory Visit: Payer: Self-pay

## 2021-03-19 ENCOUNTER — Ambulatory Visit (INDEPENDENT_AMBULATORY_CARE_PROVIDER_SITE_OTHER): Payer: Medicare Other

## 2021-03-19 ENCOUNTER — Ambulatory Visit (INDEPENDENT_AMBULATORY_CARE_PROVIDER_SITE_OTHER): Payer: Medicare Other | Admitting: Nurse Practitioner

## 2021-03-19 ENCOUNTER — Telehealth (INDEPENDENT_AMBULATORY_CARE_PROVIDER_SITE_OTHER): Payer: Self-pay

## 2021-03-19 ENCOUNTER — Encounter (INDEPENDENT_AMBULATORY_CARE_PROVIDER_SITE_OTHER): Payer: Self-pay | Admitting: Nurse Practitioner

## 2021-03-19 VITALS — BP 126/70 | HR 67 | Resp 16 | Wt 140.0 lb

## 2021-03-19 DIAGNOSIS — I739 Peripheral vascular disease, unspecified: Secondary | ICD-10-CM

## 2021-03-19 DIAGNOSIS — E7800 Pure hypercholesterolemia, unspecified: Secondary | ICD-10-CM | POA: Diagnosis not present

## 2021-03-19 DIAGNOSIS — M5442 Lumbago with sciatica, left side: Secondary | ICD-10-CM

## 2021-03-19 NOTE — Telephone Encounter (Signed)
I attempted to contact the patient to schedule a LLE angio with Dr. Lucky Cowboy on 04/02/21. I was unable to leave a message due to her service not being available to do so.

## 2021-03-22 ENCOUNTER — Telehealth (INDEPENDENT_AMBULATORY_CARE_PROVIDER_SITE_OTHER): Payer: Self-pay

## 2021-03-22 NOTE — Telephone Encounter (Signed)
Patient returned my call and is now scheduled with Dr. Lucky Cowboy for a LLE angio on 04/02/21 with a 11:00 am arrival time to the MM. Pre-procedure instructions were discussed and will be mailed.

## 2021-03-24 ENCOUNTER — Other Ambulatory Visit: Payer: Self-pay

## 2021-03-24 ENCOUNTER — Telehealth (INDEPENDENT_AMBULATORY_CARE_PROVIDER_SITE_OTHER): Payer: Medicare Other | Admitting: Family Medicine

## 2021-03-24 ENCOUNTER — Encounter: Payer: Self-pay | Admitting: Family Medicine

## 2021-03-24 DIAGNOSIS — J0141 Acute recurrent pansinusitis: Secondary | ICD-10-CM | POA: Insufficient documentation

## 2021-03-24 DIAGNOSIS — J3489 Other specified disorders of nose and nasal sinuses: Secondary | ICD-10-CM | POA: Diagnosis not present

## 2021-03-24 MED ORDER — AZITHROMYCIN 250 MG PO TABS: ORAL_TABLET | ORAL | 0 refills | Status: AC

## 2021-03-24 NOTE — Assessment & Plan Note (Signed)
Clear-yellow discharge Thin-thick consistency Encouraged OTC Mucinex DM to assist and increase water

## 2021-03-24 NOTE — Progress Notes (Signed)
Virtual telephone visit    Virtual Visit via Telephone Note   This visit type was conducted due to national recommendations for restrictions regarding the COVID-19 Pandemic (e.g. social distancing) in an effort to limit this patient's exposure and mitigate transmission in our community. Due to her co-morbid illnesses, this patient is at least at moderate risk for complications without adequate follow up. This format is felt to be most appropriate for this patient at this time. The patient did not have access to video technology or had technical difficulties with video requiring transitioning to audio format only (telephone). Physical exam was limited to content and character of the telephone converstion.    Patient location: home Provider location: [facility]  I discussed the limitations of evaluation and management by telemedicine and the availability of in person appointments. The patient expressed understanding and agreed to proceed.   Visit Date: 03/24/2021  Today's healthcare provider: Gwyneth Sprout, FNP   Chief Complaint  Patient presents with   Sinus Problem   Subjective    Sinus Problem This is a new problem. The current episode started in the past 7 days. There has been no fever. Associated symptoms include headaches and sinus pressure. Pertinent negatives include no chills, congestion, coughing, diaphoresis, ear pain, hoarse voice, neck pain, shortness of breath, sneezing, sore throat or swollen glands. Treatments tried: Robitussin DM. The treatment provided no relief.    Add Zyrtec or Claratin, Add Flonase- twice a day. Add Mucinex DM. Increase water intake. Start antibiotic after day 10.     Medications: Outpatient Medications Prior to Visit  Medication Sig   ALPRAZolam (XANAX) 1 MG tablet TAKE 1/2 TO 1 TABLET BY MOUTH TWICE DAILY AS NEEDED FOR ANXIETY   ARIPIPRAZOLE PO Take 15 mg by mouth daily.   augmented betamethasone dipropionate (DIPROLENE-AF) 0.05 % cream Apply  topically as needed.   diclofenac (VOLTAREN) 75 MG EC tablet Take 75 mg by mouth 2 (two) times daily.   Ferrous Sulfate (IRON PO) Take by mouth 2 (two) times a week.   gabapentin (NEURONTIN) 300 MG capsule Take 1 capsule (300 mg total) by mouth 3 (three) times daily.   HYDROcodone-acetaminophen (NORCO) 10-325 MG tablet Take 1 tablet by mouth every 8 (eight) hours as needed for moderate pain.   hydrOXYzine (VISTARIL) 50 MG capsule Take 50 mg by mouth as needed (for itching).   ondansetron (ZOFRAN) 4 MG tablet Take 1 tablet (4 mg total) by mouth every 8 (eight) hours as needed for up to 10 doses for nausea or vomiting.   No facility-administered medications prior to visit.    Review of Systems  Constitutional:  Negative for chills and diaphoresis.  HENT:  Positive for sinus pressure. Negative for congestion, ear pain, hoarse voice, sneezing and sore throat.   Respiratory:  Negative for cough and shortness of breath.   Musculoskeletal:  Negative for neck pain.  Neurological:  Positive for headaches.     Objective    There were no vitals taken for this visit.     Assessment & Plan     Problem List Items Addressed This Visit       Respiratory   Acute recurrent pansinusitis - Primary    Encourage OTC medications at this time; however, abx provided if symptoms continue >10 days. Current at day 7 Generalized complaints of pressure      Relevant Medications   azithromycin (ZITHROMAX) 250 MG tablet     Other   Sinus pressure  Add flonase and oral zyrtec/claritin to assist Denies eye or ear involvement Continue use for 1 week- may not not effect until after day 4.      Rhinorrhea    Clear-yellow discharge Thin-thick consistency Encouraged OTC Mucinex DM to assist and increase water        Return if symptoms worsen or fail to improve.    I discussed the assessment and treatment plan with the patient. The patient was provided an opportunity to ask questions and all  were answered. The patient agreed with the plan and demonstrated an understanding of the instructions.   The patient was advised to call back or seek an in-person evaluation if the symptoms worsen or if the condition fails to improve as anticipated.  I provided 7 minutes of non-face-to-face time during this encounter.  Vonna Kotyk, FNP, have reviewed all documentation for this visit. The documentation on 03/24/21 for the exam, diagnosis, procedures, and orders are all accurate and complete.  Patient seen and examined by Tally Joe,  FNP note scribed by [employer], [place], [place] [phone] (phone) [phone] (fax)  [place]

## 2021-03-24 NOTE — Assessment & Plan Note (Signed)
Encourage OTC medications at this time; however, abx provided if symptoms continue >10 days. Current at day 7 Generalized complaints of pressure

## 2021-03-24 NOTE — Progress Notes (Deleted)
MyChart Video Visit    Virtual Visit via Video Note   This visit type was conducted due to national recommendations for restrictions regarding the COVID-19 Pandemic (e.g. social distancing) in an effort to limit this patient's exposure and mitigate transmission in our community. This patient is at least at moderate risk for complications without adequate follow up. This format is felt to be most appropriate for this patient at this time. Physical exam was limited by quality of the video and audio technology used for the visit.   Patient location: *** Provider location: ***  I discussed the limitations of evaluation and management by telemedicine and the availability of in person appointments. The patient expressed understanding and agreed to proceed.  Patient: Patricia Decker   DOB: Mar 04, 1956   66 y.o. Female  MRN: [id number] Visit Date: 03/24/2021  Today's healthcare provider: Gwyneth Sprout, FNP   Chief Complaint  Patient presents with   Sinus Problem   Subjective    Sinus Problem This is a new problem. The current episode started in the past 7 days. The problem is unchanged. Associated symptoms include headaches and sinus pressure. Pertinent negatives include no chills, congestion, coughing, diaphoresis, ear pain, hoarse voice, neck pain, shortness of breath, sneezing, sore throat or swollen glands. Treatments tried: Robitussin DM. The treatment provided no relief.   ***   Medications: Outpatient Medications Prior to Visit  Medication Sig   ALPRAZolam (XANAX) 1 MG tablet TAKE 1/2 TO 1 TABLET BY MOUTH TWICE DAILY AS NEEDED FOR ANXIETY   ARIPIPRAZOLE PO Take 15 mg by mouth daily.  (Patient not taking: Reported on 03/03/2021)   augmented betamethasone dipropionate (DIPROLENE-AF) 0.05 % cream Apply topically as needed.  (Patient not taking: Reported on 03/03/2021)   diclofenac (VOLTAREN) 75 MG EC tablet Take 75 mg by mouth 2 (two) times daily. (Patient not taking: Reported on  03/03/2021)   Ferrous Sulfate (IRON PO) Take by mouth 2 (two) times a week. (Patient not taking: Reported on 03/03/2021)   gabapentin (NEURONTIN) 300 MG capsule Take 1 capsule (300 mg total) by mouth 3 (three) times daily. (Patient not taking: Reported on 03/03/2021)   HYDROcodone-acetaminophen (NORCO) 10-325 MG tablet Take 1 tablet by mouth every 8 (eight) hours as needed for moderate pain.   hydrOXYzine (VISTARIL) 50 MG capsule Take 50 mg by mouth as needed (for itching).  (Patient not taking: Reported on 03/03/2021)   ondansetron (ZOFRAN) 4 MG tablet Take 1 tablet (4 mg total) by mouth every 8 (eight) hours as needed for up to 10 doses for nausea or vomiting. (Patient not taking: Reported on 03/03/2021)   No facility-administered medications prior to visit.    Review of Systems  Constitutional:  Negative for chills and diaphoresis.  HENT:  Positive for postnasal drip, rhinorrhea and sinus pressure. Negative for congestion, ear pain, hoarse voice, sneezing and sore throat.   Respiratory:  Negative for cough and shortness of breath.   Musculoskeletal:  Negative for neck pain.  Neurological:  Positive for headaches.   {Labs   Heme   Chem   Endocrine   Serology   Results Review (optional):23779}  Objective    There were no vitals taken for this visit. {Show previous vital signs (optional):23777}  Physical Exam     Assessment & Plan     ***  No follow-ups on file.     I discussed the assessment and treatment plan with the patient. The patient was provided an opportunity to ask questions  and all were answered. The patient agreed with the plan and demonstrated an understanding of the instructions.   The patient was advised to call back or seek an in-person evaluation if the symptoms worsen or if the condition fails to improve as anticipated.  I provided *** minutes of non-face-to-face time during this encounter.  {provider attestation***:1}  Gwyneth Sprout, [place] [phone] (phone) [phone] (fax)  [place]

## 2021-03-24 NOTE — Assessment & Plan Note (Signed)
Add flonase and oral zyrtec/claritin to assist Denies eye or ear involvement Continue use for 1 week- may not not effect until after day 4.

## 2021-03-28 ENCOUNTER — Encounter (INDEPENDENT_AMBULATORY_CARE_PROVIDER_SITE_OTHER): Payer: Self-pay | Admitting: Nurse Practitioner

## 2021-03-28 NOTE — H&P (View-Only) (Signed)
Subjective:    Patient ID: Patricia Decker, female    DOB: 1955/09/28, 66 y.o.   MRN: [id number] Chief Complaint  Patient presents with   Follow-up    Ultrasound follow up    Patricia Decker is a 66 year old female that presents today as a referral for left lower extremity claudication that begins in her left hip.  The patient also has substantial history of lower back issues as well as left-sided sciatica.  She has had multiple interventions including physical therapy, cortisone shots and nothing has helped.  The patient notes that she can only walk for short distance before having pain and needing to stop.  She denies rest pain like symptoms.  She denies any open wounds or ulcerations.  Today patient's left lower extremity arterial duplex shows monophasic waveforms beginning in the distal common femoral artery all the way to the distal tibial arteries.  The patient's aortoiliac duplex shows monophasic waveforms throughout the common and external iliac arteries.  The right common and external iliac arteries have biphasic waveforms.  The patient's previous ABI was 1.08 on the right and 0.73 on the left.   Review of Systems  Musculoskeletal:  Positive for gait problem.  All other systems reviewed and are negative.     Objective:   Physical Exam Vitals reviewed.  HENT:     Head: Normocephalic.  Cardiovascular:     Rate and Rhythm: Normal rate.     Pulses:          Dorsalis pedis pulses are 1+ on the right side and detected w/ Doppler on the left side.       Posterior tibial pulses are 1+ on the right side and detected w/ Doppler on the left side.  Pulmonary:     Effort: Pulmonary effort is normal.  Neurological:     Mental Status: She is alert and oriented to person, place, and time.     Gait: Gait abnormal.  Psychiatric:        Mood and Affect: Mood normal.        Behavior: Behavior normal.        Thought Content: Thought content normal.        Judgment: Judgment normal.     BP 126/70 (BP Location: Right Arm)    Pulse 67    Resp 16    Wt 140 lb (63.5 kg)    BMI 23.30 kg/m   Past Medical History:  Diagnosis Date   Anemia    Anxiety    Arthritis    Asthma    Cataract    removed   Depression    Wears dentures    Full upper, partial lower    Social History   Socioeconomic History   Marital status: Widowed    Spouse name: Not on file   Number of children: 1   Years of education: Not on file   Highest education level: Some college, no degree  Occupational History   Occupation: disability  Tobacco Use   Smoking status: Never   Smokeless tobacco: Never  Vaping Use   Vaping Use: Never used  Substance and Sexual Activity   Alcohol use: No   Drug use: No   Sexual activity: Not on file  Other Topics Concern   Not on file  Social History Narrative   Not on file   Social Determinants of Health   Financial Resource Strain: Not on file  Food Insecurity: Not on file  Transportation Needs: Not on  file  Physical Activity: Not on file  Stress: Not on file  Social Connections: Not on file  Intimate Partner Violence: Not on file    Past Surgical History:  Procedure Laterality Date   ABDOMINAL HYSTERECTOMY     APPENDECTOMY     CATARACT EXTRACTION, BILATERAL     CHOLECYSTECTOMY     COLONOSCOPY WITH PROPOFOL N/A 06/30/2016   Procedure: COLONOSCOPY WITH PROPOFOL;  Surgeon: Jonathon Bellows, MD;  Location: [facility];  Service: Endoscopy;  Laterality: N/A;   EAR CYST EXCISION Left 07/31/2019   Procedure: EXCISION EAR LESION;  Surgeon: Carloyn Manner, MD;  Location: [place];  Service: ENT;  Laterality: Left;  Latex    Family History  Problem Relation Age of Onset   Alzheimer's disease Sister    Breast cancer Sister 37    Allergies  Allergen Reactions   Penicillins    Sulfa Antibiotics     Indigestion   Latex Rash    Gloves irritate hands when worn    CBC Latest Ref Rng & Units 05/06/2020 05/08/2019 04/10/2019  WBC 3.4 - 10.8  x10E3/uL 7.4 6.6 5.2  Hemoglobin 11.1 - 15.9 g/dL 11.5 10.7(L) 10.5(L)  Hematocrit 34.0 - 46.6 % 35.9 33.2(L) 31.8(L)  Platelets 150 - 450 x10E3/uL 430 308 310      CMP     Component Value Date/Time   NA 140 05/06/2020 1419   K 4.5 05/06/2020 1419   CL 103 05/06/2020 1419   CO2 20 05/06/2020 1419   GLUCOSE 98 05/06/2020 1419   BUN 14 05/06/2020 1419   CREATININE 0.86 05/06/2020 1419   CALCIUM 9.7 05/06/2020 1419   PROT 7.5 05/06/2020 1419   ALBUMIN 4.6 05/06/2020 1419   AST 21 05/06/2020 1419   ALT 10 05/06/2020 1419   ALKPHOS 74 05/06/2020 1419   BILITOT <0.2 05/06/2020 1419   GFRNONAA 73 03/21/2019 0931   GFRAA 84 03/21/2019 0931     VAS [place] ABI WITH/WO TBI  Result Date: 03/05/2021  LOWER EXTREMITY DOPPLER STUDY Patient Name:  Patricia Decker  Date of Exam:   03/03/2021 Medical Rec #: [id number]         Accession #:    [id number] Date of Birth: 1955/09/10          Patient Gender: F Patient Age:   60 years Exam Location:  [facility] Procedure:      VAS [place] ABI WITH/WO TBI Referring Phys: --------------------------------------------------------------------------------  Indications: Left leg claudication starts in lt hip.  Performing Technologist: Concha Norway RVT  Examination Guidelines: A complete evaluation includes at minimum, Doppler waveform signals and systolic blood pressure reading at the level of bilateral brachial, anterior tibial, and posterior tibial arteries, when vessel segments are accessible. Bilateral testing is considered an integral part of a complete examination. Photoelectric Plethysmograph (PPG) waveforms and toe systolic pressure readings are included as required and additional duplex testing as needed. Limited examinations for reoccurring indications may be performed as noted.  ABI Findings: +---------+------------------+-----+---------+--------+  Right     Rt Pressure (mmHg) Index Waveform  Comment    +---------+------------------+-----+---------+--------+  Brachial  150                                          +---------+------------------+-----+---------+--------+  ATA       155  triphasic 1.03      +---------+------------------+-----+---------+--------+  PTA       162                1.08  triphasic           +---------+------------------+-----+---------+--------+  Great Toe 128                0.85  Normal              +---------+------------------+-----+---------+--------+ +---------+------------------+-----+----------+-------+  Left      Lt Pressure (mmHg) Index Waveform   Comment  +---------+------------------+-----+----------+-------+  Brachial  150                                          +---------+------------------+-----+----------+-------+  ATA       118                      monophasic .79      +---------+------------------+-----+----------+-------+  PTA       110                0.73  monophasic          +---------+------------------+-----+----------+-------+  Great Toe 77                 0.51  Abnormal            +---------+------------------+-----+----------+-------+  Summary: Right: Resting right ankle-brachial index is within normal range. No evidence of significant right lower extremity arterial disease. The right toe-brachial index is normal. Left: Resting left ankle-brachial index indicates moderate left lower extremity arterial disease. The left toe-brachial index is abnormal.  *See table(s) above for measurements and observations.  Electronically signed by [name] MD on 03/05/2021 at 10:02:50 AM.    Final        Assessment & Plan:   1. PAD (peripheral artery disease) (HCC) Recommend:  The patient has experienced increased symptoms and is now describing lifestyle limiting claudication and mild rest pain.   Given the severity of the patient's lower extremity symptoms the patient should undergo angiography and intervention.  Risk and benefits were reviewed the  patient.  Indications for the procedure were reviewed.  All questions were answered, the patient agrees to proceed.   The patient should continue walking and begin a more formal exercise program.  The patient should continue antiplatelet therapy and aggressive treatment of the lipid abnormalities  The patient will follow up with me after the angiogram.    2. Left-sided low back pain with left-sided sciatica, unspecified chronicity While sciatica and claudication-like symptoms can be somewhat similar based on the patient's description of pain as well as multiple treatments that have not helped, I suspect this is largely related to PAD.  While in renal patient as outlined above.  3. Hypercholesterolemia Continue statin as ordered and reviewed, no changes at this time    Current Outpatient Medications on File Prior to Visit  Medication Sig Dispense Refill   ALPRAZolam (XANAX) 1 MG tablet TAKE 1/2 TO 1 TABLET BY MOUTH TWICE DAILY AS NEEDED FOR ANXIETY 60 tablet 2   HYDROcodone-acetaminophen (NORCO) 10-325 MG tablet Take 1 tablet by mouth every 8 (eight) hours as needed for moderate pain. 60 tablet 0   ARIPIPRAZOLE PO Take 15 mg by mouth daily.     augmented betamethasone dipropionate (DIPROLENE-AF) 0.05 % cream Apply topically as needed.  diclofenac (VOLTAREN) 75 MG EC tablet Take 75 mg by mouth 2 (two) times daily.     Ferrous Sulfate (IRON PO) Take by mouth 2 (two) times a week.     gabapentin (NEURONTIN) 300 MG capsule Take 1 capsule (300 mg total) by mouth 3 (three) times daily. 90 capsule 3   hydrOXYzine (VISTARIL) 50 MG capsule Take 50 mg by mouth as needed (for itching).     ondansetron (ZOFRAN) 4 MG tablet Take 1 tablet (4 mg total) by mouth every 8 (eight) hours as needed for up to 10 doses for nausea or vomiting. 20 tablet 0   No current facility-administered medications on file prior to visit.    There are no Patient Instructions on file for this visit. No follow-ups on  file.   Kris Hartmann, NP

## 2021-03-28 NOTE — Progress Notes (Signed)
Subjective:    Patient ID: Patricia Decker, female    DOB: [birth year], 66 y.o.   MRN: [id number] Chief Complaint  Patient presents with   Follow-up    Ultrasound follow up    Patricia Decker is a 66 year old female that presents today as a referral for left lower extremity claudication that begins in her left hip.  The patient also has substantial history of lower back issues as well as left-sided sciatica.  She has had multiple interventions including physical therapy, cortisone shots and nothing has helped.  The patient notes that she can only walk for short distance before having pain and needing to stop.  She denies rest pain like symptoms.  She denies any open wounds or ulcerations.  Today patient's left lower extremity arterial duplex shows monophasic waveforms beginning in the distal common femoral artery all the way to the distal tibial arteries.  The patient's aortoiliac duplex shows monophasic waveforms throughout the common and external iliac arteries.  The right common and external iliac arteries have biphasic waveforms.  The patient's previous ABI was 1.08 on the right and 0.73 on the left.   Review of Systems  Musculoskeletal:  Positive for gait problem.  All other systems reviewed and are negative.     Objective:   Physical Exam Vitals reviewed.  HENT:     Head: Normocephalic.  Cardiovascular:     Rate and Rhythm: Normal rate.     Pulses:          Dorsalis pedis pulses are 1+ on the right side and detected w/ Doppler on the left side.       Posterior tibial pulses are 1+ on the right side and detected w/ Doppler on the left side.  Pulmonary:     Effort: Pulmonary effort is normal.  Neurological:     Mental Status: She is alert and oriented to person, place, and time.     Gait: Gait abnormal.  Psychiatric:        Mood and Affect: Mood normal.        Behavior: Behavior normal.        Thought Content: Thought content normal.        Judgment: Judgment normal.     BP 126/70 (BP Location: Right Arm)    Pulse 67    Resp 16    Wt 140 lb (63.5 kg)    BMI 23.30 kg/m   Past Medical History:  Diagnosis Date   Anemia    Anxiety    Arthritis    Asthma    Cataract    removed   Depression    Wears dentures    Full upper, partial lower    Social History   Socioeconomic History   Marital status: Widowed    Spouse name: Not on file   Number of children: 1   Years of education: Not on file   Highest education level: Some college, no degree  Occupational History   Occupation: disability  Tobacco Use   Smoking status: Never   Smokeless tobacco: Never  Vaping Use   Vaping Use: Never used  Substance and Sexual Activity   Alcohol use: No   Drug use: No   Sexual activity: Not on file  Other Topics Concern   Not on file  Social History Narrative   Not on file   Social Determinants of Health   Financial Resource Strain: Not on file  Food Insecurity: Not on file  Transportation Needs: Not on  file  Physical Activity: Not on file  Stress: Not on file  Social Connections: Not on file  Intimate Partner Violence: Not on file    Past Surgical History:  Procedure Laterality Date   ABDOMINAL HYSTERECTOMY     APPENDECTOMY     CATARACT EXTRACTION, BILATERAL     CHOLECYSTECTOMY     COLONOSCOPY WITH PROPOFOL N/A 06/30/2016   Procedure: COLONOSCOPY WITH PROPOFOL;  Surgeon: Jonathon Bellows, MD;  Location: [facility];  Service: Endoscopy;  Laterality: N/A;   EAR CYST EXCISION Left 07/31/2019   Procedure: EXCISION EAR LESION;  Surgeon: Carloyn Manner, MD;  Location: [place];  Service: ENT;  Laterality: Left;  Latex    Family History  Problem Relation Age of Onset   Alzheimer's disease Sister    Breast cancer Sister 24    Allergies  Allergen Reactions   Penicillins    Sulfa Antibiotics     Indigestion   Latex Rash    Gloves irritate hands when worn    CBC Latest Ref Rng & Units 05/06/2020 05/08/2019 04/10/2019  WBC 3.4 - 10.8  x10E3/uL 7.4 6.6 5.2  Hemoglobin 11.1 - 15.9 g/dL 11.5 10.7(L) 10.5(L)  Hematocrit 34.0 - 46.6 % 35.9 33.2(L) 31.8(L)  Platelets 150 - 450 x10E3/uL 430 308 310      CMP     Component Value Date/Time   NA 140 05/06/2020 1419   K 4.5 05/06/2020 1419   CL 103 05/06/2020 1419   CO2 20 05/06/2020 1419   GLUCOSE 98 05/06/2020 1419   BUN 14 05/06/2020 1419   CREATININE 0.86 05/06/2020 1419   CALCIUM 9.7 05/06/2020 1419   PROT 7.5 05/06/2020 1419   ALBUMIN 4.6 05/06/2020 1419   AST 21 05/06/2020 1419   ALT 10 05/06/2020 1419   ALKPHOS 74 05/06/2020 1419   BILITOT <0.2 05/06/2020 1419   GFRNONAA 73 03/21/2019 0931   GFRAA 84 03/21/2019 0931     VAS [place] ABI WITH/WO TBI  Result Date: 03/05/2021  LOWER EXTREMITY DOPPLER STUDY Patient Name:  Patricia Decker  Date of Exam:   03/03/2021 Medical Rec #: [id number]         Accession #:    [id number] Date of Birth: 12/18/1955          Patient Gender: F Patient Age:   81 years Exam Location:  [facility] Procedure:      VAS [place] ABI WITH/WO TBI Referring Phys: --------------------------------------------------------------------------------  Indications: Left leg claudication starts in lt hip.  Performing Technologist: Concha Norway RVT  Examination Guidelines: A complete evaluation includes at minimum, Doppler waveform signals and systolic blood pressure reading at the level of bilateral brachial, anterior tibial, and posterior tibial arteries, when vessel segments are accessible. Bilateral testing is considered an integral part of a complete examination. Photoelectric Plethysmograph (PPG) waveforms and toe systolic pressure readings are included as required and additional duplex testing as needed. Limited examinations for reoccurring indications may be performed as noted.  ABI Findings: +---------+------------------+-----+---------+--------+  Right     Rt Pressure (mmHg) Index Waveform  Comment    +---------+------------------+-----+---------+--------+  Brachial  150                                          +---------+------------------+-----+---------+--------+  ATA       155  triphasic 1.03      +---------+------------------+-----+---------+--------+  PTA       162                1.08  triphasic           +---------+------------------+-----+---------+--------+  Great Toe 128                0.85  Normal              +---------+------------------+-----+---------+--------+ +---------+------------------+-----+----------+-------+  Left      Lt Pressure (mmHg) Index Waveform   Comment  +---------+------------------+-----+----------+-------+  Brachial  150                                          +---------+------------------+-----+----------+-------+  ATA       118                      monophasic .79      +---------+------------------+-----+----------+-------+  PTA       110                0.73  monophasic          +---------+------------------+-----+----------+-------+  Great Toe 77                 0.51  Abnormal            +---------+------------------+-----+----------+-------+  Summary: Right: Resting right ankle-brachial index is within normal range. No evidence of significant right lower extremity arterial disease. The right toe-brachial index is normal. Left: Resting left ankle-brachial index indicates moderate left lower extremity arterial disease. The left toe-brachial index is abnormal.  *See table(s) above for measurements and observations.  Electronically signed by [name] MD on 03/05/2021 at 10:02:50 AM.    Final        Assessment & Plan:   1. PAD (peripheral artery disease) (HCC) Recommend:  The patient has experienced increased symptoms and is now describing lifestyle limiting claudication and mild rest pain.   Given the severity of the patient's lower extremity symptoms the patient should undergo angiography and intervention.  Risk and benefits were reviewed the  patient.  Indications for the procedure were reviewed.  All questions were answered, the patient agrees to proceed.   The patient should continue walking and begin a more formal exercise program.  The patient should continue antiplatelet therapy and aggressive treatment of the lipid abnormalities  The patient will follow up with me after the angiogram.    2. Left-sided low back pain with left-sided sciatica, unspecified chronicity While sciatica and claudication-like symptoms can be somewhat similar based on the patient's description of pain as well as multiple treatments that have not helped, I suspect this is largely related to PAD.  While in renal patient as outlined above.  3. Hypercholesterolemia Continue statin as ordered and reviewed, no changes at this time    Current Outpatient Medications on File Prior to Visit  Medication Sig Dispense Refill   ALPRAZolam (XANAX) 1 MG tablet TAKE 1/2 TO 1 TABLET BY MOUTH TWICE DAILY AS NEEDED FOR ANXIETY 60 tablet 2   HYDROcodone-acetaminophen (NORCO) 10-325 MG tablet Take 1 tablet by mouth every 8 (eight) hours as needed for moderate pain. 60 tablet 0   ARIPIPRAZOLE PO Take 15 mg by mouth daily.     augmented betamethasone dipropionate (DIPROLENE-AF) 0.05 % cream Apply topically as needed.  diclofenac (VOLTAREN) 75 MG EC tablet Take 75 mg by mouth 2 (two) times daily.     Ferrous Sulfate (IRON PO) Take by mouth 2 (two) times a week.     gabapentin (NEURONTIN) 300 MG capsule Take 1 capsule (300 mg total) by mouth 3 (three) times daily. 90 capsule 3   hydrOXYzine (VISTARIL) 50 MG capsule Take 50 mg by mouth as needed (for itching).     ondansetron (ZOFRAN) 4 MG tablet Take 1 tablet (4 mg total) by mouth every 8 (eight) hours as needed for up to 10 doses for nausea or vomiting. 20 tablet 0   No current facility-administered medications on file prior to visit.    There are no Patient Instructions on file for this visit. No follow-ups on  file.   Kris Hartmann, NP

## 2021-03-29 DIAGNOSIS — H04223 Epiphora due to insufficient drainage, bilateral lacrimal glands: Secondary | ICD-10-CM | POA: Diagnosis not present

## 2021-03-29 DIAGNOSIS — H43813 Vitreous degeneration, bilateral: Secondary | ICD-10-CM | POA: Diagnosis not present

## 2021-03-29 DIAGNOSIS — H35011 Changes in retinal vascular appearance, right eye: Secondary | ICD-10-CM | POA: Diagnosis not present

## 2021-04-02 ENCOUNTER — Encounter
Admission: RE | Disposition: A | Discharge status: Home / Self Care | Payer: Self-pay | Source: Ambulatory Visit | Attending: Vascular Surgery

## 2021-04-02 ENCOUNTER — Ambulatory Visit
Admission: RE | Admit: 2021-04-02 | Discharge: 2021-04-02 | Disposition: A | Discharge status: Home / Self Care | Payer: Medicare Other | Source: Ambulatory Visit | Attending: Vascular Surgery | Admitting: Vascular Surgery

## 2021-04-02 ENCOUNTER — Encounter: Payer: Self-pay | Admitting: Vascular Surgery

## 2021-04-02 ENCOUNTER — Other Ambulatory Visit: Payer: Self-pay

## 2021-04-02 ENCOUNTER — Other Ambulatory Visit (INDEPENDENT_AMBULATORY_CARE_PROVIDER_SITE_OTHER): Payer: Self-pay | Admitting: Nurse Practitioner

## 2021-04-02 DIAGNOSIS — M5442 Lumbago with sciatica, left side: Secondary | ICD-10-CM | POA: Diagnosis not present

## 2021-04-02 DIAGNOSIS — I70201 Unspecified atherosclerosis of native arteries of extremities, right leg: Secondary | ICD-10-CM | POA: Diagnosis not present

## 2021-04-02 DIAGNOSIS — I70212 Atherosclerosis of native arteries of extremities with intermittent claudication, left leg: Secondary | ICD-10-CM | POA: Diagnosis not present

## 2021-04-02 DIAGNOSIS — I739 Peripheral vascular disease, unspecified: Secondary | ICD-10-CM

## 2021-04-02 DIAGNOSIS — I70219 Atherosclerosis of native arteries of extremities with intermittent claudication, unspecified extremity: Secondary | ICD-10-CM

## 2021-04-02 DIAGNOSIS — E7800 Pure hypercholesterolemia, unspecified: Secondary | ICD-10-CM | POA: Diagnosis not present

## 2021-04-02 DIAGNOSIS — I70222 Atherosclerosis of native arteries of extremities with rest pain, left leg: Secondary | ICD-10-CM | POA: Insufficient documentation

## 2021-04-02 HISTORY — PX: LOWER EXTREMITY ANGIOGRAPHY: CATH118251

## 2021-04-02 LAB — CREATININE, SERUM
Creatinine, Ser: 0.69 mg/dL (ref 0.44–1.00)
GFR, Estimated: >60 mL/min (ref >60)

## 2021-04-02 LAB — BUN: BUN: 13 mg/dL (ref 8–23)

## 2021-04-02 SURGERY — LOWER EXTREMITY ANGIOGRAPHY
Anesthesia: Moderate Sedation | Site: Leg Lower | Laterality: Left

## 2021-04-02 MED ORDER — SODIUM CHLORIDE 0.9% FLUSH: 3.0000 mL | Freq: Two times a day (BID) | INTRAVENOUS | Status: DC

## 2021-04-02 MED ORDER — CLINDAMYCIN PHOSPHATE 300 MG/50ML IV SOLN: 300.0000 mg | Freq: Once | INTRAVENOUS | Status: DC

## 2021-04-02 MED ORDER — HYDROCODONE-ACETAMINOPHEN 5-325 MG PO TABS
ORAL_TABLET | ORAL | Status: AC
  Filled 2021-04-02: qty 1

## 2021-04-02 MED ORDER — MIDAZOLAM HCL 2 MG/2ML IJ SOLN
INTRAMUSCULAR | Status: AC
  Filled 2021-04-02: qty 2

## 2021-04-02 MED ORDER — ASPIRIN EC 81 MG PO TBEC: 81.0000 mg | DELAYED_RELEASE_TABLET | Freq: Every day | ORAL | 2 refills | Status: AC

## 2021-04-02 MED ORDER — LABETALOL HCL 5 MG/ML IV SOLN: 10.0000 mg | INTRAVENOUS | Status: DC | PRN

## 2021-04-02 MED ORDER — HYDROMORPHONE HCL 1 MG/ML IJ SOLN: 1.0000 mg | Freq: Once | INTRAMUSCULAR | Status: DC | PRN

## 2021-04-02 MED ORDER — FAMOTIDINE 20 MG PO TABS: 40.0000 mg | ORAL_TABLET | Freq: Once | ORAL | Status: DC | PRN

## 2021-04-02 MED ORDER — ATORVASTATIN CALCIUM 10 MG PO TABS: 10.0000 mg | ORAL_TABLET | Freq: Every day | ORAL | 11 refills | Status: DC

## 2021-04-02 MED ORDER — FENTANYL CITRATE (PF) 100 MCG/2ML IJ SOLN
INTRAMUSCULAR | Status: DC | PRN
  Administered 2021-04-02: 50 ug via INTRAVENOUS
  Administered 2021-04-02 (×2): 25 ug via INTRAVENOUS

## 2021-04-02 MED ORDER — HYDRALAZINE HCL 20 MG/ML IJ SOLN: 5.0000 mg | INTRAMUSCULAR | Status: DC | PRN

## 2021-04-02 MED ORDER — HEPARIN SODIUM (PORCINE) 1000 UNIT/ML IJ SOLN
INTRAMUSCULAR | Status: AC
  Filled 2021-04-02: qty 10

## 2021-04-02 MED ORDER — CLOPIDOGREL BISULFATE 75 MG PO TABS: 75.0000 mg | ORAL_TABLET | Freq: Every day | ORAL | Status: DC

## 2021-04-02 MED ORDER — MIDAZOLAM HCL 2 MG/2ML IJ SOLN
INTRAMUSCULAR | Status: DC | PRN
  Administered 2021-04-02: 1 mg via INTRAVENOUS
  Administered 2021-04-02: 2 mg via INTRAVENOUS
  Administered 2021-04-02: .5 mg via INTRAVENOUS

## 2021-04-02 MED ORDER — HYDROCODONE-ACETAMINOPHEN 5-325 MG PO TABS
1.0000 | ORAL_TABLET | Freq: Once | ORAL | Status: AC
  Administered 2021-04-02: 1 via ORAL

## 2021-04-02 MED ORDER — CLOPIDOGREL BISULFATE 75 MG PO TABS: 75.0000 mg | ORAL_TABLET | Freq: Every day | ORAL | 11 refills | Status: DC

## 2021-04-02 MED ORDER — ASPIRIN EC 81 MG PO TBEC
81.0000 mg | DELAYED_RELEASE_TABLET | Freq: Every day | ORAL | Status: DC
  Administered 2021-04-02: 81 mg via ORAL

## 2021-04-02 MED ORDER — IODIXANOL 320 MG/ML IV SOLN
INTRAVENOUS | Status: DC | PRN
  Administered 2021-04-02: 45 mL via INTRA_ARTERIAL

## 2021-04-02 MED ORDER — SODIUM CHLORIDE 0.9 % IV SOLN: INTRAVENOUS | Status: DC

## 2021-04-02 MED ORDER — FENTANYL CITRATE PF 50 MCG/ML IJ SOSY
PREFILLED_SYRINGE | INTRAMUSCULAR | Status: AC
  Filled 2021-04-02: qty 1

## 2021-04-02 MED ORDER — ONDANSETRON HCL 4 MG/2ML IJ SOLN: 4.0000 mg | Freq: Four times a day (QID) | INTRAMUSCULAR | Status: DC | PRN

## 2021-04-02 MED ORDER — MIDAZOLAM HCL 2 MG/ML PO SYRP: 8.0000 mg | ORAL_SOLUTION | Freq: Once | ORAL | Status: DC | PRN

## 2021-04-02 MED ORDER — SODIUM CHLORIDE 0.9% FLUSH: 3.0000 mL | INTRAVENOUS | Status: DC | PRN

## 2021-04-02 MED ORDER — CLINDAMYCIN PHOSPHATE 300 MG/50ML IV SOLN
INTRAVENOUS | Status: AC
  Filled 2021-04-02: qty 50

## 2021-04-02 MED ORDER — SODIUM CHLORIDE 0.9 % IV SOLN: 250.0000 mL | INTRAVENOUS | Status: DC | PRN

## 2021-04-02 MED ORDER — ATORVASTATIN CALCIUM 20 MG PO TABS
10.0000 mg | ORAL_TABLET | Freq: Every day | ORAL | Status: DC
  Filled 2021-04-02: qty 0.5

## 2021-04-02 MED ORDER — METHYLPREDNISOLONE SODIUM SUCC 125 MG IJ SOLR: 125.0000 mg | Freq: Once | INTRAMUSCULAR | Status: DC | PRN

## 2021-04-02 MED ORDER — HEPARIN SODIUM (PORCINE) 1000 UNIT/ML IJ SOLN
INTRAMUSCULAR | Status: DC | PRN
  Administered 2021-04-02: 5000 [IU] via INTRAVENOUS

## 2021-04-02 MED ORDER — ASPIRIN EC 81 MG PO TBEC
DELAYED_RELEASE_TABLET | ORAL | Status: AC
  Filled 2021-04-02: qty 1

## 2021-04-02 MED ORDER — ACETAMINOPHEN 325 MG PO TABS: 650.0000 mg | ORAL_TABLET | ORAL | Status: DC | PRN

## 2021-04-02 MED ORDER — DIPHENHYDRAMINE HCL 50 MG/ML IJ SOLN: 50.0000 mg | Freq: Once | INTRAMUSCULAR | Status: DC | PRN

## 2021-04-02 SURGICAL SUPPLY — 17 items
BALLN ULTRVRSE  6X40X75C (BALLOONS) ×1
BALLN ULTRVRSE 6X40X75C (BALLOONS) ×1
BALLOON ULTRVRSE 6X40X75C (BALLOONS) IMPLANT
CATH ANGIO 5F PIGTAIL 65CM (CATHETERS) ×1 IMPLANT
COVER PROBE U/S 5X48 (MISCELLANEOUS) ×1 IMPLANT
DEVICE STARCLOSE SE CLOSURE (Vascular Products) ×2 IMPLANT
GAUZE SPONGE 4X4 12PLY STRL (GAUZE/BANDAGES/DRESSINGS) ×3 IMPLANT
GLIDESHEATH SLENDER 7FR .021G (SHEATH) ×1 IMPLANT
GLIDEWIRE ADV .035X180CM (WIRE) ×1 IMPLANT
KIT ENCORE 26 ADVANTAGE (KITS) ×2 IMPLANT
PACK ANGIOGRAPHY (CUSTOM PROCEDURE TRAY) ×2 IMPLANT
SHEATH BRITE TIP 5FRX11 (SHEATH) ×1 IMPLANT
STENT LIFESTREAM 7X58X80 (Permanent Stent) ×1 IMPLANT
SYR MEDRAD MARK 7 150ML (SYRINGE) ×1 IMPLANT
TUBING CONTRAST HIGH PRESS 72 (TUBING) ×1 IMPLANT
WIRE GUIDERIGHT .035X150 (WIRE) ×1 IMPLANT
WIRE MAGIC TOR.035 180C (WIRE) ×1 IMPLANT

## 2021-04-02 NOTE — Op Note (Signed)
[facility]  Percutaneous Study/Intervention Procedural Note   Date of Surgery: 04/02/2021  Surgeon(s):Dreanna Kyllo    Assistants:none  Pre-operative Diagnosis: PAD with disabling claudication left lower extremity  Post-operative diagnosis:  Same  Procedure(s) Performed:             1.  Ultrasound guidance for vascular access bilateral femoral arteries             2.  Catheter placement into aorta from bilateral femoral approaches             3.  Aortogram and bilateral iliofemoral angiograms             4.  Percutaneous transluminal angioplasty of right common iliac artery with 6 mm diameter angioplasty balloon             5.  Stent placement to the left common iliac artery with 7 mm diameter by 58 mm Lifestream stent  6.  StarClose closure device bilateral femoral arteries  EBL: 10 cc  Contrast: 45 cc  Fluoro Time: 1.9 minutes  Moderate Conscious Sedation Time: approximately 25 minutes using 3.5 mg of Versed and 100 mcg of Fentanyl              Indications:  Patient is a 66 y.o.female with disabling claudication symptoms of the left leg. The patient has noninvasive study showing suspected aortoiliac disease with reduced left leg perfusion. The patient is brought in for angiography for further evaluation and potential treatment. Risks and benefits are discussed and informed consent is obtained.   Procedure:  The patient was identified and appropriate procedural time out was performed.  The patient was then placed supine on the table and prepped and draped in the usual sterile fashion. Moderate conscious sedation was administered during a face to face encounter with the patient throughout the procedure with my supervision of the RN administering medicines and monitoring the patient's vital signs, pulse oximetry, telemetry and mental status throughout from the start of the procedure until the patient was taken to the recovery room. Ultrasound was used to evaluate  the right common femoral artery.  It was patent .  A digital ultrasound image was acquired.  A Seldinger needle was used to access the right common femoral artery under direct ultrasound guidance and a permanent image was performed.  A 0.035 J wire was advanced without resistance and a 5Fr sheath was placed.  Pigtail catheter was placed into the aorta and an AP aortogram was performed.  I then pulled down to the aortic bifurcation and performed pelvic obliques.  This demonstrated normal renal arteries and normal aorta down to the distal aorta where began to develop disease.  The right common iliac artery was only mildly diseased but had some significant calcification with a high-grade string sign stenosis of the left common iliac artery and no left hypogastric artery.  Both external iliac arteries were fairly normal and we tracked down to both common femoral artery and femoral bifurcations.  Both femoral bifurcations were calcific but not highly stenotic.  I then elected to access the left femoral artery and this was done under direct ultrasound guidance without difficulty with a Seldinger needle.  A wire was placed and a 7 French slender sheath was then placed over the wire.  The patient was then systemically heparinized.  I crossed the lesion in the left common iliac artery without difficulty with an advantage wire parking the wire and catheter in the aorta.  A Magic torque  wire was used to replace the pigtail catheter on the right side.  To protect the right iliac artery as well as to treat the mild disease in the right common iliac artery, 6 mm diameter by 4 cm length balloon was selected.  On the left side, 7 mm diameter by 58 mm length lifestream stent was selected.  This was deployed from the origin of the left common iliac artery down across the lesion into what was likely the proximal external iliac artery as the hypogastric artery was occluded.  Simultaneously, the 6 mm balloon was inflated to 12 atm on  the right side.  Completion imaging showed excellent flow with less than 10% residual stenosis of both common iliac arteries.  I elected to terminate the procedure. The sheath was removed and StarClose closure device was deployed in the left femoral artery with excellent hemostatic result.  Similarly, StarClose closure device was deployed in the right femoral artery with excellent hemostatic result.  The patient was taken to the recovery room in stable condition having tolerated the procedure well.  Findings:               Aortogram:  This demonstrated normal renal arteries and normal aorta down to the distal aorta where began to develop disease.  The right common iliac artery was only mildly diseased but had some significant calcification with a high-grade string sign stenosis of the left common iliac artery and no left hypogastric artery.  Both external iliac arteries were fairly normal and we tracked down to both common femoral artery and femoral bifurcations.  Both femoral bifurcations were calcific but not highly stenotic.                 Disposition: Patient was taken to the recovery room in stable condition having tolerated the procedure well.  Complications: None  Patricia Decker 04/02/2021 2:10 PM   This note was created with Dragon Medical transcription system. Any errors in dictation are purely unintentional.

## 2021-04-05 ENCOUNTER — Encounter: Payer: Self-pay | Admitting: Vascular Surgery

## 2021-04-05 NOTE — Interval H&P Note (Signed)
History and Physical Interval Note:  04/05/2021 1:18 PM  Patricia Decker  has presented today for surgery, with the diagnosis of LLE Angio  BARD   ASO w claudication.  The various methods of treatment have been discussed with the patient and family. After consideration of risks, benefits and other options for treatment, the patient has consented to  Procedure(s): LOWER EXTREMITY ANGIOGRAPHY (Left) as a surgical intervention.  The patient's history has been reviewed, patient examined, no change in status, stable for surgery.  I have reviewed the patient's chart and labs.  Questions were answered to the patient's satisfaction.     [name]

## 2021-04-07 ENCOUNTER — Encounter: Payer: Self-pay | Admitting: Vascular Surgery

## 2021-04-12 ENCOUNTER — Other Ambulatory Visit: Payer: Self-pay | Admitting: Family Medicine

## 2021-04-12 DIAGNOSIS — L309 Dermatitis, unspecified: Secondary | ICD-10-CM | POA: Diagnosis not present

## 2021-04-12 DIAGNOSIS — L308 Other specified dermatitis: Secondary | ICD-10-CM | POA: Diagnosis not present

## 2021-04-12 DIAGNOSIS — F411 Generalized anxiety disorder: Secondary | ICD-10-CM

## 2021-04-16 ENCOUNTER — Ambulatory Visit: Payer: Medicare Other | Admitting: Family Medicine

## 2021-04-23 ENCOUNTER — Other Ambulatory Visit: Payer: Self-pay

## 2021-04-23 ENCOUNTER — Telehealth: Payer: Self-pay

## 2021-04-23 ENCOUNTER — Ambulatory Visit
Admission: RE | Admit: 2021-04-23 | Discharge: 2021-04-23 | Disposition: A | Discharge status: Home / Self Care | Payer: Medicare Other | Source: Ambulatory Visit | Attending: Family Medicine | Admitting: Family Medicine

## 2021-04-23 DIAGNOSIS — Z1231 Encounter for screening mammogram for malignant neoplasm of breast: Secondary | ICD-10-CM | POA: Insufficient documentation

## 2021-04-23 NOTE — Telephone Encounter (Signed)
Pt called for lab results but disconnected before speaking with NT, I called pt back but no answer and unable to leave VM.

## 2021-04-26 ENCOUNTER — Other Ambulatory Visit: Payer: Self-pay | Admitting: Family Medicine

## 2021-04-26 DIAGNOSIS — M1990 Unspecified osteoarthritis, unspecified site: Secondary | ICD-10-CM

## 2021-04-26 DIAGNOSIS — M5116 Intervertebral disc disorders with radiculopathy, lumbar region: Secondary | ICD-10-CM

## 2021-04-26 NOTE — Telephone Encounter (Signed)
Copied from [place] [phone]. Topic: Quick Communication - Rx Refill/Question >> Apr 26, 2021  3:08 PM Tessa Lerner A wrote: Medication: HYDROcodone-acetaminophen ([place]) 10-325 MG tablet [[id number]]   Has the patient contacted their pharmacy? No. (Agent: If no, request that the patient contact the pharmacy for the refill. If patient does not wish to contact the pharmacy document the reason why and proceed with request.) (Agent: If yes, when and what did the pharmacy advise?)  Preferred Pharmacy (with phone number or street name): [facility] [place], [place] [address] Phone: [phone] Fax: [phone] Hours: Not open 24 hours  Has the patient been seen for an appointment in the last year OR does the patient have an upcoming appointment? Yes.    Agent: Please be advised that RX refills may take up to 3 business days. We ask that you follow-up with your pharmacy.

## 2021-04-27 MED ORDER — HYDROCODONE-ACETAMINOPHEN 10-325 MG PO TABS: 1.0000 | ORAL_TABLET | Freq: Three times a day (TID) | ORAL | 0 refills | Status: DC | PRN

## 2021-04-27 NOTE — Telephone Encounter (Signed)
Requested medication (s) are due for refill today: yes  Requested medication (s) are on the active medication list: yes  Last refill:  02/10/21 #60 with 0 RF  Future visit scheduled: 05/14/21  Notes to clinic:  This medication can not be delegated, please assess.        Requested Prescriptions  Pending Prescriptions Disp Refills   HYDROcodone-acetaminophen (NOR[place]) 10-325 MG tablet 60 tablet 0    Sig: Take 1 tablet by mouth every 8 (eight) hours as needed for moderate pain.     Not Delegated - Analgesics:  Opioid Agonist Combinations Failed - 04/27/2021 10:45 AM      Failed - This refill cannot be delegated      Failed - Urine Drug Screen completed in last 360 days      Passed - Valid encounter within last 3 months    Recent Outpatient Visits           1 month ago Acute recurrent pansinusitis   [facility] Tally Joe T, FNP   3 months ago PAD (peripheral artery disease) [facility])   [facility] Tally Joe T, FNP   6 months ago Neuritis or radiculitis due to rupture of lumbar intervertebral disc   [facility] Birdie Sons, MD   1 year ago Recurrent major depressive disorder, in partial remission [facility])   [facility], [place], [place]   2 years ago Sinus pressure   [facility] [place], Dionne Bucy, MD

## 2021-04-30 ENCOUNTER — Other Ambulatory Visit (INDEPENDENT_AMBULATORY_CARE_PROVIDER_SITE_OTHER): Payer: Self-pay | Admitting: Vascular Surgery

## 2021-04-30 ENCOUNTER — Encounter (INDEPENDENT_AMBULATORY_CARE_PROVIDER_SITE_OTHER): Payer: Self-pay | Admitting: Nurse Practitioner

## 2021-04-30 ENCOUNTER — Other Ambulatory Visit: Payer: Self-pay

## 2021-04-30 ENCOUNTER — Ambulatory Visit (INDEPENDENT_AMBULATORY_CARE_PROVIDER_SITE_OTHER): Payer: Medicare Other | Admitting: Nurse Practitioner

## 2021-04-30 ENCOUNTER — Ambulatory Visit (INDEPENDENT_AMBULATORY_CARE_PROVIDER_SITE_OTHER): Payer: Medicare Other

## 2021-04-30 VITALS — BP 138/82 | HR 61 | Resp 16 | Wt 138.0 lb

## 2021-04-30 DIAGNOSIS — I70212 Atherosclerosis of native arteries of extremities with intermittent claudication, left leg: Secondary | ICD-10-CM

## 2021-04-30 DIAGNOSIS — M5442 Lumbago with sciatica, left side: Secondary | ICD-10-CM

## 2021-04-30 DIAGNOSIS — E7800 Pure hypercholesterolemia, unspecified: Secondary | ICD-10-CM

## 2021-04-30 DIAGNOSIS — Z95820 Peripheral vascular angioplasty status with implants and grafts: Secondary | ICD-10-CM

## 2021-04-30 DIAGNOSIS — I739 Peripheral vascular disease, unspecified: Secondary | ICD-10-CM

## 2021-04-30 NOTE — Progress Notes (Signed)
Subjective:    Patient ID: Patricia Decker, female    DOB: 12/02/1955, 66 y.o.   MRN: [id number] Chief Complaint  Patient presents with   Follow-up    [facility] 4 week with ABI    Patricia Decker is a 66 year old female that presents today following intervention on 04/02/2020 including:  Procedure(s) Performed:             1.  Ultrasound guidance for vascular access bilateral femoral arteries             2.  Catheter placement into aorta from bilateral femoral approaches             3.  Aortogram and bilateral iliofemoral angiograms             4.  Percutaneous transluminal angioplasty of right common iliac artery with 6 mm diameter angioplasty balloon             5.  Stent placement to the left common iliac artery with 7 mm diameter by 58 mm Lifestream stent             6.  StarClose closure device bilateral femoral arteries  Initially the patient presented as a referral for left lower extremity claudication that begins in her left hip.  The patient also had substantial history of lower back issues as well as left-sided sciatica.  She has had multiple interventions including physical therapy, cortisone shots and nothing has helped.  The patient notes that today following intervention the pain is essentially gone.  She can walk for much longer distances without issue such as weakness or cramping or pain.  She denies any rest pain like symptoms or any open wounds or ulcerations.  Today noninvasive studies show an ABI 1.24 on the right, with previous being 1.08.  The left has an ABI of 1.39 with previous being 0.79.  The patient has biphasic/monophasic waveforms in the right tibial arteries with good toe waveforms whereas last has triphasic waveforms with good toe waveforms.   Review of Systems  Hematological:  Bruises/bleeds easily.  All other systems reviewed and are negative.     Objective:   Physical Exam Vitals reviewed.  HENT:     Head: Normocephalic.  Cardiovascular:     Rate and  Rhythm: Normal rate.     Pulses: Normal pulses.  Pulmonary:     Effort: Pulmonary effort is normal.  Skin:    General: Skin is warm and dry.  Neurological:     Mental Status: She is alert and oriented to person, place, and time.  Psychiatric:        Mood and Affect: Mood normal.        Behavior: Behavior normal.        Thought Content: Thought content normal.        Judgment: Judgment normal.    BP 138/82 (BP Location: Right Arm)    Pulse 61    Resp 16    Wt 138 lb (62.6 kg)    BMI 22.96 kg/m   Past Medical History:  Diagnosis Date   Anemia    Anxiety    Arthritis    Asthma    Cataract    removed   Depression    Wears dentures    Full upper, partial lower    Social History   Socioeconomic History   Marital status: Widowed    Spouse name: Not on file   Number of children: 1   Years of education:  Not on file   Highest education level: Some college, no degree  Occupational History   Occupation: disability  Tobacco Use   Smoking status: Never   Smokeless tobacco: Never  Vaping Use   Vaping Use: Never used  Substance and Sexual Activity   Alcohol use: No   Drug use: No   Sexual activity: Not on file  Other Topics Concern   Not on file  Social History Narrative   Not on file   Social Determinants of Health   Financial Resource Strain: Not on file  Food Insecurity: Not on file  Transportation Needs: Not on file  Physical Activity: Not on file  Stress: Not on file  Social Connections: Not on file  Intimate Partner Violence: Not on file    Past Surgical History:  Procedure Laterality Date   ABDOMINAL HYSTERECTOMY     APPENDECTOMY     CATARACT EXTRACTION, BILATERAL     CHOLECYSTECTOMY     COLONOSCOPY WITH PROPOFOL N/A 06/30/2016   Procedure: COLONOSCOPY WITH PROPOFOL;  Surgeon: Jonathon Bellows, MD;  Location: [facility] END[facility]COPY;  Service: Endoscopy;  Laterality: N/A;   EAR CYST EXCISION Left 07/31/2019   Procedure: EXCISION EAR LESION;  Surgeon: Carloyn Manner, MD;  Location: [place];  Service: ENT;  Laterality: Left;  Latex   LOWER EXTREMITY ANGIOGRAPHY Left 04/02/2021   Procedure: LOWER EXTREMITY ANGIOGRAPHY;  Surgeon: Algernon Huxley, MD;  Location: [place] [facility];  Service: Cardiovascular;  Laterality: Left;    Family History  Problem Relation Age of Onset   Alzheimer's disease Sister    Breast cancer Sister 30    Allergies  Allergen Reactions   Penicillins    Sulfa Antibiotics     Indigestion   Latex Rash    Gloves irritate hands when worn    CBC Latest Ref Rng & Units 05/06/2020 05/08/2019 04/10/2019  WBC 3.4 - 10.8 x10E3/uL 7.4 6.6 5.2  Hemoglobin 11.1 - 15.9 g/dL 11.5 10.7(L) 10.5(L)  Hematocrit 34.0 - 46.6 % 35.9 33.2(L) 31.8(L)  Platelets 150 - 450 x10E3/uL 430 308 310      CMP     Component Value Date/Time   NA 140 05/06/2020 1419   K 4.5 05/06/2020 1419   CL 103 05/06/2020 1419   CO2 20 05/06/2020 1419   GLUCOSE 98 05/06/2020 1419   BUN 13 04/02/2021 1142   BUN 14 05/06/2020 1419   CREATININE 0.69 04/02/2021 1142   CALCIUM 9.7 05/06/2020 1419   PROT 7.5 05/06/2020 1419   ALBUMIN 4.6 05/06/2020 1419   AST 21 05/06/2020 1419   ALT 10 05/06/2020 1419   ALKPHOS 74 05/06/2020 1419   BILITOT <0.2 05/06/2020 1419   GFRNONAA >60 04/02/2021 1142   GFRAA 84 03/21/2019 0931     VAS [place] ABI WITH/WO TBI  Result Date: 03/05/2021  LOWER EXTREMITY DOPPLER STUDY Patient Name:  Patricia Decker  Date of Exam:   03/03/2021 Medical Rec #: [id number]         Accession #:    [id number] Date of Birth: [birth year]          Patient Gender: F Patient Age:   42 years Exam Location:  [facility] Procedure:      VAS [place] ABI WITH/WO TBI Referring Phys: --------------------------------------------------------------------------------  Indications: Left leg claudication starts in lt hip.  Performing Technologist: Concha Norway RVT  Examination Guidelines: A complete evaluation includes at minimum, Doppler waveform  signals and systolic blood pressure reading at the  level of bilateral brachial, anterior tibial, and posterior tibial arteries, when vessel segments are accessible. Bilateral testing is considered an integral part of a complete examination. Photoelectric Plethysmograph (PPG) waveforms and toe systolic pressure readings are included as required and additional duplex testing as needed. Limited examinations for reoccurring indications may be performed as noted.  ABI Findings: +---------+------------------+-----+---------+--------+  Right     Rt Pressure (mmHg) Index Waveform  Comment   +---------+------------------+-----+---------+--------+  Brachial  150                                          +---------+------------------+-----+---------+--------+  ATA       155                      triphasic 1.03      +---------+------------------+-----+---------+--------+  PTA       162                1.08  triphasic           +---------+------------------+-----+---------+--------+  Great Toe 128                0.85  Normal              +---------+------------------+-----+---------+--------+ +---------+------------------+-----+----------+-------+  Left      Lt Pressure (mmHg) Index Waveform   Comment  +---------+------------------+-----+----------+-------+  Brachial  150                                          +---------+------------------+-----+----------+-------+  ATA       118                      monophasic .79      +---------+------------------+-----+----------+-------+  PTA       110                0.73  monophasic          +---------+------------------+-----+----------+-------+  Great Toe 77                 0.51  Abnormal            +---------+------------------+-----+----------+-------+  Summary: Right: Resting right ankle-brachial index is within normal range. No evidence of significant right lower extremity arterial disease. The right toe-brachial index is normal. Left: Resting left ankle-brachial index indicates  moderate left lower extremity arterial disease. The left toe-brachial index is abnormal.  *See table(s) above for measurements and observations.  Electronically signed by [name] MD on 03/05/2021 at 10:02:50 AM.    Final        Assessment & Plan:   1. PAD (peripheral artery disease) (HCC)  Recommend:  The patient has evidence of atherosclerosis of the lower extremities with little claudication.  The patient does not voice lifestyle limiting changes at this point in time.  Noninvasive studies do not suggest clinically significant change.  No invasive studies, angiography or surgery at this time The patient should continue walking and begin a more formal exercise program.  The patient should continue antiplatelet therapy and aggressive treatment of the lipid abnormalities  No changes in the patient's medications at this time  The patient should continue wearing graduated compression socks 10-15 mmHg strength to control the mild edema.    2.  Hypercholesterolemia Continue statin as ordered and reviewed, no changes at this time   3. Left-sided low back pain with left-sided sciatica, unspecified chronicity The patient essentially does not have significant sciatica any longer   Current Outpatient Medications on File Prior to Visit  Medication Sig Dispense Refill   ALPRAZolam (XANAX) 1 MG tablet TAKE 1/2 TO 1 TABLET BY MOUTH TWICE DAILY AS NEEDED FOR ANXIETY 60 tablet 5   aspirin EC 81 MG tablet Take 1 tablet (81 mg total) by mouth daily. 150 tablet 2   atorvastatin (LIPITOR) 10 MG tablet Take 1 tablet (10 mg total) by mouth daily. 30 tablet 11   clopidogrel (PLAVIX) 75 MG tablet Take 1 tablet (75 mg total) by mouth daily. 30 tablet 11   HYDROcodone-acetaminophen (NORCO) 10-325 MG tablet Take 1 tablet by mouth every 8 (eight) hours as needed for moderate pain. 60 tablet 0   ARIPIPRAZOLE PO Take 15 mg by mouth daily. (Patient not taking: Reported on 04/02/2021)     augmented betamethasone  dipropionate (DIPROLENE-AF) 0.05 % cream Apply topically as needed. (Patient not taking: Reported on 04/02/2021)     diclofenac (VOLTAREN) 75 MG EC tablet Take 75 mg by mouth 2 (two) times daily. (Patient not taking: Reported on 04/02/2021)     Ferrous Sulfate (IRON PO) Take by mouth 2 (two) times a week. (Patient not taking: Reported on 04/02/2021)     gabapentin (NEURONTIN) 300 MG capsule Take 1 capsule (300 mg total) by mouth 3 (three) times daily. (Patient not taking: Reported on 04/02/2021) 90 capsule 3   hydrOXYzine (VISTARIL) 50 MG capsule Take 50 mg by mouth as needed (for itching). (Patient not taking: Reported on 04/02/2021)     ondansetron (ZOFRAN) 4 MG tablet Take 1 tablet (4 mg total) by mouth every 8 (eight) hours as needed for up to 10 doses for nausea or vomiting. (Patient not taking: Reported on 04/02/2021) 20 tablet 0   No current facility-administered medications on file prior to visit.    There are no Patient Instructions on file for this visit. No follow-ups on file.   Kris Hartmann, NP

## 2021-05-10 DIAGNOSIS — L308 Other specified dermatitis: Secondary | ICD-10-CM | POA: Diagnosis not present

## 2021-05-14 ENCOUNTER — Encounter: Payer: Medicare Other | Admitting: Family Medicine

## 2021-06-25 ENCOUNTER — Encounter: Payer: Self-pay | Admitting: Family Medicine

## 2021-06-25 ENCOUNTER — Other Ambulatory Visit: Payer: Self-pay | Admitting: Family Medicine

## 2021-06-25 ENCOUNTER — Ambulatory Visit (INDEPENDENT_AMBULATORY_CARE_PROVIDER_SITE_OTHER): Payer: Medicare Other | Admitting: Family Medicine

## 2021-06-25 VITALS — BP 136/76 | HR 67 | Temp 99.0°F | Resp 16 | Ht 65.0 in | Wt 134.2 lb

## 2021-06-25 DIAGNOSIS — M545 Low back pain, unspecified: Secondary | ICD-10-CM | POA: Insufficient documentation

## 2021-06-25 DIAGNOSIS — G8929 Other chronic pain: Secondary | ICD-10-CM | POA: Diagnosis not present

## 2021-06-25 DIAGNOSIS — J301 Allergic rhinitis due to pollen: Secondary | ICD-10-CM | POA: Insufficient documentation

## 2021-06-25 DIAGNOSIS — I739 Peripheral vascular disease, unspecified: Secondary | ICD-10-CM | POA: Diagnosis not present

## 2021-06-25 DIAGNOSIS — F410 Panic disorder [episodic paroxysmal anxiety] without agoraphobia: Secondary | ICD-10-CM | POA: Diagnosis not present

## 2021-06-25 MED ORDER — FLUTICASONE PROPIONATE 50 MCG/ACT NA SUSP: 2.0000 | Freq: Every day | NASAL | 6 refills | Status: DC

## 2021-06-25 NOTE — Assessment & Plan Note (Addendum)
Chronic, improved ?Has been seen for stenting with angio by [facility] ?Doing well on plavix ?Reports that she is very grateful that the answer to her problem of >5 years has been found ?Continue to follow up with [facility]/vascular team; continue plavix '75mg'$  QD ? ?

## 2021-06-25 NOTE — Assessment & Plan Note (Addendum)
Chronic, stable ?Continue low dose benzo, Xanax 1/2-1 mg tablet, up to two times/day previously prescribed by Dr Caryn Section to assist ?Recommend use of daily medication if increased need for PRN medication ?

## 2021-06-25 NOTE — Progress Notes (Signed)
?  ? ?I,Patricia Decker,acting as a scribe for Gwyneth Sprout, FNP.,have documented all relevant documentation on the behalf of Gwyneth Sprout, FNP,as directed by  Gwyneth Sprout, FNP while in the presence of Gwyneth Sprout, FNP. ? ? ?Established patient visit ? ? ?Patient: Patricia Decker   DOB: [birth year]   66 y.o. Female  MRN: [id number] ?Visit Date: 06/25/2021 ? ?Today's healthcare provider: Gwyneth Sprout, FNP  ?Re Introduced to nurse practitioner role and practice setting.  All questions answered.  Discussed provider/patient relationship and expectations. ? ? ?Chief Complaint  ?Patient presents with  ? Anxiety  ? Back Pain  ? ?Subjective  ?  ?HPI  ?Anxiety, Follow-up ? ?She was last seen for anxiety 1 years ago. ?Changes made at last visit include no changes. ?  ?She reports excellent compliance with treatment. Patient reports taking Xanax once or twice a week. Patient reports breaking out in rash with anxiety. ?She reports excellent tolerance of treatment. ?She is not having side effects.  ? ?She feels her anxiety is mild and Unchanged since last visit. ? ?Symptoms: ?No chest pain No difficulty concentrating  ?No dizziness No fatigue  ?No feelings of losing control No insomnia  ?No irritable No palpitations  ?No panic attacks No racing thoughts  ?No shortness of breath No sweating  ?No tremors/shakes   ? ?GAD-7 Results ?   ? View : No data to display.  ?  ?  ?  ? ? ?PHQ-9 Scores ? ?  05/06/2020  ?  1:33 PM 11/20/2019  ?  9:29 AM 07/10/2019  ?  9:32 AM  ?PHQ9 SCORE ONLY  ?PHQ-9 Total Score 0 2 1  ? ? ?--------------------------------------------------------------------------------------------------- ?Follow up for back pain ? ?The patient was last seen for this 5 months ago. ?Changes made at last visit include no changes. ? ?She reports excellent compliance with treatment. Patient reports taking Norco about once a week and usually takes half a tablet daily. ?She feels that condition is Unchanged. ?She is not having side  effects.  ? ?----------------------------------------------------------------------------------------- ? ? ?Medications: ?Outpatient Medications Prior to Visit  ?Medication Sig  ? ALPRAZolam (XANAX) 1 MG tablet TAKE 1/2 TO 1 TABLET BY MOUTH TWICE DAILY AS NEEDED FOR ANXIETY  ? aspirin EC 81 MG tablet Take 1 tablet (81 mg total) by mouth daily.  ? atorvastatin (LIPITOR) 10 MG tablet Take 1 tablet (10 mg total) by mouth daily.  ? clopidogrel (PLAVIX) 75 MG tablet Take 1 tablet (75 mg total) by mouth daily.  ? HYDROcodone-acetaminophen (NORCO) 10-325 MG tablet Take 1 tablet by mouth every 8 (eight) hours as needed for moderate pain.  ? [DISCONTINUED] Ferrous Sulfate (IRON PO) Take by mouth 2 (two) times a week. (Patient not taking: Reported on 04/02/2021)  ? ?No facility-administered medications prior to visit.  ? ? ?Review of Systems  ?Constitutional:  Negative for appetite change, chills and fever.  ?HENT:  Positive for congestion and rhinorrhea. Negative for ear pain, sinus pressure, sinus pain and sore throat.   ?Respiratory:  Positive for cough. Negative for shortness of breath.   ?Cardiovascular:  Negative for chest pain and palpitations.  ?Musculoskeletal:  Positive for back pain and myalgias.  ? ? ?  Objective  ?  ?BP 136/76 (BP Location: Left Arm, Patient Position: Sitting, Cuff Size: Normal)   Pulse 67   Temp 99 ?F (37.2 ?C) (Oral)   Resp 16   Ht '5\' 5"'$  (1.651 m)   Wt 134 lb  3.2 oz (60.9 kg)   SpO2 99%   BMI 22.33 kg/m?  ?BP Readings from Last 3 Encounters:  ?06/25/21 136/76  ?04/30/21 138/82  ?04/02/21 (!) 174/77  ? ?Wt Readings from Last 3 Encounters:  ?06/25/21 134 lb 3.2 oz (60.9 kg)  ?04/30/21 138 lb (62.6 kg)  ?04/02/21 139 lb (63 kg)  ? ?  ? ?Physical Exam ?Vitals and nursing note reviewed.  ?Constitutional:   ?   General: She is not in acute distress. ?   Appearance: Normal appearance. She is normal weight. She is not ill-appearing, toxic-appearing or diaphoretic.  ?HENT:  ?   Head: Normocephalic  and atraumatic.  ?Cardiovascular:  ?   Rate and Rhythm: Normal rate and regular rhythm.  ?   Pulses: Normal pulses.  ?   Heart sounds: Normal heart sounds. No murmur heard. ?  No friction rub. No gallop.  ?Pulmonary:  ?   Effort: Pulmonary effort is normal. No respiratory distress.  ?   Breath sounds: Normal breath sounds. No stridor. No wheezing, rhonchi or rales.  ?Chest:  ?   Chest wall: No tenderness.  ?Abdominal:  ?   General: Bowel sounds are normal.  ?   Palpations: Abdomen is soft.  ?Musculoskeletal:     ?   General: No swelling, tenderness, deformity or signs of injury. Normal range of motion.  ?   Right lower leg: No edema.  ?   Left lower leg: No edema.  ?Skin: ?   General: Skin is warm and dry.  ?   Capillary Refill: Capillary refill takes less than 2 seconds.  ?   Coloration: Skin is not jaundiced or pale.  ?   Findings: No bruising, erythema, lesion or rash.  ?Neurological:  ?   General: No focal deficit present.  ?   Mental Status: She is alert and oriented to person, place, and time. Mental status is at baseline.  ?   Cranial Nerves: No cranial nerve deficit.  ?   Sensory: No sensory deficit.  ?   Motor: No weakness.  ?   Coordination: Coordination normal.  ?Psychiatric:     ?   Mood and Affect: Mood normal.     ?   Behavior: Behavior normal.     ?   Thought Content: Thought content normal.     ?   Judgment: Judgment normal.  ?  ? ?No results found for any visits on 06/25/21. ? Assessment & Plan  ?  ? ?Problem List Items Addressed This Visit   ? ?  ? Cardiovascular and Mediastinum  ? PAD (peripheral artery disease) ([place])  ?  Chronic, improved ?Has been seen for stenting with angio by [facility] ?Doing well on plavix ?Reports that she is very grateful that the answer to her problem of >5 years has been found ?Continue to follow up with [facility]/vascular team; continue plavix '75mg'$  QD ? ? ?  ?  ?  ? Respiratory  ? Seasonal allergic rhinitis due to pollen - Primary  ?  Acute, c/o R ear popping  ?Recommend  use of intranasal steroid as acute, uncomplicated/stable ?RTC in 2 weeks if symptoms worsen ? ?  ?  ? Relevant Medications  ? fluticasone (FLONASE) 50 MCG/ACT nasal spray  ?  ? Other  ? Chronic bilateral low back pain without sciatica  ?  Chronic, stable ?Occ use of opioid to assist- norco 10/325 mg TID PRN ?Continue to recommend APAP, 1000 mg TID PRN, and walking/stretching for further  assistance ? ?  ?  ? Panic attacks  ?  Chronic, stable ?Continue low dose benzo, Xanax 1/2-1 mg tablet, up to two times/day previously prescribed by Dr Caryn Section to assist ?Recommend use of daily medication if increased need for PRN medication ? ?  ?  ? ?Return in about 3 months (around 09/24/2021) for PAP.  ?   ? ?I, Gwyneth Sprout, FNP, have reviewed all documentation for this visit. The documentation on 06/25/21 for the exam, diagnosis, procedures, and orders are all accurate and complete. ? ?Gwyneth Sprout, FNP  ?[place] ?[phone] (phone) ?[phone] (fax) ? ?[facility] ?

## 2021-06-25 NOTE — Assessment & Plan Note (Addendum)
Chronic, stable ?Occ use of opioid to assist- norco 10/325 mg TID PRN ?Continue to recommend APAP, 1000 mg TID PRN, and walking/stretching for further assistance ?

## 2021-06-25 NOTE — Assessment & Plan Note (Addendum)
Acute, c/o R ear popping  ?Recommend use of intranasal steroid as acute, uncomplicated/stable ?RTC in 2 weeks if symptoms worsen ?

## 2021-06-28 ENCOUNTER — Ambulatory Visit (INDEPENDENT_AMBULATORY_CARE_PROVIDER_SITE_OTHER): Payer: Medicare Other

## 2021-06-28 VITALS — Wt 134.0 lb

## 2021-06-28 DIAGNOSIS — Z0000 Encounter for general adult medical examination without abnormal findings: Secondary | ICD-10-CM

## 2021-06-28 NOTE — Progress Notes (Signed)
?Virtual Visit via Telephone Note ? ?I connected with  Patricia Decker on 06/28/21 at 10:30 AM EDT by telephone and verified that I am speaking with the correct person using two identifiers. ? ?Location: ?Patient: home ?Provider: [facility] ?Persons participating in the virtual visit: patient/Nurse Health Advisor ?  ?I discussed the limitations, risks, security and privacy concerns of performing an evaluation and management service by telephone and the availability of in person appointments. The patient expressed understanding and agreed to proceed. ? ?Interactive audio and video telecommunications were attempted between this nurse and patient, however failed, due to patient having technical difficulties OR patient did not have access to video capability.  We continued and completed visit with audio only. ? ?Some vital signs may be absent or patient reported.  ? ?Patricia David, LPN ? ?Subjective:  ? Patricia Decker is a 66 y.o. female who presents for Medicare Annual (Subsequent) preventive examination. ? ?Review of Systems    ? ?  ? ?   ?Objective:  ?  ?There were no vitals filed for this visit. ?There is no height or weight on file to calculate BMI. ? ? ?  04/02/2021  ? 12:07 PM 07/31/2019  ?  6:53 AM 07/10/2019  ?  9:36 AM 07/04/2018  ? 10:09 AM 06/22/2017  ?  8:47 AM 06/30/2016  ?  9:26 AM 06/11/2015  ?  3:08 PM  ?Advanced Directives  ?Does Patient Have a Medical Advance Directive? _0  No No  ?Does patient want to make changes to medical advance directive? No - Patient declined        ?Would patient like information on creating a medical advance directive? No - Patient declined No - Patient declined No - Patient declined No - Patient declined Yes (MAU/Ambulatory/Procedural Areas - Information given)    ? ? ?Current Medications (verified) ?Outpatient Encounter Medications as of 06/28/2021  ?Medication Sig  ? ALPRAZolam (XANAX) 1 MG tablet TAKE 1/2 TO 1 TABLET BY MOUTH TWICE DAILY AS NEEDED FOR ANXIETY  ? aspirin  EC 81 MG tablet Take 1 tablet (81 mg total) by mouth daily.  ? atorvastatin (LIPITOR) 10 MG tablet Take 1 tablet (10 mg total) by mouth daily.  ? clopidogrel (PLAVIX) 75 MG tablet Take 1 tablet (75 mg total) by mouth daily.  ? fluticasone (FLONASE) 50 MCG/ACT nasal spray Place 2 sprays into both nostrils daily.  ? HYDROcodone-acetaminophen (NORCO) 10-325 MG tablet Take 1 tablet by mouth every 8 (eight) hours as needed for moderate pain.  ? ?No facility-administered encounter medications on file as of 06/28/2021.  ? ? ?Allergies (verified) ?Penicillins, Sulfa antibiotics, and Latex  ? ?History: ?Past Medical History:  ?Diagnosis Date  ? Anemia   ? Anxiety   ? Arthritis   ? Asthma   ? Cataract   ? removed  ? Depression   ? Wears dentures   ? Full upper, partial lower  ? ?Past Surgical History:  ?Procedure Laterality Date  ? ABDOMINAL HYSTERECTOMY    ? APPENDECTOMY    ? CATARACT EXTRACTION, BILATERAL    ? CHOLECYSTECTOMY    ? COLONOSCOPY WITH PROPOFOL N/A 06/30/2016  ? Procedure: COLONOSCOPY WITH PROPOFOL;  Surgeon: Jonathon Bellows, MD;  Location[facility]COPY;  Service: Endoscopy;  Laterality: N/A;  ? EAR CYST EXCISION Left 07/31/2019  ? Procedure: EXCISION EAR LESION;  Surgeon: Carloyn Manner, MD;  Location: [facility];  Service: ENT;  Laterality: Left;  Latex  ? LOWER EXTREMITY ANGIOGRAPHY Left 04/02/2021  ?  Procedure: LOWER EXTREMITY ANGIOGRAPHY;  Surgeon: Algernon Huxley, MD;  Location: [facility];  Service: Cardiovascular;  Laterality: Left;  ? ?Family History  ?Problem Relation Age of Onset  ? Alzheimer's disease Sister   ? Breast cancer Sister 72  ? ?Social History  ? ?Socioeconomic History  ? Marital status: Widowed  ?  Spouse name: Not on file  ? Number of children: 1  ? Years of education: Not on file  ? Highest education level: Some college, no degree  ?Occupational History  ? Occupation: disability  ?Tobacco Use  ? Smoking status: Never  ? Smokeless tobacco: Never  ?Vaping Use  ? Vaping Use:  Never used  ?Substance and Sexual Activity  ? Alcohol use: No  ? Drug use: No  ? Sexual activity: Not on file  ?Other Topics Concern  ? Not on file  ?Social History Narrative  ? Not on file  ? ?Social Determinants of Health  ? ?Financial Resource Strain: Not on file  ?Food Insecurity: Not on file  ?Transportation Needs: Not on file  ?Physical Activity: Not on file  ?Stress: Not on file  ?Social Connections: Not on file  ? ? ?Tobacco Counseling ?Counseling given: Not Answered ? ? ?Clinical Intake: ? ?Pre-visit preparation completed: Yes ? ?Pain : No/denies pain ? ?  ? ?Diabetes: No ? ?How often do you need to have someone help you when you read instructions, pamphlets, or other written materials from your doctor or pharmacy?: 1 - Never ? ?Diabetic?no ? ?Interpreter Needed?: No ? ?Information entered by :: Kirke Shaggy, LPN ? ? ?Activities of Daily Living ? ?  04/02/2021  ? 12:05 PM  ?In your present state of health, do you have any difficulty performing the following activities:  ?Hearing? 0  ?Vision? 0  ?Difficulty concentrating or making decisions? 0  ?Dressing or bathing? 0  ? ? ?Patient Care Team: ?Gwyneth Sprout, FNP as PCP - General (Family Medicine) ?Earnestine Leys, MD as Consulting Physician (Orthopedic Surgery) ? ?Indicate any recent Medical Services you may have received from other than [facility] providers in the past year (date may be approximate). ? ?   ?Assessment:  ? This is a routine wellness examination for Patricia Decker. ? ?Hearing/Vision screen ?No results found. ? ?Dietary issues and exercise activities discussed: ?  ? ? Goals Addressed   ?None ?  ? ?Depression Screen ? ?  05/06/2020  ?  1:33 PM 11/20/2019  ?  9:29 AM 07/10/2019  ?  9:32 AM 03/21/2019  ?  8:45 AM 07/04/2018  ? 10:10 AM 06/22/2017  ?  8:47 AM 06/13/2016  ?  9:54 AM  ?PHQ 2/9 Scores  ?PHQ - 2 Score 0 _0 0 2 2  ?PHQ- 9 Score 0 _1 ?  ?Fall Risk ? ?  05/06/2020  ?  1:32 PM 07/10/2019  ?  9:36 AM 03/21/2019  ?  8:41 AM 07/04/2018  ? 10:10 AM 06/22/2017   ?  8:47 AM  ?Fall Risk   ?Falls in the past year? 0 0 0 0 No  ?Number falls in past yr: 0 0 0    ?Injury with Fall? 0 0 0    ?Risk for fall due to : No Fall Risks      ?Follow up Falls evaluation completed  Falls evaluation completed    ? ? ?FALL RISK PREVENTION PERTAINING TO THE HOME: ? ?Any stairs in or around the home? No  ?If  so, are there any without handrails? No  ?Home free of loose throw rugs in walkways, pet beds, electrical cords, etc? Yes  ?Adequate lighting in your home to reduce risk of falls? Yes  ? ?ASSISTIVE DEVICES UTILIZED TO PREVENT FALLS: ? ?Life alert? No  ?Use of a cane, walker or w/c? No  ?Grab bars in the bathroom? No  ?Shower chair or bench in shower? No  ?Elevated toilet seat or a handicapped toilet? No  ? ?Cognitive Function: ?  ?  ? ?  07/10/2019  ?  9:41 AM 06/22/2017  ?  8:51 AM  ?6CIT Screen  ?What Year? 0 points 0 points  ?What month? 0 points 0 points  ?What time? 0 points 0 points  ?Count back from 20 0 points 0 points  ?Months in reverse 0 points 0 points  ?Repeat phrase 0 points 0 points  ?Total Score 0 points 0 points  ? ? ?Immunizations ?Immunization History  ?Administered Date(s) Administered  ? MMR 04/25/2000  ? Moderna Covid-19 Vaccine Bivalent Booster 56yr & up 01/29/2020  ? Moderna Sars-Covid-2 Vaccination 05/22/2019, 06/27/2019  ? ? ?TDAP status: Due, Education has been provided regarding the importance of this vaccine. Advised may receive this vaccine at local pharmacy or Health Dept. Aware to provide a copy of the vaccination record if obtained from local pharmacy or Health Dept. Verbalized acceptance and understanding. ? ?Flu Vaccine status: Declined, Education has been provided regarding the importance of this vaccine but patient still declined. Advised may receive this vaccine at local pharmacy or Health Dept. Aware to provide a copy of the vaccination record if obtained from local pharmacy or Health Dept. Verbalized acceptance and understanding. ? ?Pneumococcal  vaccine status: Declined,  Education has been provided regarding the importance of this vaccine but patient still declined. Advised may receive this vaccine at local pharmacy or Health Dept. Aware to provide a

## 2021-06-28 NOTE — Patient Instructions (Signed)
Ms. Nevel , ?Thank you for taking time to come for your Medicare Wellness Visit. I appreciate your ongoing commitment to your health goals. Please review the following plan we discussed and let me know if I can assist you in the future.  ? ?Screening recommendations/referrals: ?Colonoscopy: 06/30/2016 ?Mammogram: 04/23/21 ?Bone Density: declined referral ?Recommended yearly ophthalmology/optometry visit for glaucoma screening and checkup ?Recommended yearly dental visit for hygiene and checkup ? ?Vaccinations: ?Influenza vaccine: n/d ?Pneumococcal vaccine: n/d ?Tdap vaccine: n/d ?Shingles vaccine: n/d  ?Covid-19: 05/22/19, 06/27/19, 01/29/20 ? ?Advanced directives: no ? ?Conditions/risks identified: none ? ?Next appointment: Follow up in one year for your annual wellness visit. 06/30/22 @ 9:15am by phone ? ?Preventive Care 40-64 Years, Female ?Preventive care refers to lifestyle choices and visits with your health care provider that can promote health and wellness. ?What does preventive care include? ?A yearly physical exam. This is also called an annual well check. ?Dental exams once or twice a year. ?Routine eye exams. Ask your health care provider how often you should have your eyes checked. ?Personal lifestyle choices, including: ?Daily care of your teeth and gums. ?Regular physical activity. ?Eating a healthy diet. ?Avoiding tobacco and drug use. ?Limiting alcohol use. ?Practicing safe sex. ?Taking low-dose aspirin daily starting at age 50. ?Taking vitamin and mineral supplements as recommended by your health care provider. ?What happens during an annual well check? ?The services and screenings done by your health care provider during your annual well check will depend on your age, overall health, lifestyle risk factors, and family history of disease. ?Counseling  ?Your health care provider may ask you questions about your: ?Alcohol use. ?Tobacco use. ?Drug use. ?Emotional well-being. ?Home and relationship  well-being. ?Sexual activity. ?Eating habits. ?Work and work environment. ?Method of birth control. ?Menstrual cycle. ?Pregnancy history. ?Screening  ?You may have the following tests or measurements: ?Height, weight, and BMI. ?Blood pressure. ?Lipid and cholesterol levels. These may be checked every 5 years, or more frequently if you are over 50 years old. ?Skin check. ?Lung cancer screening. You may have this screening every year starting at age 55 if you have a 30-pack-year history of smoking and currently smoke or have quit within the past 15 years. ?Fecal occult blood test (FOBT) of the stool. You may have this test every year starting at age 50. ?Flexible sigmoidoscopy or colonoscopy. You may have a sigmoidoscopy every 5 years or a colonoscopy every 10 years starting at age 50. ?Hepatitis C blood test. ?Hepatitis B blood test. ?Sexually transmitted disease (STD) testing. ?Diabetes screening. This is done by checking your blood sugar (glucose) after you have not eaten for a while (fasting). You may have this done every 1-3 years. ?Mammogram. This may be done every 1-2 years. Talk to your health care provider about when you should start having regular mammograms. This may depend on whether you have a family history of breast cancer. ?BRCA-related cancer screening. This may be done if you have a family history of breast, ovarian, tubal, or peritoneal cancers. ?Pelvic exam and Pap test. This may be done every 3 years starting at age 21. Starting at age 30, this may be done every 5 years if you have a Pap test in combination with an HPV test. ?Bone density scan. This is done to screen for osteoporosis. You may have this scan if you are at high risk for osteoporosis. ?Discuss your test results, treatment options, and if necessary, the need for more tests with your health care provider. ?Vaccines  ?  Your health care provider may recommend certain vaccines, such as: ?Influenza vaccine. This is recommended every  year. ?Tetanus, diphtheria, and acellular pertussis (Tdap, Td) vaccine. You may need a Td booster every 10 years. ?Zoster vaccine. You may need this after age 39. ?Pneumococcal 13-valent conjugate (PCV13) vaccine. You may need this if you have certain conditions and were not previously vaccinated. ?Pneumococcal polysaccharide (PPSV23) vaccine. You may need one or two doses if you smoke cigarettes or if you have certain conditions. ?Talk to your health care provider about which screenings and vaccines you need and how often you need them. ?This information is not intended to replace advice given to you by your health care provider. Make sure you discuss any questions you have with your health care provider. ?Document Released: 03/20/2015 Document Revised: 11/11/2015 Document Reviewed: 12/23/2014 ?Elsevier Interactive Patient Education ? 2017 [employer] ? ? ? ?Fall Prevention in the Home ?Falls can cause injuries. They can happen to people of all ages. There are many things you can do to make your home safe and to help prevent falls. ?What can I do on the outside of my home? ?Regularly fix the edges of walkways and driveways and fix any cracks. ?Remove anything that might make you trip as you walk through a door, such as a raised step or threshold. ?Trim any bushes or trees on the path to your home. ?Use bright outdoor lighting. ?Clear any walking paths of anything that might make someone trip, such as rocks or tools. ?Regularly check to see if handrails are loose or broken. Make sure that both sides of any steps have handrails. ?Any raised decks and porches should have guardrails on the edges. ?Have any leaves, snow, or ice cleared regularly. ?Use sand or salt on walking paths during winter. ?Clean up any spills in your garage right away. This includes oil or grease spills. ?What can I do in the bathroom? ?Use night lights. ?Install grab bars by the toilet and in the tub and shower. Do not use towel bars as grab  bars. ?Use non-skid mats or decals in the tub or shower. ?If you need to sit down in the shower, use a plastic, non-slip stool. ?Keep the floor dry. Clean up any water that spills on the floor as soon as it happens. ?Remove soap buildup in the tub or shower regularly. ?Attach bath mats securely with double-sided non-slip rug tape. ?Do not have throw rugs and other things on the floor that can make you trip. ?What can I do in the bedroom? ?Use night lights. ?Make sure that you have a light by your bed that is easy to reach. ?Do not use any sheets or blankets that are too big for your bed. They should not hang down onto the floor. ?Have a firm chair that has side arms. You can use this for support while you get dressed. ?Do not have throw rugs and other things on the floor that can make you trip. ?What can I do in the kitchen? ?Clean up any spills right away. ?Avoid walking on wet floors. ?Keep items that you use a lot in easy-to-reach places. ?If you need to reach something above you, use a strong step stool that has a grab bar. ?Keep electrical cords out of the way. ?Do not use floor polish or wax that makes floors slippery. If you must use wax, use non-skid floor wax. ?Do not have throw rugs and other things on the floor that can make you trip. ?What  can I do with my stairs? ?Do not leave any items on the stairs. ?Make sure that there are handrails on both sides of the stairs and use them. Fix handrails that are broken or loose. Make sure that handrails are as long as the stairways. ?Check any carpeting to make sure that it is firmly attached to the stairs. Fix any carpet that is loose or worn. ?Avoid having throw rugs at the top or bottom of the stairs. If you do have throw rugs, attach them to the floor with carpet tape. ?Make sure that you have a light switch at the top of the stairs and the bottom of the stairs. If you do not have them, ask someone to add them for you. ?What else can I do to help prevent  falls? ?Wear shoes that: ?Do not have high heels. ?Have rubber bottoms. ?Are comfortable and fit you well. ?Are closed at the toe. Do not wear sandals. ?If you use a stepladder: ?Make sure that it is fully opened. Do not c

## 2021-07-06 ENCOUNTER — Ambulatory Visit: Payer: Self-pay | Admitting: *Deleted

## 2021-07-06 NOTE — Telephone Encounter (Signed)
?  Chief Complaint:   Blood in tissue this morning when blew her nose.   Nose burning and congested.  Ears closing up when she blows her nose.   (Seen 06/25/2021 for seasonal allergies) ?Symptoms: nasal congestion, bloody mucus this morning, nose burning and ears closing when she blows her nose ?Frequency: "A lot" of blowing my nose.    Not using the Flonase spray prescribed. ?Pertinent Negatives: Patient denies fever.   Got the bleeding stopped this morning ?Disposition: '[]'$ ED /'[]'$ Urgent Care (no appt availability in office) / '[]'$ Appointment(In office/virtual)/ '[]'$  [facility]/ '[]'$ Home Care/ '[]'$ Refused Recommended Disposition /'[]'$ [facility]/ '[x]'$  Follow-up with PCP ?Additional Notes: Message sent to Tally Joe, FNP.   Pt agreeable to being called back since she was recently seen.   ?

## 2021-07-06 NOTE — Telephone Encounter (Signed)
Patient advised.KW 

## 2021-07-06 NOTE — Telephone Encounter (Signed)
Please review and advise.

## 2021-07-06 NOTE — Telephone Encounter (Signed)
I returned pt's call.  Sinuses are getting better (Seen 06/25/2021 for seasonal allergies) but today she had blood in her Kleenex when she blew her nose this morning.  The bleeding has stopped.  C/o her ears closing when she blows her nose.  ? ? ?Reason for Disposition ? [1] Fever returns after gone for over 24 hours AND [2] symptoms worse or not improved ? ?Answer Assessment - Initial Assessment Questions ?1. LOCATION: "Where does it hurt?"  ?    When I blow my nose it's thick but it's clear.  I'm blowing my nose a lot.   It had blood in my tissue this morning.   It was Kleenex full of blood when I blew it first thing this morning.   My nose is burning too.    I put a tissue up in my nose and the bleeding stopped.   I've been blowing it for 3 weeks.   I thought it was pollen but now I'm not sure.   My nose is sore now.   I'm just constantly blowing my nose.   ?2. ONSET: "When did the sinus pain start?"  (e.g., hours, days)  ?    Seen on 06/25/2021 for this.    I am not using the Flonase spray.  My nose is burning so I just didn't want to spray anything in my nose.    If she wants me to use the spray I will but just wanted to check with her first since I had blood this morning in my Kleenex. ? ?My ears are also closing up when I blow my nose.   I've tried to open them by holding my nose and blowing but it doesn't seem to be helping.   ?  ? ? ?3. SEVERITY: "How bad is the pain?"   (Scale 1-10; mild, moderate or severe) ?  - MILD (1-3): doesn't interfere with normal activities  ?  - MODERATE (4-7): interferes with normal activities (e.g., work or school) or awakens from sleep ?  - SEVERE (8-10): excruciating pain and patient unable to do any normal activities    ?    Moderate burning in my nose and blowing out clear mucus except for the blood mucus I had this morning. ?4. RECURRENT SYMPTOM: "Have you ever had sinus problems before?" If Yes, ask: "When was the last time?" and "What happened that time?"  ?    Yes but it's  been a while since I've had a sinus infection. ?5. NASAL CONGESTION: "Is the nose blocked?" If Yes, ask: "Can you open it or must you breathe through your mouth?" ?    Yes   It's not running but it's full of mucus. ?6. NASAL DISCHARGE: "Do you have discharge from your nose?" If so ask, "What color?" ?    Not really ?7. FEVER: "Do you have a fever?" If Yes, ask: "What is it, how was it measured, and when did it start?"  ?    *No Answer* ?8. OTHER SYMPTOMS: "Do you have any other symptoms?" (e.g., sore throat, cough, earache, difficulty breathing) ?    Ears closing up ?9. PREGNANCY: "Is there any chance you are pregnant?" "When was your last menstrual period?" ?    N/A ? ?Protocols used: Sinus Pain or Congestion-A-AH ? ?

## 2021-07-29 ENCOUNTER — Other Ambulatory Visit (INDEPENDENT_AMBULATORY_CARE_PROVIDER_SITE_OTHER): Payer: Self-pay | Admitting: Nurse Practitioner

## 2021-07-29 DIAGNOSIS — I739 Peripheral vascular disease, unspecified: Secondary | ICD-10-CM

## 2021-07-30 ENCOUNTER — Ambulatory Visit (INDEPENDENT_AMBULATORY_CARE_PROVIDER_SITE_OTHER): Payer: Medicare Other | Admitting: Vascular Surgery

## 2021-07-30 ENCOUNTER — Encounter (INDEPENDENT_AMBULATORY_CARE_PROVIDER_SITE_OTHER): Payer: Self-pay | Admitting: Vascular Surgery

## 2021-07-30 ENCOUNTER — Ambulatory Visit (INDEPENDENT_AMBULATORY_CARE_PROVIDER_SITE_OTHER): Payer: Medicare Other

## 2021-07-30 VITALS — BP 146/77 | HR 65 | Resp 17 | Ht 65.0 in | Wt 134.0 lb

## 2021-07-30 DIAGNOSIS — I70212 Atherosclerosis of native arteries of extremities with intermittent claudication, left leg: Secondary | ICD-10-CM | POA: Diagnosis not present

## 2021-07-30 DIAGNOSIS — I739 Peripheral vascular disease, unspecified: Secondary | ICD-10-CM

## 2021-07-30 DIAGNOSIS — E7800 Pure hypercholesterolemia, unspecified: Secondary | ICD-10-CM

## 2021-07-30 DIAGNOSIS — L308 Other specified dermatitis: Secondary | ICD-10-CM | POA: Diagnosis not present

## 2021-07-30 NOTE — Assessment & Plan Note (Signed)
Left leg claudication symptoms essentially resolved after intervention earlier this year.  ABIs today are 1.01 on the right and 1.02 on the left with triphasic waveforms and normal digital pressures and waveforms bilaterally.  Overall doing well.  Continue current medical regimen.  Stretch out her follow-up to 6 months at this point.

## 2021-07-30 NOTE — Progress Notes (Signed)
MRN : [id number]  Patricia Decker is a 66 y.o. (1955-10-11) female who presents with chief complaint of No chief complaint on file. Marland Kitchen  History of Present Illness: Patient returns today in follow up of her peripheral arterial disease.  She underwent revascularization for aortoiliac disease about 4 months ago.  She is doing great.  She has had essentially resolution of her claudication symptoms with marked improvement almost immediately after her procedure.  No periprocedural complications.  Tolerating her medical regimen without difficulties. ABIs today are 1.01 on the right and 1.02 on the left with triphasic waveforms and normal digital pressures and waveforms bilaterally.  Current Outpatient Medications  Medication Sig Dispense Refill   ALPRAZolam (XANAX) 1 MG tablet TAKE 1/2 TO 1 TABLET BY MOUTH TWICE DAILY AS NEEDED FOR ANXIETY 60 tablet 5   aspirin EC 81 MG tablet Take 1 tablet (81 mg total) by mouth daily. 150 tablet 2   atorvastatin (LIPITOR) 10 MG tablet Take 1 tablet (10 mg total) by mouth daily. 30 tablet 11   clopidogrel (PLAVIX) 75 MG tablet Take 1 tablet (75 mg total) by mouth daily. 30 tablet 11   HYDROcodone-acetaminophen (NORCO) 10-325 MG tablet Take 1 tablet by mouth every 8 (eight) hours as needed for moderate pain. 60 tablet 0   fluticasone (FLONASE) 50 MCG/ACT nasal spray Place 2 sprays into both nostrils daily. (Patient not taking: Reported on 07/30/2021) 16 g 6   No current facility-administered medications for this visit.    Past Medical History:  Diagnosis Date   Anemia    Anxiety    Arthritis    Asthma    Cataract    removed   Depression    Wears dentures    Full upper, partial lower    Past Surgical History:  Procedure Laterality Date   ABDOMINAL HYSTERECTOMY     APPENDECTOMY     CATARACT EXTRACTION, BILATERAL     CHOLECYSTECTOMY     COLONOSCOPY WITH PROPOFOL N/A 06/30/2016   Procedure: COLONOSCOPY WITH PROPOFOL;  Surgeon: Jonathon Bellows, MD;  Location:  [facility];  Service: Endoscopy;  Laterality: N/A;   EAR CYST EXCISION Left 07/31/2019   Procedure: EXCISION EAR LESION;  Surgeon: Carloyn Manner, MD;  Location: [facility];  Service: ENT;  Laterality: Left;  Latex   LOWER EXTREMITY ANGIOGRAPHY Left 04/02/2021   Procedure: LOWER EXTREMITY ANGIOGRAPHY;  Surgeon: Algernon Huxley, MD;  Location: [facility];  Service: Cardiovascular;  Laterality: Left;     Social History   Tobacco Use   Smoking status: Never   Smokeless tobacco: Never  Vaping Use   Vaping Use: Never used  Substance Use Topics   Alcohol use: No   Drug use: No      Family History  Problem Relation Age of Onset   Alzheimer's disease Sister    Breast cancer Sister 42     Allergies  Allergen Reactions   Penicillins    Sulfa Antibiotics     Indigestion   Latex Rash    Gloves irritate hands when worn     REVIEW OF SYSTEMS (Negative unless checked)  Constitutional: '[]'$ Weight loss  '[]'$ Fever  '[]'$ Chills Cardiac: '[]'$ Chest pain   '[]'$ Chest pressure   '[]'$ Palpitations   '[]'$ Shortness of breath when laying flat   '[]'$ Shortness of breath at rest   '[]'$ Shortness of breath with exertion. Vascular:  '[x]'$ Pain in legs with walking   '[]'$ Pain in legs at rest   '[]'$ Pain in legs when laying flat   [  x]Claudication   '[]'$ Pain in feet when walking  '[]'$ Pain in feet at rest  '[]'$ Pain in feet when laying flat   '[]'$ History of DVT   '[]'$ Phlebitis   '[]'$ Swelling in legs   '[]'$ Varicose veins   '[]'$ Non-healing ulcers Pulmonary:   '[]'$ Uses home oxygen   '[]'$ Productive cough   '[]'$ Hemoptysis   '[]'$ Wheeze  '[]'$ COPD   '[x]'$ Asthma Neurologic:  '[]'$ Dizziness  '[]'$ Blackouts   '[]'$ Seizures   '[]'$ History of stroke   '[]'$ History of TIA  '[]'$ Aphasia   '[]'$ Temporary blindness   '[]'$ Dysphagia   '[]'$ Weakness or numbness in arms   '[]'$ Weakness or numbness in legs Musculoskeletal:  '[x]'$ Arthritis   '[]'$ Joint swelling   '[]'$ Joint pain   '[]'$ Low back pain Hematologic:  '[]'$ Easy bruising  '[]'$ Easy bleeding   '[]'$ Hypercoagulable state   '[x]'$ Anemic   Gastrointestinal:   '[]'$ Blood in stool   '[]'$ Vomiting blood  '[]'$ Gastroesophageal reflux/heartburn   '[]'$ Abdominal pain Genitourinary:  '[]'$ Chronic kidney disease   '[]'$ Difficult urination  '[]'$ Frequent urination  '[]'$ Burning with urination   '[]'$ Hematuria Skin:  '[]'$ Rashes   '[]'$ Ulcers   '[]'$ Wounds Psychological:  '[x]'$ History of anxiety   '[x]'$  History of major depression.  Physical Examination  BP (!) 146/77 (BP Location: Right Arm)   Pulse 65   Resp 17   Ht '5\' 5"'$  (1.651 m)   Wt 134 lb (60.8 kg)   BMI 22.30 kg/m  Gen:  WD/WN, NAD Head: Veteran/AT, No temporalis wasting. Ear/Nose/Throat: Hearing grossly intact, nares w/o erythema or drainage Eyes: Conjunctiva clear. Sclera non-icteric Neck: Supple.  Trachea midline Pulmonary:  Good air movement, no use of accessory muscles.  Cardiac: RRR, no JVD Vascular:  Vessel Right Left  Radial Palpable Palpable                          PT Palpable Palpable  DP Palpable Palpable   Gastrointestinal: soft, non-tender/non-distended. No guarding/reflex.  Musculoskeletal: M/S 5/5 throughout.  No deformity or atrophy.  No edema. Neurologic: Sensation grossly intact in extremities.  Symmetrical.  Speech is fluent.  Psychiatric: Judgment intact, Mood & affect appropriate for pt's clinical situation. Dermatologic: No rashes or ulcers noted.  No cellulitis or open wounds.      Labs No results found for this or any previous visit (from the past 2160 hour(s)).  Radiology No results found.  Assessment/Plan  Hypercholesterolemia lipid control important in reducing the progression of atherosclerotic disease. Continue statin therapy   PAD (peripheral artery disease) (HCC) Left leg claudication symptoms essentially resolved after intervention earlier this year.  ABIs today are 1.01 on the right and 1.02 on the left with triphasic waveforms and normal digital pressures and waveforms bilaterally.  Overall doing well.  Continue current medical regimen.  Stretch out her follow-up to 6 months at  this point.    [name], MD  07/30/2021 10:30 AM    This note was created with Dragon medical transcription system.  Any errors from dictation are purely unintentional

## 2021-07-30 NOTE — Assessment & Plan Note (Signed)
lipid control important in reducing the progression of atherosclerotic disease. Continue statin therapy  

## 2021-08-19 DIAGNOSIS — H43813 Vitreous degeneration, bilateral: Secondary | ICD-10-CM | POA: Diagnosis not present

## 2021-08-19 DIAGNOSIS — Z961 Presence of intraocular lens: Secondary | ICD-10-CM | POA: Diagnosis not present

## 2021-08-20 ENCOUNTER — Telehealth (INDEPENDENT_AMBULATORY_CARE_PROVIDER_SITE_OTHER): Payer: Self-pay | Admitting: Vascular Surgery

## 2021-08-20 NOTE — Telephone Encounter (Signed)
Patient called in stating that she had a stint put in. And hasn't had any problems since but this morning she woke up with her leg cramping really bad and she's very concerned wanting Dr to advise her    Please call and advise

## 2021-08-21 ENCOUNTER — Emergency Department: Payer: Medicare Other

## 2021-08-21 ENCOUNTER — Emergency Department
Admission: EM | Admit: 2021-08-21 | Discharge: 2021-08-21 | Disposition: A | Discharge status: Home / Self Care | Payer: Medicare Other | Attending: Emergency Medicine | Admitting: Emergency Medicine

## 2021-08-21 ENCOUNTER — Other Ambulatory Visit: Payer: Self-pay

## 2021-08-21 ENCOUNTER — Encounter: Payer: Self-pay | Admitting: Emergency Medicine

## 2021-08-21 DIAGNOSIS — M79662 Pain in left lower leg: Secondary | ICD-10-CM | POA: Diagnosis not present

## 2021-08-21 DIAGNOSIS — F17210 Nicotine dependence, cigarettes, uncomplicated: Secondary | ICD-10-CM | POA: Insufficient documentation

## 2021-08-21 DIAGNOSIS — I878 Other specified disorders of veins: Secondary | ICD-10-CM | POA: Diagnosis not present

## 2021-08-21 DIAGNOSIS — M1612 Unilateral primary osteoarthritis, left hip: Secondary | ICD-10-CM | POA: Diagnosis not present

## 2021-08-21 DIAGNOSIS — M79605 Pain in left leg: Secondary | ICD-10-CM | POA: Insufficient documentation

## 2021-08-21 NOTE — ED Notes (Signed)
See triage note  presents with pain to left leg  states pain started yesterday  starts at groin and moves into upper leg  unsure of any injury  states she did fall about 2 months ago

## 2021-08-21 NOTE — ED Notes (Signed)
U/s in with pt 

## 2021-08-21 NOTE — ED Notes (Signed)
Pt is back from x-ray  awaiting results

## 2021-08-21 NOTE — ED Provider Notes (Signed)
[facility] Provider Note    Event Date/Time   First MD Initiated Contact with Patient 08/21/21 0719     (approximate)   History   Leg Pain   HPI  Patricia Decker is a 66 y.o. female with history of vascular stent in the left leg, arthritis and smoker presents emergency department with left leg pain.  Patient states she called Dr. Lazaro Arms and they told her to come to the emergency department.  States she has had a stent placed for over 3 months and had done really well.  States now she is having a lot of pain along the inner thigh.  States it runs from the upper thigh to the mid thigh.  Her foot does not feel cold.  No numbness or tingling.  No known injury.      Physical Exam   Triage Vital Signs: ED Triage Vitals  Enc Vitals Group     BP 08/21/21 0718 (!) 163/86     Pulse Rate 08/21/21 0718 76     Resp 08/21/21 0718 17     Temp 08/21/21 0718 98 F (36.7 C)     Temp Source 08/21/21 0718 Oral     SpO2 08/21/21 0718 99 %     Weight 08/21/21 0714 136 lb (61.7 kg)     Height 08/21/21 0714 '5\' 5"'$  (1.651 m)     Head Circumference --      Peak Flow --      Pain Score 08/21/21 0713 10     Pain Loc --      Pain Edu? --      Excl. in [place]? --     Most recent vital signs: Vitals:   08/21/21 0718 08/21/21 0949  BP: (!) 163/86 (!) 158/80  Pulse: 76 70  Resp: 17 16  Temp: 98 F (36.7 C)   SpO2: 99% 99%     General: Awake, no distress.   CV:  Good peripheral perfusion. regular rate and  rhythm Resp:  Normal effort.  Abd:  No distention.   Other:  Femoral pulses intact, left thigh is tender along the great saphenous vein the area, calf is not tender, neurovascular intact   ED Results / Procedures / Treatments   Labs (all labs ordered are listed, but only abnormal results are displayed) Labs Reviewed - No data to display   EKG     RADIOLOGY Ultrasound venous left lower extremity, ultrasound arterial left lower  extremity    PROCEDURES:   Procedures   MEDICATIONS ORDERED IN ED: Medications - No data to display   IMPRESSION / MDM / [place] / ED COURSE  I reviewed the triage vital signs and the nursing notes.                              Differential diagnosis includes, but is not limited to, DVT, clotting of the stent, muscle strain, lumbar radiculopathy, hip pain  Patient's presentation is most consistent with acute presentation with potential threat to life or bodily function.    Ultrasound left lower extremity for DVT is negative, interpreted by me confirmed by radiology  X-ray of the left hip interpreted by me as being negative for any acute abnormality.  Patient is to follow-up with Dr. Waunita Schooner.  She had a strong femoral pulse and had a ABI last week which showed normal blood pressures up and down the leg.  Do  not feel that we need to admit her.  She can follow-up with Dr. Laurence Compton for her already scheduled appointment for Friday.  Return if worsening.  She is discharged stable condition.      FINAL CLINICAL IMPRESSION(S) / ED DIAGNOSES   Final diagnoses:  Left leg pain     Rx / DC Orders   ED Discharge Orders     None        Note:  This document was prepared using Dragon voice recognition software and may include unintentional dictation errors.    Versie Starks, PA-C 08/21/21 2224    Vladimir Crofts, MD 08/21/21 1346

## 2021-08-21 NOTE — ED Triage Notes (Signed)
Pt via POV from home. Pt has a stent place in her L leg in January. Pt states now she has been having L leg pain. Denies any swelling. Denies any discoloration the leg. Pt does take Plavix. Pt is A&Ox4 and NAD. Ambulatory to triage.

## 2021-08-23 NOTE — Telephone Encounter (Signed)
scheduled

## 2021-08-26 ENCOUNTER — Other Ambulatory Visit (INDEPENDENT_AMBULATORY_CARE_PROVIDER_SITE_OTHER): Payer: Self-pay | Admitting: Family Medicine

## 2021-08-26 DIAGNOSIS — M79662 Pain in left lower leg: Secondary | ICD-10-CM

## 2021-08-27 ENCOUNTER — Ambulatory Visit (INDEPENDENT_AMBULATORY_CARE_PROVIDER_SITE_OTHER): Payer: Medicare Other | Admitting: Nurse Practitioner

## 2021-08-27 ENCOUNTER — Ambulatory Visit (INDEPENDENT_AMBULATORY_CARE_PROVIDER_SITE_OTHER): Payer: Medicare Other

## 2021-08-27 ENCOUNTER — Encounter (INDEPENDENT_AMBULATORY_CARE_PROVIDER_SITE_OTHER): Payer: Self-pay | Admitting: Nurse Practitioner

## 2021-08-27 VITALS — BP 136/71 | HR 60 | Resp 16 | Wt 132.0 lb

## 2021-08-27 DIAGNOSIS — M79662 Pain in left lower leg: Secondary | ICD-10-CM

## 2021-08-27 DIAGNOSIS — M5442 Lumbago with sciatica, left side: Secondary | ICD-10-CM

## 2021-08-27 DIAGNOSIS — E7800 Pure hypercholesterolemia, unspecified: Secondary | ICD-10-CM | POA: Diagnosis not present

## 2021-09-11 ENCOUNTER — Encounter (INDEPENDENT_AMBULATORY_CARE_PROVIDER_SITE_OTHER): Payer: Self-pay | Admitting: Nurse Practitioner

## 2021-09-11 NOTE — Progress Notes (Signed)
Subjective:    Patient ID: Patricia Decker, female    DOB: 20-Jun-1955, 66 y.o.   MRN: [id number] Chief Complaint  Patient presents with   Follow-up    Left leg pain    Patricia Decker is a 66 year old female who presents today for follow-up evaluation of leg pain.  She notes that she experienced pain 1 evening in her left leg and it was severe.  She notes it was a tight cramping-like sensation that subsequently resolved without issue.  However she notes that at that time the pain was severe in nature.  She denies any open wounds or ulcerations.  She has not had any recurrent events since that time.  The patient does have a history of angioplasty and stent placement to the left lower extremity.  Today the patient has ABI 0.97 on the right and 0.94 on the left.  Previous study showed an ABI of 1.01 on the right and 1.02 on the left.  Patient has triphasic tibial artery waveforms bilaterally with good toe waveforms bilaterally.    Review of Systems  All other systems reviewed and are negative.      Objective:   Physical Exam Vitals reviewed.  HENT:     Head: Normocephalic.  Cardiovascular:     Rate and Rhythm: Normal rate.     Pulses:          Dorsalis pedis pulses are detected w/ Doppler on the right side and detected w/ Doppler on the left side.       Posterior tibial pulses are detected w/ Doppler on the right side and detected w/ Doppler on the left side.  Pulmonary:     Effort: Pulmonary effort is normal.  Skin:    General: Skin is warm and dry.     Capillary Refill: Capillary refill takes less than 2 seconds.  Neurological:     Mental Status: She is alert and oriented to person, place, and time.  Psychiatric:        Mood and Affect: Mood normal.        Behavior: Behavior normal.        Thought Content: Thought content normal.        Judgment: Judgment normal.     BP 136/71 (BP Location: Right Arm)   Pulse 60   Resp 16   Wt 132 lb (59.9 kg)   BMI 21.97 kg/m    Past Medical History:  Diagnosis Date   Anemia    Anxiety    Arthritis    Asthma    Cataract    removed   Depression    Wears dentures    Full upper, partial lower    Social History   Socioeconomic History   Marital status: Widowed    Spouse name: Not on file   Number of children: 1   Years of education: Not on file   Highest education level: Some college, no degree  Occupational History   Occupation: disability  Tobacco Use   Smoking status: Never   Smokeless tobacco: Never  Vaping Use   Vaping Use: Never used  Substance and Sexual Activity   Alcohol use: No   Drug use: No   Sexual activity: Not on file  Other Topics Concern   Not on file  Social History Narrative   Not on file   Social Determinants of Health   Financial Resource Strain: Low Risk  (06/28/2021)   Overall Financial Resource Strain (CARDIA)    Difficulty of  Paying Living Expenses: Not hard at all  Food Insecurity: No Food Insecurity (06/28/2021)   Hunger Vital Sign    Worried About Running Out of Food in the Last Year: Never true    Ran Out of Food in the Last Year: Never true  Transportation Needs: No Transportation Needs (06/28/2021)   PRAPARE - Hydrologist (Medical): No    Lack of Transportation (Non-Medical): No  Physical Activity: Insufficiently Active (06/28/2021)   Exercise Vital Sign    Days of Exercise per Week: 2 days    Minutes of Exercise per Session: 20 min  Stress: No Stress Concern Present (06/28/2021)   [place]    Feeling of Stress : Not at all  Social Connections: Socially Isolated (06/28/2021)   Social Connection and Isolation Panel [NHANES]    Frequency of Communication with Friends and Family: Once a week    Frequency of Social Gatherings with Friends and Family: Once a week    Attends Religious Services: More than 4 times per year    Active Member of [employer] or Organizations:  No    Attends Archivist Meetings: Never    Marital Status: Widowed  Intimate Partner Violence: Not At Risk (06/28/2021)   Humiliation, Afraid, Rape, and Kick questionnaire    Fear of Current or Ex-Partner: No    Emotionally Abused: No    Physically Abused: No    Sexually Abused: No    Past Surgical History:  Procedure Laterality Date   ABDOMINAL HYSTERECTOMY     APPENDECTOMY     CATARACT EXTRACTION, BILATERAL     CHOLECYSTECTOMY     COLONOSCOPY WITH PROPOFOL N/A 06/30/2016   Procedure: COLONOSCOPY WITH PROPOFOL;  Surgeon: Jonathon Bellows, MD;  Location: [facility];  Service: Endoscopy;  Laterality: N/A;   EAR CYST EXCISION Left 07/31/2019   Procedure: EXCISION EAR LESION;  Surgeon: Carloyn Manner, MD;  Location: [place];  Service: ENT;  Laterality: Left;  Latex   LOWER EXTREMITY ANGIOGRAPHY Left 04/02/2021   Procedure: LOWER EXTREMITY ANGIOGRAPHY;  Surgeon: Algernon Huxley, MD;  Location: [facility];  Service: Cardiovascular;  Laterality: Left;    Family History  Problem Relation Age of Onset   Alzheimer's disease Sister    Breast cancer Sister 23    Allergies  Allergen Reactions   Penicillins    Sulfa Antibiotics     Indigestion   Latex Rash    Gloves irritate hands when worn       Latest Ref Rng & Units 05/06/2020    2:19 PM 05/08/2019   10:43 AM 04/10/2019   10:19 AM  CBC  WBC 3.4 - 10.8 x10E3/uL 7.4  6.6  5.2   Hemoglobin 11.1 - 15.9 g/dL 11.5  10.7  10.5   Hematocrit 34.0 - 46.6 % 35.9  33.2  31.8   Platelets 150 - 450 x10E3/uL 430  308  310       CMP     Component Value Date/Time   NA 140 05/06/2020 1419   K 4.5 05/06/2020 1419   CL 103 05/06/2020 1419   CO2 20 05/06/2020 1419   GLUCOSE 98 05/06/2020 1419   BUN 13 04/02/2021 1142   BUN 14 05/06/2020 1419   CREATININE 0.69 04/02/2021 1142   CALCIUM 9.7 05/06/2020 1419   PROT 7.5 05/06/2020 1419   ALBUMIN 4.6 05/06/2020 1419   AST 21 05/06/2020 1419   ALT 10  05/06/2020  1419   ALKPHOS 74 05/06/2020 1419   BILITOT <0.2 05/06/2020 1419   GFRNONAA >60 04/02/2021 1142   GFRAA 84 03/21/2019 0931     VAS [place] ABI WITH/WO TBI  Result Date: 09/01/2021  LOWER EXTREMITY DOPPLER STUDY Patient Name:  TIANE SZYDLOWSKI  Date of Exam:   08/27/2021 Medical Rec #: [id number]         Accession #:    [id number] Date of Birth: 02-24-1956          Patient Gender: F Patient Age:   66 years Exam Location:  [facility] Procedure:      VAS [place] ABI WITH/WO TBI Referring Phys: [name] --------------------------------------------------------------------------------  Indications: Peripheral artery disease.  Vascular Interventions: 04/02/2021 left pta and cia stent. Comparison Study: 07/30/2021 Performing Technologist: Almira Coaster RVS  Examination Guidelines: A complete evaluation includes at minimum, Doppler waveform signals and systolic blood pressure reading at the level of bilateral brachial, anterior tibial, and posterior tibial arteries, when vessel segments are accessible. Bilateral testing is considered an integral part of a complete examination. Photoelectric Plethysmograph (PPG) waveforms and toe systolic pressure readings are included as required and additional duplex testing as needed. Limited examinations for reoccurring indications may be performed as noted.  ABI Findings: +---------+------------------+-----+---------+--------+ Right    Rt Pressure (mmHg)IndexWaveform Comment  +---------+------------------+-----+---------+--------+ Brachial 156                                      +---------+------------------+-----+---------+--------+ ATA      147               0.94 triphasic         +---------+------------------+-----+---------+--------+ PTA      152               0.97 triphasic         +---------+------------------+-----+---------+--------+ Great Toe142               0.91 Normal            +---------+------------------+-----+---------+--------+  +---------+------------------+-----+---------+-------+ Left     Lt Pressure (mmHg)IndexWaveform Comment +---------+------------------+-----+---------+-------+ Brachial 156                                     +---------+------------------+-----+---------+-------+ ATA      135               0.87 biphasic         +---------+------------------+-----+---------+-------+ PTA      146               0.94 triphasic        +---------+------------------+-----+---------+-------+ Great Toe124               0.79 Normal           +---------+------------------+-----+---------+-------+ +-------+-----------+-----------+------------+------------+ ABI/TBIToday's ABIToday's TBIPrevious ABIPrevious TBI +-------+-----------+-----------+------------+------------+ Right  .97        .91        1.01        .86          +-------+-----------+-----------+------------+------------+ Left   .94        .79        1.02        .94          +-------+-----------+-----------+------------+------------+  Bilateral ABIs appear decreased compared to prior study on  07/30/2021. Right TBIs appear increased compared to prior study on 07/30/2021. Left TBIs appear to be decreased compared to prior study on 07/30/2021.  Summary: Right: Resting right ankle-brachial index is within normal range. No evidence of significant right lower extremity arterial disease. The right toe-brachial index is normal. Left: Resting left ankle-brachial index indicates mild left lower extremity arterial disease. The left toe-brachial index is normal.  *See table(s) above for measurements and observations.  Electronically signed by [name] MD on 09/01/2021 at 2:43:10 PM.    Final    VAS [place] ABI WITH/WO TBI  Result Date: 08/04/2021  LOWER EXTREMITY DOPPLER STUDY Patient Name:  NUMA HEATWOLE  Date of Exam:   07/30/2021 Medical Rec #: [id number]         Accession #:    [id number] Date of Birth: Dec 15, 1955          Patient Gender: F Patient  Age:   44 years Exam Location:  [facility] Procedure:      VAS [place] ABI WITH/WO TBI Referring Phys: --------------------------------------------------------------------------------  Indications: Peripheral artery disease.  Vascular Interventions: 04/02/2021 left pta and cia stent. Comparison Study: 04/30/2021 Performing Technologist: Almira Coaster RVS  Examination Guidelines: A complete evaluation includes at minimum, Doppler waveform signals and systolic blood pressure reading at the level of bilateral brachial, anterior tibial, and posterior tibial arteries, when vessel segments are accessible. Bilateral testing is considered an integral part of a complete examination. Photoelectric Plethysmograph (PPG) waveforms and toe systolic pressure readings are included as required and additional duplex testing as needed. Limited examinations for reoccurring indications may be performed as noted.  ABI Findings: +---------+------------------+-----+---------+--------+ Right    Rt Pressure (mmHg)IndexWaveform Comment  +---------+------------------+-----+---------+--------+ Brachial 156                                      +---------+------------------+-----+---------+--------+ ATA      157               1.01 triphasic         +---------+------------------+-----+---------+--------+ PTA      158               1.01 triphasic         +---------+------------------+-----+---------+--------+ Great Toe134               0.86 Normal            +---------+------------------+-----+---------+--------+ +---------+------------------+-----+---------+-------+ Left     Lt Pressure (mmHg)IndexWaveform Comment +---------+------------------+-----+---------+-------+ Brachial 140                                     +---------+------------------+-----+---------+-------+ ATA      141               0.90 triphasic        +---------+------------------+-----+---------+-------+ PTA      159                1.02 triphasic        +---------+------------------+-----+---------+-------+ Great Toe147               0.94 Normal           +---------+------------------+-----+---------+-------+ +-------+-----------+-----------+------------+------------+ ABI/TBIToday's ABIToday's TBIPrevious ABIPrevious TBI +-------+-----------+-----------+------------+------------+ Right  1.01       .86        1.24  1.15         +-------+-----------+-----------+------------+------------+ Left   1.02       .94        1.39        1.20         +-------+-----------+-----------+------------+------------+  Bilateral ABIs appear essentially unchanged compared to prior study on 04/30/2021. Bilateral TBIs appear decreased compared to prior study on 04/30/2021.  Summary: Right: Resting right ankle-brachial index is within normal range. No evidence of significant right lower extremity arterial disease. The right toe-brachial index is normal. Left: Resting left ankle-brachial index is within normal range. No evidence of significant left lower extremity arterial disease. The left toe-brachial index is normal.  *See table(s) above for measurements and observations.  Electronically signed by [name] MD on 08/04/2021 at 9:17:55 AM.    Final        Assessment & Plan:   1. Pain in left lower leg Today noninvasive studies showed no evidence of significant worsening peripheral arterial disease.  Although her ABIs are slightly lower there is still within the normal range.  The pain itself has resolved.  Patient will keep her previously scheduled follow-up appointment - VAS [place] ABI WITH/WO TBI  2. Hypercholesterolemia Continue statin as ordered and reviewed, no changes at this time   3. Left-sided low back pain with left-sided sciatica, unspecified chronicity Suspect that part of the pain may be related to patient's lower back pain and/or sciatica   Current Outpatient Medications on File Prior to Visit   Medication Sig Dispense Refill   ALPRAZolam (XANAX) 1 MG tablet TAKE 1/2 TO 1 TABLET BY MOUTH TWICE DAILY AS NEEDED FOR ANXIETY 60 tablet 5   aspirin EC 81 MG tablet Take 1 tablet (81 mg total) by mouth daily. 150 tablet 2   atorvastatin (LIPITOR) 10 MG tablet Take 1 tablet (10 mg total) by mouth daily. 30 tablet 11   clopidogrel (PLAVIX) 75 MG tablet Take 1 tablet (75 mg total) by mouth daily. 30 tablet 11   HYDROcodone-acetaminophen (NORCO) 10-325 MG tablet Take 1 tablet by mouth every 8 (eight) hours as needed for moderate pain. 60 tablet 0   fluticasone (FLONASE) 50 MCG/ACT nasal spray Place 2 sprays into both nostrils daily. (Patient not taking: Reported on 07/30/2021) 16 g 6   No current facility-administered medications on file prior to visit.    There are no Patient Instructions on file for this visit. No follow-ups on file.   Kris Hartmann, NP

## 2021-09-24 ENCOUNTER — Encounter: Payer: Self-pay | Admitting: Family Medicine

## 2021-09-24 ENCOUNTER — Other Ambulatory Visit (HOSPITAL_COMMUNITY)
Admission: RE | Admit: 2021-09-24 | Discharge: 2021-09-24 | Disposition: A | Discharge status: Home / Self Care | Payer: Medicare Other | Source: Ambulatory Visit | Attending: Family Medicine | Admitting: Family Medicine

## 2021-09-24 ENCOUNTER — Ambulatory Visit (INDEPENDENT_AMBULATORY_CARE_PROVIDER_SITE_OTHER): Payer: Medicare Other | Admitting: Family Medicine

## 2021-09-24 VITALS — BP 122/75 | HR 70 | Resp 16 | Wt 131.0 lb

## 2021-09-24 DIAGNOSIS — Z1272 Encounter for screening for malignant neoplasm of vagina: Secondary | ICD-10-CM | POA: Insufficient documentation

## 2021-09-24 DIAGNOSIS — W19XXXS Unspecified fall, sequela: Secondary | ICD-10-CM

## 2021-09-24 DIAGNOSIS — M5116 Intervertebral disc disorders with radiculopathy, lumbar region: Secondary | ICD-10-CM | POA: Diagnosis not present

## 2021-09-24 DIAGNOSIS — M1990 Unspecified osteoarthritis, unspecified site: Secondary | ICD-10-CM

## 2021-09-24 DIAGNOSIS — W19XXXA Unspecified fall, initial encounter: Secondary | ICD-10-CM | POA: Insufficient documentation

## 2021-09-24 MED ORDER — HYDROCODONE-ACETAMINOPHEN 10-325 MG PO TABS: 1.0000 | ORAL_TABLET | Freq: Three times a day (TID) | ORAL | 0 refills | Status: DC | PRN

## 2021-09-24 NOTE — Assessment & Plan Note (Signed)
Acute on chronic, may be exacerbated from recent fall Fall in June at service station encourage frequent exercise use of APAP to assist

## 2021-09-24 NOTE — Progress Notes (Signed)
Established patient visit   Patient: Patricia Decker   DOB: [birth year]   66 y.o. Female  MRN: [id number] Visit Date: 09/24/2021  Today's healthcare provider: Gwyneth Sprout, FNP  Re Introduced to nurse practitioner role and practice setting.  All questions answered.  Discussed provider/patient relationship and expectations.   Chief Complaint  Patient presents with   Gynecologic Exam   Subjective    HPI  Patient is here for yearly PAP.  Medications: Outpatient Medications Prior to Visit  Medication Sig   ALPRAZolam (XANAX) 1 MG tablet TAKE 1/2 TO 1 TABLET BY MOUTH TWICE DAILY AS NEEDED FOR ANXIETY   aspirin EC 81 MG tablet Take 1 tablet (81 mg total) by mouth daily.   atorvastatin (LIPITOR) 10 MG tablet Take 1 tablet (10 mg total) by mouth daily.   clopidogrel (PLAVIX) 75 MG tablet Take 1 tablet (75 mg total) by mouth daily.   fluticasone (FLONASE) 50 MCG/ACT nasal spray Place 2 sprays into both nostrils daily. (Patient not taking: Reported on 07/30/2021)   [DISCONTINUED] HYDROcodone-acetaminophen (NORCO) 10-325 MG tablet Take 1 tablet by mouth every 8 (eight) hours as needed for moderate pain.   No facility-administered medications prior to visit.    Review of Systems    Objective    BP 122/75 (BP Location: Left Arm, Patient Position: Sitting, Cuff Size: Normal)   Pulse 70   Resp 16   Wt 131 lb (59.4 kg)   SpO2 98%   BMI 21.80 kg/m    Physical Exam Exam conducted with a chaperone present.  Constitutional:      Appearance: Normal appearance. She is normal weight.  Cardiovascular:     Rate and Rhythm: Normal rate.     Pulses: Normal pulses.  Pulmonary:     Effort: Pulmonary effort is normal.  Genitourinary:    General: Normal vulva.     Exam position: Lithotomy position.     Pubic Area: No rash or pubic lice.      Tanner stage (genital): 5.     Vagina: Normal.     Comments: Surgically absent anatomy; vaginally cell sampling  Musculoskeletal:         General: Normal range of motion.  Skin:    General: Skin is warm and dry.  Neurological:     Mental Status: She is alert.       No results found for any visits on 09/24/21.  Assessment & Plan     Problem List Items Addressed This Visit       Nervous and Auditory   Neuritis or radiculitis due to rupture of lumbar intervertebral disc    Chronic, stable S/p vascular intervention Recent fall in June       Relevant Medications   HYDROcodone-acetaminophen (NORCO) 10-325 MG tablet     Musculoskeletal and Integument   Arthritis    Acute on chronic, may be exacerbated from recent fall Fall in June at service station encourage frequent exercise use of APAP to assist       Relevant Medications   HYDROcodone-acetaminophen (NORCO) 10-325 MG tablet     Other   Fall    Fall in June at service station; was seen in ED following for acute pain OA noted in bilateral hips       Screening for vaginal cancer - Primary    S/p hysterectomy  Request for PAP smear for cancer screening; unsure what was retained  Denies vaginal complaints  Relevant Orders   Cytology - PAP     Return in about 9 months (around 06/26/2022) for annual examination.      Vonna Kotyk, FNP, have reviewed all documentation for this visit. The documentation on 09/24/21 for the exam, diagnosis, procedures, and orders are all accurate and complete.    Gwyneth Sprout, [place] [phone] (phone) [phone] (fax)  [place]

## 2021-09-24 NOTE — Assessment & Plan Note (Signed)
S/p hysterectomy  Request for PAP smear for cancer screening; unsure what was retained  Denies vaginal complaints

## 2021-09-24 NOTE — Assessment & Plan Note (Signed)
Fall in June at service station; was seen in ED following for acute pain OA noted in bilateral hips

## 2021-09-24 NOTE — Assessment & Plan Note (Signed)
Chronic, stable S/p vascular intervention Recent fall in June

## 2021-09-28 LAB — CYTOLOGY - PAP
Chlamydia: NEGATIVE
Comment: NEGATIVE
Comment: NEGATIVE
Comment: NEGATIVE
Comment: NORMAL
Diagnosis: NEGATIVE
HSV1: NEGATIVE
HSV2: NEGATIVE
Neisseria Gonorrhea: NEGATIVE
Trichomonas: NEGATIVE

## 2021-09-28 NOTE — Progress Notes (Signed)
Negative for lesions or malignancy.  Patricia Decker, [address] [place], [place] [address] [phone] (phone) [phone] (fax) [place]

## 2021-11-01 ENCOUNTER — Other Ambulatory Visit: Payer: Self-pay | Admitting: Family Medicine

## 2021-11-01 DIAGNOSIS — F411 Generalized anxiety disorder: Secondary | ICD-10-CM

## 2021-11-01 NOTE — Telephone Encounter (Unsigned)
Copied from [place] [id number]. Topic: General - Other >> Nov 01, 2021  8:33 AM Everette C wrote: Reason for CRM: Medication Refill - Medication: ALPRAZolam (XANAX) 1 MG tablet [[id number]]   Has the patient contacted their pharmacy? Yes.  The patient was uncertain of the medication's status  (Agent: If no, request that the patient contact the pharmacy for the refill. If patient does not wish to contact the pharmacy document the reason why and proceed with request.) (Agent: If yes, when and what did the pharmacy advise?)  Preferred Pharmacy (with phone number or street name): [facility] [place], [place] [address] Phone: [phone] Fax: [phone] Hours: Not open 24 hours   Has the patient been seen for an appointment in the last year OR does the patient have an upcoming appointment? Yes.    Agent: Please be advised that RX refills may take up to 3 business days. We ask that you follow-up with your pharmacy.

## 2021-11-02 NOTE — Telephone Encounter (Signed)
Requested medication (s) are due for refill today:   Provider to review.   Prescribed 11/01/2021  Requested medication (s) are on the active medication list:   Yes  Future visit scheduled:   No  Last seen by Tally Joe   Last ordered: 11/01/2021 #60, 0 refills by Dr. Ky Barban  Returned because it's a non delegated refill   Requested Prescriptions  Pending Prescriptions Disp Refills   ALPRAZolam (XANAX) 1 MG tablet 60 tablet 0    Sig: Take 0.5-1 tablets (0.5-1 mg total) by mouth 2 (two) times daily as needed. for anxiety     Not Delegated - Psychiatry: Anxiolytics/Hypnotics 2 Failed - 11/01/2021  1:45 PM      Failed - This refill cannot be delegated      Failed - Urine Drug Screen completed in last 360 days      Passed - Patient is not pregnant      Passed - Valid encounter within last 6 months    Recent Outpatient Visits           1 month ago Screening for vaginal cancer   [facility] Tally Joe T, FNP   4 months ago Seasonal allergic rhinitis due to pollen   [facility] Tally Joe T, FNP   7 months ago Acute recurrent pansinusitis   [facility] Tally Joe T, FNP   9 months ago PAD (peripheral artery disease) [facility])   [facility] Tally Joe T, FNP   1 year ago Neuritis or radiculitis due to rupture of lumbar intervertebral disc   [facility] Birdie Sons, MD

## 2021-11-03 MED ORDER — ALPRAZOLAM 1 MG PO TABS: 0.5000 mg | ORAL_TABLET | Freq: Two times a day (BID) | ORAL | 0 refills | Status: DC | PRN

## 2021-11-23 ENCOUNTER — Telehealth: Payer: Self-pay | Admitting: *Deleted

## 2021-11-23 NOTE — Patient Outreach (Signed)
  Care Coordination   11/23/2021 Name: Patricia Decker MRN: [id number] DOB: [birth year]   Care Coordination Outreach Attempts:  An unsuccessful telephone outreach was attempted today to offer the patient information about available care coordination services as a benefit of their health plan.   Follow Up Plan:  Additional outreach attempts will be made to offer the patient care coordination information and services.   Encounter Outcome:  No Answer  Care Coordination Interventions Activated:  Yes   Care Coordination Interventions:  No, not indicated    [facility] [phone]

## 2021-12-28 ENCOUNTER — Other Ambulatory Visit (INDEPENDENT_AMBULATORY_CARE_PROVIDER_SITE_OTHER): Payer: Self-pay | Admitting: Vascular Surgery

## 2021-12-31 ENCOUNTER — Telehealth: Payer: Self-pay | Admitting: *Deleted

## 2021-12-31 NOTE — Patient Outreach (Signed)
  Care Coordination   12/31/2021 Name: Patricia Decker MRN: [id number] DOB: 17-Oct-1955   Care Coordination Outreach Attempts:  A second unsuccessful outreach was attempted today to offer the patient with information about available care coordination services as a benefit of their health plan.     Follow Up Plan:  Additional outreach attempts will be made to offer the patient care coordination information and services.   Encounter Outcome:  No Answer  Care Coordination Interventions Activated:  No   Care Coordination Interventions:  No, not indicated    Valente David, RN, MSN, [facility] Care Management Care Management Coordinator [phone]

## 2022-01-06 ENCOUNTER — Telehealth: Payer: Self-pay | Admitting: *Deleted

## 2022-01-06 NOTE — Patient Outreach (Signed)
  Care Coordination   Initial Visit Note   01/06/2022 Name: GRATIA DISLA MRN: [id number] DOB: Jan 05, 1956  Jed Limerick Hankins is a 66 y.o. year old female who sees Gwyneth Sprout, FNP for primary care. I spoke with  August Albino by phone today.  What matters to the patients health and wellness today?  State this is not a good time, state she will call back.       SDOH assessments and interventions completed:  No     Care Coordination Interventions Activated:  No  Care Coordination Interventions:  No, not indicated   Follow up plan:  Will open case only if patient call back as this is 3rd attempt to engage.    Encounter Outcome:  Pt. Request to Call [place], RN, MSN, [place] [facility] Care Management Coordinator [phone]

## 2022-02-04 ENCOUNTER — Encounter (INDEPENDENT_AMBULATORY_CARE_PROVIDER_SITE_OTHER): Payer: Medicare Other

## 2022-02-04 ENCOUNTER — Ambulatory Visit (INDEPENDENT_AMBULATORY_CARE_PROVIDER_SITE_OTHER): Payer: Medicare Other | Admitting: Vascular Surgery

## 2022-03-04 ENCOUNTER — Ambulatory Visit: Payer: Self-pay

## 2022-03-04 ENCOUNTER — Ambulatory Visit: Payer: Medicare Other | Admitting: Family Medicine

## 2022-03-04 NOTE — Telephone Encounter (Signed)
Message from [employer] sent at 03/04/2022 10:30 AM EST  Summary: sinus discomfort / rx req   The patient would like to be prescribed something for their sinus discomfort  The patient shares that they have experienced symptoms since yesterday 03/03/22  Please contact the patient further when possible        Attempted to call pt but no VM to LM. Just rang.

## 2022-03-04 NOTE — Progress Notes (Deleted)
     I,Sha'taria Lizzie Cokley,acting as a Education administrator for Gwyneth Sprout, FNP.,have documented all relevant documentation on the behalf of Gwyneth Sprout, FNP,as directed by  Gwyneth Sprout, FNP while in the presence of Gwyneth Sprout, FNP.   Established patient visit   Patient: Patricia Decker   DOB: 1955-10-12   66 y.o. Female  MRN: [id number] Visit Date: 03/04/2022  Today's healthcare provider: Gwyneth Sprout, FNP   No chief complaint on file.  Subjective    HPI  Cough: Patient complains of cough. Symptoms began {numbers; 0-10:33138} {time units w/ wo plural:11} ago. Cough described as {cough description:5714::"non-productive","without wheezing, dyspnea or hemoptysis"}. Patient denies {respiratory symptoms:16811}. Associated symptoms include {respiratory symptoms:16811}. Patient denies {respiratory symptoms:16811}.  Patient has a history of {croup hx:16560}. Current treatments have included {croup med:16561}, with {exc, good fair, poor:33178} improvement.  Patient {has/denies:5300} have tobacco smoke exposure.   Medications: Outpatient Medications Prior to Visit  Medication Sig   ALPRAZolam (XANAX) 1 MG tablet Take 0.5-1 tablets (0.5-1 mg total) by mouth 2 (two) times daily as needed. for anxiety   aspirin EC 81 MG tablet Take 1 tablet (81 mg total) by mouth daily.   atorvastatin (LIPITOR) 10 MG tablet TAKE 1 TABLET BY MOUTH ONCE EVERY EVENING   clopidogrel (PLAVIX) 75 MG tablet TAKE 1 TABLET BY MOUTH ONCE DAILY   fluticasone (FLONASE) 50 MCG/ACT nasal spray Place 2 sprays into both nostrils daily. (Patient not taking: Reported on 07/30/2021)   HYDROcodone-acetaminophen (NORCO) 10-325 MG tablet Take 1 tablet by mouth every 8 (eight) hours as needed for moderate pain.   No facility-administered medications prior to visit.    Review of Systems  {Labs  Heme  Chem  Endocrine  Serology  Results Review (optional):23779}   Objective    There were no vitals taken for this visit. {Show previous  vital signs (optional):23777}  Physical Exam  ***  No results found for any visits on 03/04/22.  Assessment & Plan     ***  No follow-ups on file.      {provider attestation***:1}   Gwyneth Sprout, [place] [phone] (phone) [phone] (fax)  [place]

## 2022-03-04 NOTE — Telephone Encounter (Signed)
Pt called, unable to leave VM d/t line just keeps ringing.   Summary: sinus discomfort / rx req   The patient would like to be prescribed something for their sinus discomfort  The patient shares that they have experienced symptoms since yesterday 03/03/22  Please contact the patient further when possible

## 2022-03-04 NOTE — Telephone Encounter (Signed)
This is the 3rd attempt to return this pt's call without success.   Per policy I have forwarded it to [facility].

## 2022-03-09 ENCOUNTER — Encounter: Payer: Self-pay | Admitting: Family Medicine

## 2022-03-09 ENCOUNTER — Ambulatory Visit (INDEPENDENT_AMBULATORY_CARE_PROVIDER_SITE_OTHER): Payer: Medicare Other | Admitting: Family Medicine

## 2022-03-09 VITALS — BP 139/79 | HR 75 | Temp 99.3°F | Resp 16 | Ht 65.0 in | Wt 129.9 lb

## 2022-03-09 DIAGNOSIS — J0141 Acute recurrent pansinusitis: Secondary | ICD-10-CM | POA: Diagnosis not present

## 2022-03-09 MED ORDER — PREDNISONE 50 MG PO TABS: 50.0000 mg | ORAL_TABLET | Freq: Every day | ORAL | 0 refills | Status: DC

## 2022-03-09 MED ORDER — AZITHROMYCIN 500 MG PO TABS: 500.0000 mg | ORAL_TABLET | Freq: Every day | ORAL | 0 refills | Status: DC

## 2022-03-09 MED ORDER — GUAIFENESIN-DM 100-10 MG/5ML PO SYRP: 5.0000 mL | ORAL_SOLUTION | ORAL | 0 refills | Status: DC | PRN

## 2022-03-09 NOTE — Progress Notes (Signed)
I,Joseline E Rosas,acting as a scribe for Gwyneth Sprout, FNP.,have documented all relevant documentation on the behalf of Gwyneth Sprout, FNP,as directed by  Gwyneth Sprout, FNP while in the presence of Gwyneth Sprout, FNP.   Established patient visit   Patient: Patricia Decker   DOB: 08/04/1955   67 y.o. Female  MRN: [id number] Visit Date: 03/09/2022  Today's healthcare provider: Gwyneth Sprout, FNP  Re Introduced to nurse practitioner role and practice setting.  All questions answered.  Discussed provider/patient relationship and expectations.  Chief Complaint  Patient presents with   URI   Subjective    URI  This is a new problem. The current episode started in the past 7 days (Friday night). The problem has been gradually worsening. There has been no fever. Associated symptoms include congestion, coughing, headaches and wheezing. Pertinent negatives include no abdominal pain, chest pain, plugged ear sensation, rhinorrhea, sinus pain, sneezing or sore throat. Treatments tried: Honey.  Reports that she has started to feel better.  Medications: Outpatient Medications Prior to Visit  Medication Sig   aspirin EC 81 MG tablet Take 1 tablet (81 mg total) by mouth daily.   atorvastatin (LIPITOR) 10 MG tablet TAKE 1 TABLET BY MOUTH ONCE EVERY EVENING   clopidogrel (PLAVIX) 75 MG tablet TAKE 1 TABLET BY MOUTH ONCE DAILY   fluticasone (FLONASE) 50 MCG/ACT nasal spray Place 2 sprays into both nostrils daily.   [DISCONTINUED] ALPRAZolam (XANAX) 1 MG tablet Take 0.5-1 tablets (0.5-1 mg total) by mouth 2 (two) times daily as needed. for anxiety   [DISCONTINUED] HYDROcodone-acetaminophen (NORCO) 10-325 MG tablet Take 1 tablet by mouth every 8 (eight) hours as needed for moderate pain.   No facility-administered medications prior to visit.    Review of Systems  HENT:  Positive for congestion. Negative for rhinorrhea, sinus pain, sneezing and sore throat.   Respiratory:  Positive for cough and  wheezing.   Cardiovascular:  Negative for chest pain.  Gastrointestinal:  Negative for abdominal pain.  Neurological:  Positive for headaches.     Objective    BP 139/79 (BP Location: Right Arm, Patient Position: Sitting, Cuff Size: Normal)   Pulse 75   Temp 99.3 F (37.4 C) (Oral)   Resp 16   Ht '5\' 5"'$  (1.651 m)   Wt 129 lb 14.4 oz (58.9 kg)   SpO2 100%   BMI 21.62 kg/m   Physical Exam Vitals and nursing note reviewed.  Constitutional:      General: She is not in acute distress.    Appearance: Normal appearance. She is normal weight. She is not ill-appearing, toxic-appearing or diaphoretic.  HENT:     Head: Normocephalic and atraumatic.     Right Ear: Tympanic membrane, ear canal and external ear normal.     Left Ear: Tympanic membrane, ear canal and external ear normal.     Nose: Nose normal. No congestion or rhinorrhea.     Mouth/Throat:     Mouth: Mucous membranes are moist.     Pharynx: Oropharynx is clear. No oropharyngeal exudate or posterior oropharyngeal erythema.  Eyes:     Extraocular Movements: Extraocular movements intact.     Conjunctiva/sclera: Conjunctivae normal.     Pupils: Pupils are equal, round, and reactive to light.  Cardiovascular:     Rate and Rhythm: Normal rate and regular rhythm.     Pulses: Normal pulses.     Heart sounds: Normal heart sounds. No murmur heard.  No friction rub. No gallop.  Pulmonary:     Effort: Pulmonary effort is normal. No respiratory distress.     Breath sounds: No stridor. Wheezing and rhonchi present. No rales.  Chest:     Chest wall: No tenderness.  Abdominal:     Palpations: Abdomen is soft.  Musculoskeletal:        General: No swelling, tenderness, deformity or signs of injury. Normal range of motion.     Right lower leg: No edema.     Left lower leg: No edema.  Skin:    General: Skin is warm and dry.     Capillary Refill: Capillary refill takes less than 2 seconds.     Coloration: Skin is not jaundiced or  pale.     Findings: No bruising, erythema, lesion or rash.  Neurological:     General: No focal deficit present.     Mental Status: She is alert and oriented to person, place, and time. Mental status is at baseline.     Cranial Nerves: No cranial nerve deficit.     Sensory: No sensory deficit.     Motor: No weakness.     Coordination: Coordination normal.  Psychiatric:        Mood and Affect: Mood normal.        Behavior: Behavior normal.        Thought Content: Thought content normal.        Judgment: Judgment normal.     No results found for any visits on 03/09/22.  Assessment & Plan     Problem List Items Addressed This Visit       Respiratory   Acute recurrent pansinusitis - Primary    Acute, stable; has improved in last 3 days Last year similar symptoms Recommend use of ABX given active wheezing and rhonchi with use of cough syrup and burst of prednisone  Continue OTC supportive medications to assist RTC in 1 week if symptoms worsen Continue to drink water daily.  Goal of 64 oz/day minimum. Can use OTC allergy medication, ex Zyrtec as well as nasal steroid, ex Flonase. If you are congested you can use Saline Nasal Spray.   You can use medications for cough like Delsym and for mucus, like Mucinex. Can add lozenges or drink tea with local honey to help relieve symptoms.       Relevant Medications   guaiFENesin-dextromethorphan (ROBITUSSIN DM) 100-10 MG/5ML syrup   predniSONE (DELTASONE) 50 MG tablet   azithromycin (ZITHROMAX) 500 MG tablet   Return in about 1 week (around 03/16/2022), or if symptoms worsen or fail to improve.     Vonna Kotyk, FNP, have reviewed all documentation for this visit. The documentation on 03/09/22 for the exam, diagnosis, procedures, and orders are all accurate and complete.  Gwyneth Sprout, [place] [phone] (phone) [phone] (fax)  [place]

## 2022-03-09 NOTE — Assessment & Plan Note (Signed)
Acute, stable; has improved in last 3 days Last year similar symptoms Recommend use of ABX given active wheezing and rhonchi with use of cough syrup and burst of prednisone  Continue OTC supportive medications to assist RTC in 1 week if symptoms worsen Continue to drink water daily.  Goal of 64 oz/day minimum. Can use OTC allergy medication, ex Zyrtec as well as nasal steroid, ex Flonase. If you are congested you can use Saline Nasal Spray.   You can use medications for cough like Delsym and for mucus, like Mucinex. Can add lozenges or drink tea with local honey to help relieve symptoms.

## 2022-03-09 NOTE — Patient Instructions (Signed)
Antibiotics, Cough syrup,  Steroids called in; let us know if symptoms continue >1 week course of treatment  The CDC recommends two doses of Shingrix (the new shingles vaccine) separated by 2 to 6 months for adults age 65 years and older. I recommend checking with your insurance plan regarding coverage for this vaccine.  Schedule with your pharmacy once you are feeling well.

## 2022-03-16 ENCOUNTER — Ambulatory Visit: Payer: Medicare Other | Admitting: Family Medicine

## 2022-03-16 ENCOUNTER — Other Ambulatory Visit (INDEPENDENT_AMBULATORY_CARE_PROVIDER_SITE_OTHER): Payer: Self-pay | Admitting: Nurse Practitioner

## 2022-03-16 DIAGNOSIS — I739 Peripheral vascular disease, unspecified: Secondary | ICD-10-CM

## 2022-03-16 NOTE — Progress Notes (Deleted)
      Established patient visit   Patient: Patricia Decker   DOB: February 09, 1956   67 y.o. Female  MRN: [id number] Visit Date: 03/16/2022  Today's healthcare provider: Gwyneth Sprout, FNP   No chief complaint on file.  Subjective    Back Pain     Medications: Outpatient Medications Prior to Visit  Medication Sig  . aspirin EC 81 MG tablet Take 1 tablet (81 mg total) by mouth daily.  Marland Kitchen atorvastatin (LIPITOR) 10 MG tablet TAKE 1 TABLET BY MOUTH ONCE EVERY EVENING  . azithromycin (ZITHROMAX) 500 MG tablet Take 1 tablet (500 mg total) by mouth daily.  . clopidogrel (PLAVIX) 75 MG tablet TAKE 1 TABLET BY MOUTH ONCE DAILY  . fluticasone (FLONASE) 50 MCG/ACT nasal spray Place 2 sprays into both nostrils daily.  Marland Kitchen guaiFENesin-dextromethorphan (ROBITUSSIN DM) 100-10 MG/5ML syrup Take 5 mLs by mouth every 4 (four) hours as needed for cough.  . predniSONE (DELTASONE) 50 MG tablet Take 1 tablet (50 mg total) by mouth daily with breakfast.   No facility-administered medications prior to visit.    Review of Systems  Musculoskeletal:  Positive for back pain.   {Labs  Heme  Chem  Endocrine  Serology  Results Review (optional):23779}   Objective    There were no vitals taken for this visit. {Show previous vital signs (optional):23777}  Physical Exam  ***  No results found for any visits on 03/16/22.  Assessment & Plan     ***  No follow-ups on file.      {provider attestation***:1}   Gwyneth Sprout, [place] [phone] (phone) [phone] (fax)  [place]

## 2022-03-17 ENCOUNTER — Ambulatory Visit (INDEPENDENT_AMBULATORY_CARE_PROVIDER_SITE_OTHER): Payer: Medicare Other | Admitting: Family Medicine

## 2022-03-17 ENCOUNTER — Ambulatory Visit
Admission: RE | Admit: 2022-03-17 | Discharge: 2022-03-17 | Disposition: A | Discharge status: Home / Self Care | Payer: Medicare Other | Attending: Family Medicine | Admitting: Family Medicine

## 2022-03-17 ENCOUNTER — Ambulatory Visit
Admission: RE | Admit: 2022-03-17 | Discharge: 2022-03-17 | Disposition: A | Discharge status: Home / Self Care | Payer: Medicare Other | Source: Ambulatory Visit | Attending: Family Medicine | Admitting: Family Medicine

## 2022-03-17 ENCOUNTER — Encounter: Payer: Self-pay | Admitting: Family Medicine

## 2022-03-17 VITALS — BP 125/63 | HR 61 | Ht 65.0 in | Wt 133.0 lb

## 2022-03-17 DIAGNOSIS — J208 Acute bronchitis due to other specified organisms: Secondary | ICD-10-CM

## 2022-03-17 DIAGNOSIS — J209 Acute bronchitis, unspecified: Secondary | ICD-10-CM | POA: Diagnosis not present

## 2022-03-17 DIAGNOSIS — B9689 Other specified bacterial agents as the cause of diseases classified elsewhere: Secondary | ICD-10-CM | POA: Insufficient documentation

## 2022-03-17 DIAGNOSIS — R062 Wheezing: Secondary | ICD-10-CM | POA: Diagnosis not present

## 2022-03-17 DIAGNOSIS — J0141 Acute recurrent pansinusitis: Secondary | ICD-10-CM

## 2022-03-17 DIAGNOSIS — R059 Cough, unspecified: Secondary | ICD-10-CM | POA: Diagnosis not present

## 2022-03-17 MED ORDER — PREDNISONE 10 MG PO TABS: ORAL_TABLET | ORAL | 0 refills | Status: DC

## 2022-03-17 MED ORDER — ALBUTEROL SULFATE HFA 108 (90 BASE) MCG/ACT IN AERS: 2.0000 | INHALATION_SPRAY | Freq: Four times a day (QID) | RESPIRATORY_TRACT | 0 refills | Status: DC | PRN

## 2022-03-17 MED ORDER — HYDROCODONE-ACETAMINOPHEN 10-325 MG PO TABS: 1.0000 | ORAL_TABLET | Freq: Four times a day (QID) | ORAL | 0 refills | Status: AC | PRN

## 2022-03-17 MED ORDER — DOXYCYCLINE HYCLATE 100 MG PO TABS: 100.0000 mg | ORAL_TABLET | Freq: Two times a day (BID) | ORAL | 0 refills | Status: DC

## 2022-03-17 MED ORDER — CEFPODOXIME PROXETIL 200 MG PO TABS: 200.0000 mg | ORAL_TABLET | Freq: Two times a day (BID) | ORAL | 0 refills | Status: DC

## 2022-03-17 NOTE — Assessment & Plan Note (Addendum)
Not improved in lung sounds; sinus symptoms improved Coughing with accessory muscle use throughout  Worse with bending and laying down Repeat ABX, add albuterol, extend prednisone  Repeat CXR

## 2022-03-17 NOTE — Progress Notes (Signed)
Date:  03/17/2022   Name:  Patricia Decker   DOB:  [birth year]   MRN:  [id number]  Re Introduced to nurse practitioner role and practice setting.  All questions answered.  Discussed provider/patient relationship and expectations.  Chief Complaint: Back Pain  Back Pain This is a new problem. Episode onset: 2-3 days. The problem occurs constantly. Pain location: between shoulder blades. The pain does not radiate. The pain is at a severity of 1/10. The pain is mild. The pain is Worse during the night. The symptoms are aggravated by coughing and bending. (Cough ) Treatments tried: pt had 1 hydrocodone that she took for pain. The treatment provided moderate relief.    Lab Results  Component Value Date   NA 140 05/06/2020   K 4.5 05/06/2020   CO2 20 05/06/2020   GLUCOSE 98 05/06/2020   BUN 13 04/02/2021   CREATININE 0.69 04/02/2021   CALCIUM 9.7 05/06/2020   EGFR 76 05/06/2020   GFRNONAA >60 04/02/2021   Lab Results  Component Value Date   CHOL 224 (H) 05/06/2020   HDL 63 05/06/2020   LDLCALC 146 (H) 05/06/2020   TRIG 85 05/06/2020   CHOLHDL 3.1 03/21/2019   Lab Results  Component Value Date   TSH 0.891 05/06/2020   Lab Results  Component Value Date   WBC 7.4 05/06/2020   HGB 11.5 05/06/2020   HCT 35.9 05/06/2020   MCV 84 05/06/2020   PLT 430 05/06/2020   Lab Results  Component Value Date   ALT 10 05/06/2020   AST 21 05/06/2020   ALKPHOS 74 05/06/2020   BILITOT <0.2 05/06/2020   Review of Systems  Musculoskeletal:  Positive for back pain.    Patient Active Problem List   Diagnosis Date Noted   Acute bacterial bronchitis 03/17/2022   Wheezing 03/17/2022   PAD (peripheral artery disease) ([place]) 01/18/2021   Scoliosis of lumbar spine 10/09/2019   Absolute anemia 11/05/2008   Clinical depression 08/26/2008    Allergies  Allergen Reactions   Penicillins Other (See Comments)   Sulfa Antibiotics     Indigestion   Latex Rash    Gloves irritate hands when worn     Past Surgical History:  Procedure Laterality Date   ABDOMINAL HYSTERECTOMY     APPENDECTOMY     CATARACT EXTRACTION, BILATERAL     CHOLECYSTECTOMY     COLONOSCOPY WITH PROPOFOL N/A 06/30/2016   Procedure: COLONOSCOPY WITH PROPOFOL;  Surgeon: Jonathon Bellows, MD;  Location: [facility];  Service: Endoscopy;  Laterality: N/A;   EAR CYST EXCISION Left 07/31/2019   Procedure: EXCISION EAR LESION;  Surgeon: Carloyn Manner, MD;  Location: [place];  Service: ENT;  Laterality: Left;  Latex   LOWER EXTREMITY ANGIOGRAPHY Left 04/02/2021   Procedure: LOWER EXTREMITY ANGIOGRAPHY;  Surgeon: Algernon Huxley, MD;  Location: [place] [facility];  Service: Cardiovascular;  Laterality: Left;    Social History   Tobacco Use   Smoking status: Never   Smokeless tobacco: Never  Vaping Use   Vaping Use: Never used  Substance Use Topics   Alcohol use: No   Drug use: No     Medication list has been reviewed and updated.  Current Meds  Medication Sig   albuterol (VENTOLIN HFA) 108 (90 Base) MCG/ACT inhaler Inhale 2 puffs into the lungs every 6 (six) hours as needed for wheezing or shortness of breath.   aspirin EC 81 MG tablet Take 1 tablet (81 mg total) by mouth  daily.   atorvastatin (LIPITOR) 10 MG tablet TAKE 1 TABLET BY MOUTH ONCE EVERY EVENING   cefpodoxime (VANTIN) 200 MG tablet Take 1 tablet (200 mg total) by mouth 2 (two) times daily.   clopidogrel (PLAVIX) 75 MG tablet TAKE 1 TABLET BY MOUTH ONCE DAILY   doxycycline (VIBRA-TABS) 100 MG tablet Take 1 tablet (100 mg total) by mouth 2 (two) times daily.   fluticasone (FLONASE) 50 MCG/ACT nasal spray Place 2 sprays into both nostrils daily.   HYDROcodone-acetaminophen (NORCO) 10-325 MG tablet Take 1 tablet by mouth every 6 (six) hours as needed for up to 5 days.   predniSONE (DELTASONE) 10 MG tablet Day 1 & 2 take 6 tablets Day 3 &4 take 5 tablets Day 5 &6 take 4 tablets Day 7 & 8 take 3 tablets Day 9 & 10 take 2 tablets Day 11 & 12  take 1 tablet Day 13 & 14 take 1/2 tablet        No data to display             03/17/2022    3:08 PM 06/28/2021   10:35 AM 05/06/2020    1:33 PM  Depression screen PHQ 2/9  Decreased Interest 0 0 0  Down, Depressed, Hopeless 0 0 0  PHQ - 2 Score 0 0 0  Altered sleeping 0  0  Tired, decreased energy 0  0  Change in appetite 0  0  Feeling bad or failure about yourself  0  0  Trouble concentrating 0  0  Moving slowly or fidgety/restless 0  0  Suicidal thoughts 0  0  PHQ-9 Score 0  0  Difficult doing work/chores Not difficult at all  Not difficult at all    BP Readings from Last 3 Encounters:  03/17/22 125/63  03/09/22 139/79  09/24/21 122/75    Physical Exam Vitals and nursing note reviewed.  Constitutional:      General: She is not in acute distress.    Appearance: Normal appearance. She is normal weight. She is not ill-appearing, toxic-appearing or diaphoretic.  HENT:     Head: Normocephalic and atraumatic.     Right Ear: Tympanic membrane, ear canal and external ear normal.     Left Ear: Tympanic membrane, ear canal and external ear normal.  Cardiovascular:     Rate and Rhythm: Normal rate and regular rhythm.     Pulses: Normal pulses.     Heart sounds: Normal heart sounds. No murmur heard.    No friction rub. No gallop.  Pulmonary:     Effort: Accessory muscle usage present. No respiratory distress.     Breath sounds: Decreased air movement present. No stridor. Examination of the right-upper field reveals wheezing and rhonchi. Examination of the left-upper field reveals wheezing and rhonchi. Examination of the right-middle field reveals wheezing and rhonchi. Examination of the left-middle field reveals wheezing and rhonchi. Examination of the right-lower field reveals decreased breath sounds, wheezing and rhonchi. Examination of the left-lower field reveals decreased breath sounds, wheezing and rhonchi. Decreased breath sounds, wheezing and rhonchi present. No rales.   Chest:     Chest wall: No tenderness.  Abdominal:     General: Bowel sounds are normal.     Palpations: Abdomen is soft.     Tenderness: There is no right CVA tenderness or left CVA tenderness.  Musculoskeletal:        General: No swelling, tenderness, deformity or signs of injury. Normal range of motion.  Right lower leg: No edema.     Left lower leg: No edema.  Skin:    General: Skin is warm and dry.     Capillary Refill: Capillary refill takes less than 2 seconds.     Coloration: Skin is not jaundiced or pale.     Findings: No bruising, erythema, lesion or rash.  Neurological:     General: No focal deficit present.     Mental Status: She is alert and oriented to person, place, and time. Mental status is at baseline.     Cranial Nerves: No cranial nerve deficit.     Sensory: No sensory deficit.     Motor: No weakness.     Coordination: Coordination normal.  Psychiatric:        Mood and Affect: Mood normal.        Behavior: Behavior normal.        Thought Content: Thought content normal.        Judgment: Judgment normal.     Wt Readings from Last 3 Encounters:  03/17/22 133 lb (60.3 kg)  03/09/22 129 lb 14.4 oz (58.9 kg)  09/24/21 131 lb (59.4 kg)    BP 125/63   Pulse 61   Ht '5\' 5"'$  (1.651 m)   Wt 133 lb (60.3 kg)   SpO2 100%   BMI 22.13 kg/m   Assessment and Plan:  Problem List Items Addressed This Visit       Respiratory   Acute bacterial bronchitis - Primary    Not improved in lung sounds; sinus symptoms improved Coughing with accessory muscle use throughout  Worse with bending and laying down Repeat ABX, add albuterol, extend prednisone  Repeat CXR      Relevant Medications   cefpodoxime (VANTIN) 200 MG tablet   doxycycline (VIBRA-TABS) 100 MG tablet   albuterol (VENTOLIN HFA) 108 (90 Base) MCG/ACT inhaler   predniSONE (DELTASONE) 10 MG tablet   HYDROcodone-acetaminophen (NORCO) 10-325 MG tablet   Other Relevant Orders   DG Chest 2 View    RESOLVED: Acute recurrent pansinusitis    Acute, stable; has improved in last 3 days Last year similar symptoms Recommend use of ABX given active wheezing and rhonchi with use of cough syrup and burst of prednisone  Continue OTC supportive medications to assist RTC in 1 week if symptoms worsen Continue to drink water daily.  Goal of 64 oz/day minimum. Can use OTC allergy medication, ex Zyrtec as well as nasal steroid, ex Flonase. If you are congested you can use Saline Nasal Spray.   You can use medications for cough like Delsym and for mucus, like Mucinex. Can add lozenges or drink tea with local honey to help relieve symptoms.       Relevant Medications   cefpodoxime (VANTIN) 200 MG tablet   doxycycline (VIBRA-TABS) 100 MG tablet   predniSONE (DELTASONE) 10 MG tablet     Other   Wheezing    Acute, stable Normal SPO2 Recommend use of prednisone and albuterol prn       Relevant Medications   albuterol (VENTOLIN HFA) 108 (90 Base) MCG/ACT inhaler   predniSONE (DELTASONE) 10 MG tablet   Other Relevant Orders   DG Chest 2 View   I, Gwyneth Sprout, FNP, have reviewed all documentation for this visit. The documentation on 03/17/22 for the exam, diagnosis, procedures, and orders are all accurate and complete.

## 2022-03-17 NOTE — Assessment & Plan Note (Signed)
Acute, stable Normal SPO2 Recommend use of prednisone and albuterol prn

## 2022-03-17 NOTE — Assessment & Plan Note (Signed)
Acute, stable; has improved in last 3 days Last year similar symptoms Recommend use of ABX given active wheezing and rhonchi with use of cough syrup and burst of prednisone  Continue OTC supportive medications to assist RTC in 1 week if symptoms worsen Continue to drink water daily.  Goal of 64 oz/day minimum. Can use OTC allergy medication, ex Zyrtec as well as nasal steroid, ex Flonase. If you are congested you can use Saline Nasal Spray.   You can use medications for cough like Delsym and for mucus, like Mucinex. Can add lozenges or drink tea with local honey to help relieve symptoms.

## 2022-03-18 ENCOUNTER — Ambulatory Visit (INDEPENDENT_AMBULATORY_CARE_PROVIDER_SITE_OTHER): Payer: Medicare Other | Admitting: Vascular Surgery

## 2022-03-18 ENCOUNTER — Encounter (INDEPENDENT_AMBULATORY_CARE_PROVIDER_SITE_OTHER): Payer: Medicare Other

## 2022-03-21 NOTE — Progress Notes (Signed)
CXR confirms no pneumonia and positive bronchitis.

## 2022-03-29 ENCOUNTER — Other Ambulatory Visit: Payer: Self-pay | Admitting: Family Medicine

## 2022-03-29 DIAGNOSIS — Z1231 Encounter for screening mammogram for malignant neoplasm of breast: Secondary | ICD-10-CM

## 2022-04-12 ENCOUNTER — Ambulatory Visit (INDEPENDENT_AMBULATORY_CARE_PROVIDER_SITE_OTHER): Payer: Medicare Other

## 2022-04-12 ENCOUNTER — Ambulatory Visit (INDEPENDENT_AMBULATORY_CARE_PROVIDER_SITE_OTHER): Payer: Medicare Other | Admitting: Vascular Surgery

## 2022-04-12 ENCOUNTER — Encounter (INDEPENDENT_AMBULATORY_CARE_PROVIDER_SITE_OTHER): Payer: Self-pay | Admitting: Vascular Surgery

## 2022-04-12 VITALS — BP 136/79 | HR 60 | Resp 16 | Wt 129.2 lb

## 2022-04-12 DIAGNOSIS — I739 Peripheral vascular disease, unspecified: Secondary | ICD-10-CM

## 2022-04-12 DIAGNOSIS — Z9889 Other specified postprocedural states: Secondary | ICD-10-CM | POA: Diagnosis not present

## 2022-04-12 NOTE — Patient Instructions (Signed)
Peripheral Vascular Disease  Peripheral vascular disease (PVD) is a disease of the blood vessels that carry blood from the heart to the rest of the body. PVD is also called peripheral artery disease (PAD) or poor circulation. PVD affects most of the body. But it affects the legs and feet the most. PVD can lead to acute limb ischemia. This happens when there is a sudden stop of blood flow to an arm or leg. This is a medical emergency. What are the causes? The most common cause of PVD is a buildup of a fatty substance (plaque) inside your arteries. This decreases blood flow. Plaque can break off and block blood in a smaller artery. This can lead to acute limb ischemia. Other common causes of PVD include: Blood clots inside the blood vessels. Injuries to blood vessels. Irritation and swelling of blood vessels. Sudden tightening of the blood vessel (spasms). What increases the risk? A family history of PVD. Medical conditions, including: High cholesterol. Diabetes. High blood pressure. Heart disease. Past problems with blood clots. Past injury, such as burns or a broken bone. Other conditions, such as: Buerger's disease. This is caused by swollen or irritated blood vessels in your hands and feet. Arthritis. Birth defects that affect the arteries in your legs. Kidney disease. Using tobacco or nicotine products. Not getting enough exercise. Being very overweight (obese). Being 50 years old or older. What are the signs or symptoms? Cramps in your butt, legs, and feet. Pain and weakness in your legs when you are active that goes away when you rest. Leg pain when at rest. Leg numbness, tingling, or weakness. Coldness in a leg or foot, especially when compared with the other leg or foot. Skin or hair changes. These can include: Hair loss. Shiny skin. Pale or bluish skin. Thick toenails. Being unable to get or keep an erection. Tiredness (fatigue). Weak pulse or no pulse in the  feet. Wounds and sores on the toes, feet, or legs. These take longer to heal. How is this treated? Underlying causes are treated first. Other conditions, like diabetes, high cholesterol, and blood pressure, are also treated. Treatment may include: Lifestyle changes, such as: Quitting smoking. Getting regular exercise. Having a diet low in fat and cholesterol. Not drinking alcohol. Taking medicines, such as: Blood thinners. Medicines to improve blood flow. Medicines to improve your blood cholesterol. Procedures to: Open the arteries and restore blood flow. Insert a small mesh tube (stent) to keep a blocked vessel open. Create a new path for blood to flow to the body (peripheral bypass). Remove dead tissue from a wound. Remove an affected leg or arm. Follow these instructions at home: Medicines Take over-the-counter and prescription medicines only as told by your doctor. If you are taking blood thinners: Talk with your doctor before you take any medicines that have aspirin, or NSAIDs, such as ibuprofen. Take medicines exactly as told. Take them at the same time each day. Avoid doing things that could hurt or bruise you. Take action to prevent falls. Wear an alert bracelet or carry a card that shows you are taking blood thinners. Lifestyle     Get regular exercise. Ask your doctor about how to stay active. Talk with your doctor about keeping a healthy weight. If needed, ask about losing weight. Eat a diet that is low in fat and cholesterol. If you need help, talk with your doctor. Do not drink alcohol. Do not smoke or use any products that contain nicotine or tobacco. If you need help   quitting, ask your doctor. General instructions Take good care of your feet. To do this: Wear shoes that fit well and feel good. Check your feet often for any cuts or sores. Get a flu shot (influenza vaccine) each year. Keep all follow-up visits. Where to find more information Society for  Vascular Surgery: vascular.org American Heart Association: heart.org National Heart, Lung, and Blood Institute: nhlbi.nih.gov Contact a doctor if: You have cramps in your legs when you walk. You have leg pain when you rest. Your leg or foot feels cold. Your skin changes. You cannot get or keep an erection. You have cuts or sores on your legs or feet that do not heal. Get help right away if: You have sudden changes in the color and feeling of your arms or legs, such as: Your arm or leg turns cold, numb, and blue. Your arm or leg becomes red, warm, swollen, painful, or numb. You have any signs of a stroke. "BE FAST" is an easy way to remember the main warning signs: B - Balance. Dizziness, sudden trouble walking, or loss of balance. E - Eyes. Trouble seeing or a change in how you see. F - Face. Sudden weakness or loss of feeling of the face. The face or eyelid may droop on one side. A - Arms. Weakness or loss of feeling in an arm. This happens all of a sudden and most often on one side of the body. S - Speech. Sudden trouble speaking, slurred speech, or trouble understanding what people say. T - Time. Time to call emergency services. Write down what time symptoms started. You have other signs of a stroke, such as: A sudden, very bad headache with no known cause. Feeling like you may vomit (nausea). Vomiting. A seizure. You have chest pain or trouble breathing. These symptoms may be an emergency. Get help right away. Call your local emergency services (911 in the [place] Do not wait to see if the symptoms will go away. Do not drive yourself to the hospital. Summary Peripheral vascular disease (PVD) is a disease of the blood vessels. PVD affects the legs and feet the most. Symptoms may include leg pain or leg numbness, tingling, and weakness. Treatment may include lifestyle changes, medicines, and procedures. This information is not intended to replace advice given to you by your health  care provider. Make sure you discuss any questions you have with your health care provider. Document Revised: 08/26/2019 Document Reviewed: 08/26/2019 Elsevier Patient Education  2023 Elsevier Inc.  

## 2022-04-12 NOTE — Progress Notes (Signed)
MRN : [id number]  Patricia Decker is a 67 y.o. ([birth year]) female who presents with chief complaint of  Chief Complaint  Patient presents with   Follow-up    Ultrasound follow up  .  History of Present Illness: Patient returns today in follow up of her PAD.  She is doing well without any current lifestyle limiting claudication, ischemic rest pain, or ulceration.  She has previously undergone iliac artery intervention with good results.  ABIs today are 1.16 on the right 1.21 on the left with triphasic waveforms and normal digital pressures consistent with no arterial insufficiency after previous revascularization.  Current Outpatient Medications  Medication Sig Dispense Refill   aspirin EC 81 MG tablet Take 1 tablet (81 mg total) by mouth daily. 150 tablet 2   atorvastatin (LIPITOR) 10 MG tablet TAKE 1 TABLET BY MOUTH ONCE EVERY EVENING 30 tablet 11   clopidogrel (PLAVIX) 75 MG tablet TAKE 1 TABLET BY MOUTH ONCE DAILY 30 tablet 11   HYDROcodone-acetaminophen (NORCO/VICODIN) 5-325 MG tablet Take 1 tablet by mouth every 6 (six) hours as needed for moderate pain.     albuterol (VENTOLIN HFA) 108 (90 Base) MCG/ACT inhaler Inhale 2 puffs into the lungs every 6 (six) hours as needed for wheezing or shortness of breath. (Patient not taking: Reported on 04/12/2022) 8 g 0   cefpodoxime (VANTIN) 200 MG tablet Take 1 tablet (200 mg total) by mouth 2 (two) times daily. (Patient not taking: Reported on 04/12/2022) 20 tablet 0   doxycycline (VIBRA-TABS) 100 MG tablet Take 1 tablet (100 mg total) by mouth 2 (two) times daily. (Patient not taking: Reported on 04/12/2022) 20 tablet 0   fluticasone (FLONASE) 50 MCG/ACT nasal spray Place 2 sprays into both nostrils daily. (Patient not taking: Reported on 04/12/2022) 16 g 6   predniSONE (DELTASONE) 10 MG tablet Day 1 & 2 take 6 tablets Day 3 &4 take 5 tablets Day 5 &6 take 4 tablets Day 7 & 8 take 3 tablets Day 9 & 10 take 2 tablets Day 11 & 12 take 1 tablet Day 13 &  14 take 1/2 tablet (Patient not taking: Reported on 04/12/2022) 44 tablet 0   No current facility-administered medications for this visit.    Past Medical History:  Diagnosis Date   Anemia    Anxiety    Arthritis    Asthma    Cataract    removed   Depression    Wears dentures    Full upper, partial lower    Past Surgical History:  Procedure Laterality Date   ABDOMINAL HYSTERECTOMY     APPENDECTOMY     CATARACT EXTRACTION, BILATERAL     CHOLECYSTECTOMY     COLONOSCOPY WITH PROPOFOL N/A 06/30/2016   Procedure: COLONOSCOPY WITH PROPOFOL;  Surgeon: Jonathon Bellows, MD;  Location: [facility];  Service: Endoscopy;  Laterality: N/A;   EAR CYST EXCISION Left 07/31/2019   Procedure: EXCISION EAR LESION;  Surgeon: Carloyn Manner, MD;  Location: [place];  Service: ENT;  Laterality: Left;  Latex   LOWER EXTREMITY ANGIOGRAPHY Left 04/02/2021   Procedure: LOWER EXTREMITY ANGIOGRAPHY;  Surgeon: Algernon Huxley, MD;  Location: [place] [facility];  Service: Cardiovascular;  Laterality: Left;     Social History   Tobacco Use   Smoking status: Never   Smokeless tobacco: Never  Vaping Use   Vaping Use: Never used  Substance Use Topics   Alcohol use: No   Drug use: No  Family History  Problem Relation Age of Onset   Alzheimer's disease Sister    Breast cancer Sister 70    Allergies  Allergen Reactions   Penicillins Other (See Comments)   Sulfa Antibiotics     Indigestion   Latex Rash    Gloves irritate hands when worn     REVIEW OF SYSTEMS (Negative unless checked)   Constitutional: '[]'$ Weight loss  '[]'$ Fever  '[]'$ Chills Cardiac: '[]'$ Chest pain   '[]'$ Chest pressure   '[]'$ Palpitations   '[]'$ Shortness of breath when laying flat   '[]'$ Shortness of breath at rest   '[]'$ Shortness of breath with exertion. Vascular:  '[x]'$ Pain in legs with walking   '[]'$ Pain in legs at rest   '[]'$ Pain in legs when laying flat   '[x]'$ Claudication   '[]'$ Pain in feet when walking  '[]'$ Pain in feet at rest  '[]'$ Pain  in feet when laying flat   '[]'$ History of DVT   '[]'$ Phlebitis   '[]'$ Swelling in legs   '[]'$ Varicose veins   '[]'$ Non-healing ulcers Pulmonary:   '[]'$ Uses home oxygen   '[]'$ Productive cough   '[]'$ Hemoptysis   '[]'$ Wheeze  '[]'$ COPD   '[x]'$ Asthma Neurologic:  '[]'$ Dizziness  '[]'$ Blackouts   '[]'$ Seizures   '[]'$ History of stroke   '[]'$ History of TIA  '[]'$ Aphasia   '[]'$ Temporary blindness   '[]'$ Dysphagia   '[]'$ Weakness or numbness in arms   '[]'$ Weakness or numbness in legs Musculoskeletal:  '[x]'$ Arthritis   '[]'$ Joint swelling   '[]'$ Joint pain   '[]'$ Low back pain Hematologic:  '[]'$ Easy bruising  '[]'$ Easy bleeding   '[]'$ Hypercoagulable state   '[x]'$ Anemic   Gastrointestinal:  '[]'$ Blood in stool   '[]'$ Vomiting blood  '[]'$ Gastroesophageal reflux/heartburn   '[]'$ Abdominal pain Genitourinary:  '[]'$ Chronic kidney disease   '[]'$ Difficult urination  '[]'$ Frequent urination  '[]'$ Burning with urination   '[]'$ Hematuria Skin:  '[]'$ Rashes   '[]'$ Ulcers   '[]'$ Wounds Psychological:  '[x]'$ History of anxiety   '[x]'$  History of major depression.   Physical Examination  BP 136/79 (BP Location: Left Arm)   Pulse 60   Resp 16   Wt 129 lb 3.2 oz (58.6 kg)   BMI 21.50 kg/m  Gen:  WD/WN, NAD Head: [place]/AT, No temporalis wasting. Ear/Nose/Throat: Hearing grossly intact, nares w/o erythema or drainage Eyes: Conjunctiva clear. Sclera non-icteric Neck: Supple.  Trachea midline Pulmonary:  Good air movement, no use of accessory muscles.  Cardiac: RRR, no JVD Vascular:  Vessel Right Left  Radial Palpable Palpable                          PT Palpable Palpable  DP Palpable Palpable   Gastrointestinal: soft, non-tender/non-distended. No guarding/reflex.  Musculoskeletal: M/S 5/5 throughout.  No deformity or atrophy. No edema. Neurologic: Sensation grossly intact in extremities.  Symmetrical.  Speech is fluent.  Psychiatric: Judgment intact, Mood & affect appropriate for pt's clinical situation. Dermatologic: No rashes or ulcers noted.  No cellulitis or open wounds.     Labs No results found for this  or any previous visit (from the past 2160 hour(s)).  Radiology DG Chest 2 View  Result Date: 03/19/2022 CLINICAL DATA:  Cough, wheezing. EXAM: CHEST - 2 VIEW COMPARISON:  Chest x-ray dated 12/13/2005. FINDINGS: Bilateral perihilar bronchitic changes, with associated interstitial prominence at the bilateral lung bases. No confluent opacity to suggest a consolidating pneumonia. No pleural effusion or pneumothorax is seen. Osseous structures about the chest are unremarkable. IMPRESSION: 1. Acute bronchitis. 2. No evidence of consolidating pneumonia. Electronically Signed   By: Franki Cabot M.D.   On: 03/19/2022  15:44    Assessment/Plan  PAD (peripheral artery disease) (HCC) ABIs today are 1.16 on the right 1.21 on the left with triphasic waveforms and normal digital pressures consistent with no arterial insufficiency after previous revascularization.  Continue aspirin, Plavix, and Lipitor.  We can now check this on an annual basis.    [name], MD  04/12/2022 11:35 AM    This note was created with Dragon medical transcription system.  Any errors from dictation are purely unintentional

## 2022-04-12 NOTE — Assessment & Plan Note (Signed)
ABIs today are 1.16 on the right 1.21 on the left with triphasic waveforms and normal digital pressures consistent with no arterial insufficiency after previous revascularization.  Continue aspirin, Plavix, and Lipitor.  We can now check this on an annual basis.

## 2022-04-15 LAB — VAS US ABI WITH/WO TBI
Left ABI: 1.21
Right ABI: 1.16

## 2022-04-25 ENCOUNTER — Ambulatory Visit
Admission: RE | Admit: 2022-04-25 | Discharge: 2022-04-25 | Disposition: A | Discharge status: Home / Self Care | Payer: Medicare Other | Source: Ambulatory Visit | Attending: Family Medicine | Admitting: Family Medicine

## 2022-04-25 DIAGNOSIS — Z1231 Encounter for screening mammogram for malignant neoplasm of breast: Secondary | ICD-10-CM | POA: Diagnosis not present

## 2022-04-27 ENCOUNTER — Other Ambulatory Visit: Payer: Self-pay | Admitting: Family Medicine

## 2022-04-27 DIAGNOSIS — R928 Other abnormal and inconclusive findings on diagnostic imaging of breast: Secondary | ICD-10-CM

## 2022-04-27 DIAGNOSIS — N6489 Other specified disorders of breast: Secondary | ICD-10-CM

## 2022-04-28 ENCOUNTER — Ambulatory Visit
Admission: RE | Admit: 2022-04-28 | Discharge: 2022-04-28 | Disposition: A | Discharge status: Home / Self Care | Payer: Medicare Other | Source: Ambulatory Visit | Attending: Family Medicine | Admitting: Family Medicine

## 2022-04-28 DIAGNOSIS — N6489 Other specified disorders of breast: Secondary | ICD-10-CM

## 2022-04-28 DIAGNOSIS — R928 Other abnormal and inconclusive findings on diagnostic imaging of breast: Secondary | ICD-10-CM | POA: Diagnosis not present

## 2022-04-28 NOTE — Progress Notes (Signed)
Hi Cylie  Normal mammogram; repeat in 1 year.  Please let us know if you have any questions.  Thank you,  Tally Joe, FNP

## 2022-04-29 ENCOUNTER — Telehealth: Payer: Self-pay

## 2022-04-29 NOTE — Telephone Encounter (Signed)
Pt called for mammogram results. Shared provider's note.  Gwyneth Sprout, FNP 04/28/2022  2:27 PM EST     Hi Kadedra   Normal mammogram; repeat in 1 year.   Please let us know if you have any questions.   Thank you,   Tally Joe, FNP

## 2022-05-02 ENCOUNTER — Telehealth: Payer: Self-pay

## 2022-05-02 NOTE — Telephone Encounter (Signed)
Copied from [place] [id number]. Topic: Appointment Scheduling - Scheduling Inquiry for Clinic >> May 02, 2022 10:54 AM Erskine Squibb wrote: Reason for CRM: The patient called in wanting to schedule her yearly physical but it needs to be set up as AWV Sequential. Please assist patient with scheduling as she is scheduled for the first part by phone on 4/29. The patient states that Monday would be the best if possible

## 2022-05-03 NOTE — Telephone Encounter (Signed)
Appt made for 07/12/22 at 3:00

## 2022-05-04 DIAGNOSIS — H524 Presbyopia: Secondary | ICD-10-CM | POA: Diagnosis not present

## 2022-05-04 DIAGNOSIS — H25013 Cortical age-related cataract, bilateral: Secondary | ICD-10-CM | POA: Diagnosis not present

## 2022-06-08 NOTE — Progress Notes (Unsigned)
Complete physical exam   Patient: Patricia Decker   DOB: 09/01/1955   67 y.o. Female  MRN: [id number] Visit Date: 06/09/2022  Today's healthcare provider: Gwyneth Sprout, FNP   No chief complaint on file.  Subjective    Patricia Decker is a 67 y.o. female who presents today for a complete physical exam.  She reports consuming a {diet types:17450} diet. {Exercise:19826} She generally feels {well/fairly well/poorly:18703}. She reports sleeping {well/fairly well/poorly:18703}. She {does/does not:200015} have additional problems to discuss today.  HPI  -Covid Vaccine: -Pneumonia Vaccine: -Dexa Scan: -AWV : Scheduled 07/04/22  Past Medical History:  Diagnosis Date   Anemia    Anxiety    Arthritis    Asthma    Cataract    removed   Depression    Wears dentures    Full upper, partial lower   Past Surgical History:  Procedure Laterality Date   ABDOMINAL HYSTERECTOMY     APPENDECTOMY     CATARACT EXTRACTION, BILATERAL     CHOLECYSTECTOMY     COLONOSCOPY WITH PROPOFOL N/A 06/30/2016   Procedure: COLONOSCOPY WITH PROPOFOL;  Surgeon: Jonathon Bellows, MD;  Location: [facility];  Service: Endoscopy;  Laterality: N/A;   EAR CYST EXCISION Left 07/31/2019   Procedure: EXCISION EAR LESION;  Surgeon: Carloyn Manner, MD;  Location: [place];  Service: ENT;  Laterality: Left;  Latex   LOWER EXTREMITY ANGIOGRAPHY Left 04/02/2021   Procedure: LOWER EXTREMITY ANGIOGRAPHY;  Surgeon: Algernon Huxley, MD;  Location: [place] [facility];  Service: Cardiovascular;  Laterality: Left;   Social History   Socioeconomic History   Marital status: Widowed    Spouse name: Not on file   Number of children: 1   Years of education: Not on file   Highest education level: Some college, no degree  Occupational History   Occupation: disability  Tobacco Use   Smoking status: Never   Smokeless tobacco: Never  Vaping Use   Vaping Use: Never used  Substance and Sexual Activity   Alcohol  use: No   Drug use: No   Sexual activity: Not on file  Other Topics Concern   Not on file  Social History Narrative   Not on file   Social Determinants of Health   Financial Resource Strain: Low Risk  (06/28/2021)   Overall Financial Resource Strain (CARDIA)    Difficulty of Paying Living Expenses: Not hard at all  Food Insecurity: No Food Insecurity (06/28/2021)   Hunger Vital Sign    Worried About Running Out of Food in the Last Year: Never true    [place] in the Last Year: Never true  Transportation Needs: No Transportation Needs (06/28/2021)   PRAPARE - Hydrologist (Medical): No    Lack of Transportation (Non-Medical): No  Physical Activity: Insufficiently Active (06/28/2021)   Exercise Vital Sign    Days of Exercise per Week: 2 days    Minutes of Exercise per Session: 20 min  Stress: No Stress Concern Present (06/28/2021)   [place]    Feeling of Stress : Not at all  Social Connections: Socially Isolated (06/28/2021)   Social Connection and Isolation Panel [NHANES]    Frequency of Communication with Friends and Family: Once a week    Frequency of Social Gatherings with Friends and Family: Once a week    Attends Religious Services: More than 4 times per year  Active Member of Clubs or Organizations: No    Attends Archivist Meetings: Never    Marital Status: Widowed  Intimate Partner Violence: Not At Risk (06/28/2021)   Humiliation, Afraid, Rape, and Kick questionnaire    Fear of Current or Ex-Partner: No    Emotionally Abused: No    Physically Abused: No    Sexually Abused: No   Family Status  Relation Name Status   Sister  Deceased at age 67's   Mother  Deceased at age 77       bone cancer   Father  Deceased at age 50       kidney failure   Brother  Alive   Son  Deceased at age 48   Sister  Alive   Sister  Alive   Sister  Alive   Sister  [place]   Brother  Alive   Sister  Alive   Family History  Problem Relation Age of Onset   Alzheimer's disease Sister    Breast cancer Sister 59   Allergies  Allergen Reactions   Penicillins Other (See Comments)   Sulfa Antibiotics     Indigestion   Latex Rash    Gloves irritate hands when worn    Patient Care Team: Gwyneth Sprout, FNP as PCP - General (Family Medicine) Earnestine Leys, MD as Consulting Physician (Orthopedic Surgery) Valente David, RN as [facility]   Medications: Outpatient Medications Prior to Visit  Medication Sig   albuterol (VENTOLIN HFA) 108 (90 Base) MCG/ACT inhaler Inhale 2 puffs into the lungs every 6 (six) hours as needed for wheezing or shortness of breath. (Patient not taking: Reported on 04/12/2022)   aspirin EC 81 MG tablet Take 1 tablet (81 mg total) by mouth daily.   atorvastatin (LIPITOR) 10 MG tablet TAKE 1 TABLET BY MOUTH ONCE EVERY EVENING   cefpodoxime (VANTIN) 200 MG tablet Take 1 tablet (200 mg total) by mouth 2 (two) times daily. (Patient not taking: Reported on 04/12/2022)   clopidogrel (PLAVIX) 75 MG tablet TAKE 1 TABLET BY MOUTH ONCE DAILY   doxycycline (VIBRA-TABS) 100 MG tablet Take 1 tablet (100 mg total) by mouth 2 (two) times daily. (Patient not taking: Reported on 04/12/2022)   fluticasone (FLONASE) 50 MCG/ACT nasal spray Place 2 sprays into both nostrils daily. (Patient not taking: Reported on 04/12/2022)   HYDROcodone-acetaminophen (NORCO/VICODIN) 5-325 MG tablet Take 1 tablet by mouth every 6 (six) hours as needed for moderate pain.   predniSONE (DELTASONE) 10 MG tablet Day 1 & 2 take 6 tablets Day 3 &4 take 5 tablets Day 5 &6 take 4 tablets Day 7 & 8 take 3 tablets Day 9 & 10 take 2 tablets Day 11 & 12 take 1 tablet Day 13 & 14 take 1/2 tablet (Patient not taking: Reported on 04/12/2022)   No facility-administered medications prior to visit.    Review of Systems  {Labs  Heme  Chem   Endocrine  Serology  Results Review (optional):23779}  Objective    There were no vitals taken for this visit. {Show previous vital signs (optional):23777}   Physical Exam  ***  Last depression screening scores    03/17/2022    3:08 PM 06/28/2021   10:35 AM 05/06/2020    1:33 PM  PHQ 2/9 Scores  PHQ - 2 Score 0 0 0  PHQ- 9 Score 0  0   Last fall risk screening  03/17/2022    3:08 PM  Fall Risk   Falls in the past year? 0  Number falls in past yr: 0  Injury with Fall? 0  Risk for fall due to : No Fall Risks  Follow up Falls evaluation completed   Last Audit-C alcohol use screening    03/17/2022    3:08 PM  Alcohol Use Disorder Test (AUDIT)  1. How often do you have a drink containing alcohol? 0  3. How often do you have six or more drinks on one occasion? 0   A score of 3 or more in women, and 4 or more in men indicates increased risk for alcohol abuse, EXCEPT if all of the points are from question 1   No results found for any visits on 06/09/22.  Assessment & Plan    Routine Health Maintenance and Physical Exam  Exercise Activities and Dietary recommendations  Goals      DIET - EAT MORE FRUITS AND VEGETABLES     DIET - INCREASE WATER INTAKE     Recommend to drink at least 6-8 8oz glasses of water per day.         Immunization History  Administered Date(s) Administered   MMR 04/25/2000   Moderna Covid-19 Vaccine Bivalent Booster 26yrs & up 01/29/2020   Moderna Sars-Covid-2 Vaccination 05/22/2019, 06/27/2019    Health Maintenance  Topic Date Due   DTaP/Tdap/Td (1 - Tdap) Never done   Zoster Vaccines- Shingrix (1 of 2) Never done   COVID-19 Vaccine (4 - 2023-24 season) 11/05/2021   DEXA SCAN  Never done   Pneumonia Vaccine 55+ Years old (1 of 1 - PCV) 05/11/2022   Medicare Annual Wellness (AWV)  06/29/2022   INFLUENZA VACCINE  10/06/2022   MAMMOGRAM  04/25/2024   COLONOSCOPY (Pts 45-45yrs Insurance coverage will need to be confirmed)  07/01/2026    PAP SMEAR-Modifier  09/25/2026   Hepatitis C Screening  Completed   HIV Screening  Completed   HPV VACCINES  Aged Out    Discussed health benefits of physical activity, and encouraged her to engage in regular exercise appropriate for her age and condition.  ***  No follow-ups on file.     {provider attestation***:1}   Gwyneth Sprout, [place] [phone] (phone) [phone] (fax)  [place]

## 2022-06-09 ENCOUNTER — Encounter: Payer: Self-pay | Admitting: Family Medicine

## 2022-06-09 ENCOUNTER — Ambulatory Visit (INDEPENDENT_AMBULATORY_CARE_PROVIDER_SITE_OTHER): Payer: Medicare Other | Admitting: Family Medicine

## 2022-06-09 VITALS — BP 135/77 | HR 66 | Temp 98.6°F | Resp 16 | Ht 65.0 in | Wt 132.9 lb

## 2022-06-09 DIAGNOSIS — I739 Peripheral vascular disease, unspecified: Secondary | ICD-10-CM | POA: Diagnosis not present

## 2022-06-09 DIAGNOSIS — R002 Palpitations: Secondary | ICD-10-CM

## 2022-06-09 DIAGNOSIS — F410 Panic disorder [episodic paroxysmal anxiety] without agoraphobia: Secondary | ICD-10-CM

## 2022-06-09 DIAGNOSIS — Z0000 Encounter for general adult medical examination without abnormal findings: Secondary | ICD-10-CM | POA: Insufficient documentation

## 2022-06-09 DIAGNOSIS — D508 Other iron deficiency anemias: Secondary | ICD-10-CM | POA: Diagnosis not present

## 2022-06-09 DIAGNOSIS — F4310 Post-traumatic stress disorder, unspecified: Secondary | ICD-10-CM

## 2022-06-09 MED ORDER — ALPRAZOLAM 0.5 MG PO TABS
0.2500 mg | ORAL_TABLET | Freq: Every day | ORAL | 0 refills | Status: DC | PRN
Start: 2022-06-09 — End: 2022-07-19

## 2022-06-09 NOTE — Assessment & Plan Note (Signed)
Acute on chronic, worse in past month as it would have been her son's birthday Previously followed by psych given multiple losses in short period of time Will provide low dose abortive xanax to assist her faith given times of crisis  PDMP reviewed; has not been on medication in ~8 months  Pt is aware of risks of psychoactive medication use to include increased sedation, respiratory suppression, falls, extrapyramidal movements,  dependence and cardiovascular events.  Pt would like to continue treatment as benefit determined to outweigh risk.

## 2022-06-09 NOTE — Assessment & Plan Note (Signed)
Chronic, previously stable with dietary changes Repeat CBC

## 2022-06-09 NOTE — Assessment & Plan Note (Signed)
Acute, unknown Will repeat CMP, CBC and TSH given unknown cause Normal SR today; normal thyroid exam Hx of PTSD with panic attacks

## 2022-06-09 NOTE — Assessment & Plan Note (Deleted)
Acute on chronic, worse in past month as it would have been her son's birthday Previously followed by psych given multiple losses in short period of time Will provide low dose abortive xanax to assist her faith given times of crisis  PDMP reviewed; has not been on medication in ~8 months  Pt is aware of risks of psychoactive medication use to include increased sedation, respiratory suppression, falls, extrapyramidal movements,  dependence and cardiovascular events.  Pt would like to continue treatment as benefit determined to outweigh risk.

## 2022-06-09 NOTE — Assessment & Plan Note (Signed)
Chronic, stable Continue lipitor and plavix as previously recommended by vascular Repeat CMP, LP and A1c

## 2022-06-09 NOTE — Assessment & Plan Note (Signed)
UTD on all preventative care; will place DEXA for next year Things to do to keep yourself healthy  - Exercise at least 30-45 minutes a day, 3-4 days a week.  - Eat a low-fat diet with lots of fruits and vegetables, up to 7-9 servings per day.  - Seatbelts can save your life. Wear them always.  - Smoke detectors on every level of your home, check batteries every year.  - Eye Doctor - have an eye exam every 1-2 years  - Safe sex - if you may be exposed to STDs, use a condom.  - Alcohol -  If you drink, do it moderately, less than 2 drinks per day.  - [place]. Choose someone to speak for you if you are not able.  - Depression is common in our stressful world.If you're feeling down or losing interest in things you normally enjoy, please come in for a visit.  - Violence - If anyone is threatening or hurting you, please call immediately.

## 2022-06-10 LAB — COMPREHENSIVE METABOLIC PANEL
ALT: 19 IU/L (ref 0–32)
AST: 23 IU/L (ref 0–40)
Albumin/Globulin Ratio: 1.7 (ref 1.2–2.2)
Albumin: 4.2 g/dL (ref 3.9–4.9)
Alkaline Phosphatase: 74 IU/L (ref 44–121)
BUN/Creatinine Ratio: 19 (ref 12–28)
BUN: 15 mg/dL (ref 8–27)
Bilirubin Total: <0.2 mg/dL (ref 0.0–1.2)
CO2: 23 mmol/L (ref 20–29)
Calcium: 9.3 mg/dL (ref 8.7–10.3)
Chloride: 105 mmol/L (ref 96–106)
Creatinine, Ser: 0.79 mg/dL (ref 0.57–1.00)
Globulin, Total: 2.5 g/dL (ref 1.5–4.5)
Glucose: 71 mg/dL (ref 70–99)
Potassium: 4.1 mmol/L (ref 3.5–5.2)
Sodium: 142 mmol/L (ref 134–144)
Total Protein: 6.7 g/dL (ref 6.0–8.5)
eGFR: 83 mL/min/{1.73_m2} (ref >59)

## 2022-06-10 LAB — LIPID PANEL
Chol/HDL Ratio: 2.4 ratio (ref 0.0–4.4)
Cholesterol, Total: 153 mg/dL (ref 100–199)
HDL: 63 mg/dL (ref >39)
LDL Chol Calc (NIH): 75 mg/dL (ref 0–99)
Triglycerides: 79 mg/dL (ref 0–149)
VLDL Cholesterol Cal: 15 mg/dL (ref 5–40)

## 2022-06-10 LAB — TSH+FREE T4
Free T4: 1.13 ng/dL (ref 0.82–1.77)
TSH: 1.06 u[IU]/mL (ref 0.450–4.500)

## 2022-06-10 LAB — CBC WITH DIFFERENTIAL/PLATELET
Basophils Absolute: 0.1 10*3/uL (ref 0.0–0.2)
Basos: 1 %
EOS (ABSOLUTE): 0.1 10*3/uL (ref 0.0–0.4)
Eos: 2 %
Hematocrit: 31.5 % — ABNORMAL LOW (ref 34.0–46.6)
Hemoglobin: 10.5 g/dL — ABNORMAL LOW (ref 11.1–15.9)
Immature Grans (Abs): 0 10*3/uL (ref 0.0–0.1)
Immature Granulocytes: 0 %
Lymphocytes Absolute: 3.1 10*3/uL (ref 0.7–3.1)
Lymphs: 49 %
MCH: 28.3 pg (ref 26.6–33.0)
MCHC: 33.3 g/dL (ref 31.5–35.7)
MCV: 85 fL (ref 79–97)
Monocytes Absolute: 0.5 10*3/uL (ref 0.1–0.9)
Monocytes: 7 %
Neutrophils Absolute: 2.6 10*3/uL (ref 1.4–7.0)
Neutrophils: 41 %
Platelets: 301 10*3/uL (ref 150–450)
RBC: 3.71 x10E6/uL — ABNORMAL LOW (ref 3.77–5.28)
RDW: 14.3 % (ref 11.7–15.4)
WBC: 6.3 10*3/uL (ref 3.4–10.8)

## 2022-06-10 NOTE — Progress Notes (Signed)
Slight anemia has returned; if you are feeling fatigued we can pursue further workup with both GI and hematology.  Cholesterol is at goal. I continue to recommend diet low in saturated fat and regular exercise - 30 min at least 5 times per week. Heart attack and stroke risk remains moderate at 7% in 10 years. The 10-year ASCVD risk score (Arnett DK, et al., 2019) is: 6.8%   Values used to calculate the score:     Age: 67 years     Sex: Female     Is Non-Hispanic African American: Yes     Diabetic: No     Tobacco smoker: No     Systolic Blood Pressure: 135 mmHg     Is BP treated: No     HDL Cholesterol: 63 mg/dL     Total Cholesterol: 153 mg/dL Blood chemistry is stable.

## 2022-06-15 ENCOUNTER — Telehealth: Payer: Self-pay

## 2022-06-15 DIAGNOSIS — D508 Other iron deficiency anemias: Secondary | ICD-10-CM

## 2022-06-15 NOTE — Telephone Encounter (Signed)
-----   Message from Jacky Kindle, FNP sent at 06/10/2022  7:59 AM EDT ----- Slight anemia has returned; if you are feeling fatigued we can pursue further workup with both GI and hematology.  Cholesterol is at goal. I continue to recommend diet low in saturated fat and regular exercise - 30 min at least 5 times per week. Heart attack and stroke risk remains moderate at 7% in 10 years. The 10-year ASCVD risk score (Arnett DK, et al., 2019) is: 6.8%   Values used to calculate the score:     Age: 67 years     Sex: Female     Is Non-Hispanic African American: Yes     Diabetic: No     Tobacco smoker: No     Systolic Blood Pressure: 135 mmHg     Is BP treated: No     HDL Cholesterol: 63 mg/dL     Total Cholesterol: 153 mg/dL Blood chemistry is stable.

## 2022-06-20 ENCOUNTER — Telehealth: Payer: Self-pay

## 2022-06-20 NOTE — Telephone Encounter (Signed)
Copied from CRM [id number]. Topic: General - Other >> Jun 20, 2022  3:57 PM Dominique A wrote: Reason for CRM: Pt states that at her last visit her PCP stated that she was anemic and wanted her to see a blood doctor. Per pt they called her from [place] and pt states that she is not driving all the way to [place]. Per pt she has been anemic all her life and her last doctor just called her in something for her iron.  Pt is wanting PCP to call her in something for her iron. Please advise

## 2022-06-22 ENCOUNTER — Encounter: Payer: Self-pay | Admitting: Family Medicine

## 2022-06-27 ENCOUNTER — Telehealth: Payer: Self-pay

## 2022-06-27 NOTE — Telephone Encounter (Signed)
Patient is scheduled with gi in August. Hematology appt was not scheduled cause it was in [place]. She wants to know why she needs these referral. When in the past she was prescribed iron supplement.   Copied from CRM [id number]. Topic: General - Other >> Jun 27, 2022  8:26 AM Patricia Decker wrote: Reason for CRM: Pt is calling to speak with PCP. Pt states that the place she was referred to for her Anemia  will not be able to see her until August. Pt is wanting to know why PCP did not prescribe her something instead of her having to wait 4 months. Please have PCP call pt back.

## 2022-07-12 ENCOUNTER — Encounter: Payer: Medicare Other | Admitting: Family Medicine

## 2022-07-19 ENCOUNTER — Encounter: Payer: Self-pay | Admitting: Family Medicine

## 2022-07-19 ENCOUNTER — Ambulatory Visit (INDEPENDENT_AMBULATORY_CARE_PROVIDER_SITE_OTHER): Payer: Medicare Other | Admitting: Family Medicine

## 2022-07-19 VITALS — BP 125/74 | HR 59 | Temp 97.9°F | Resp 12 | Ht 65.0 in | Wt 134.1 lb

## 2022-07-19 DIAGNOSIS — D649 Anemia, unspecified: Secondary | ICD-10-CM | POA: Diagnosis not present

## 2022-07-19 NOTE — Progress Notes (Signed)
I,Sulibeya S Dimas,acting as a scribe for Shirlee Latch, MD.,have documented all relevant documentation on the behalf of Shirlee Latch, MD,as directed by  Shirlee Latch, MD while in the presence of Shirlee Latch, MD.     Established patient visit   Patient: Patricia Decker   DOB: [birth year]   67 y.o. Female  MRN: [id number] Visit Date: 07/19/2022  Today's healthcare provider: Shirlee Latch, MD   Chief Complaint  Patient presents with   Follow-up   Subjective    HPI  Follow up for anemia  The patient was last seen for this 1 months ago. Changes made at last visit include check lab.  She reports  not taking oral iron supplement for several years. She feels that condition is Unchanged. She reports  -----------------------------------------------------------------------------------------  Patient coming in to discuss labs.   Used to take iron - has not taken for many years Anemia runs in her family - all of her sisters have it - she does not know if anyone has thalassemia or sickle cell trait  S/p hysterectomy UTD on CRC screening  Wants to switch PCP to MD/DO - will set her up for next visit with Dr Payton Mccallum  Medications: Outpatient Medications Prior to Visit  Medication Sig   aspirin EC 81 MG tablet Take 1 tablet (81 mg total) by mouth daily.   atorvastatin (LIPITOR) 10 MG tablet TAKE 1 TABLET BY MOUTH ONCE EVERY EVENING   clopidogrel (PLAVIX) 75 MG tablet TAKE 1 TABLET BY MOUTH ONCE DAILY   ALPRAZolam (XANAX) 0.5 MG tablet Take 0.5 tablets (0.25 mg total) by mouth daily as needed for anxiety (may repeat dose if needed). (Patient not taking: Reported on 07/19/2022)   No facility-administered medications prior to visit.    Review of Systems  Constitutional:  Negative for appetite change, chills, fatigue and fever.  Respiratory:  Negative for chest tightness and shortness of breath.   Gastrointestinal:  Negative for abdominal pain, blood in stool,  constipation, diarrhea, nausea and vomiting.  Genitourinary:  Negative for hematuria.  Skin:  Negative for color change.       Objective    BP 125/74 (BP Location: Left Arm, Patient Position: Sitting, Cuff Size: Normal)   Pulse (!) 59   Temp 97.9 F (36.6 C) (Temporal)   Resp 12   Ht 5\' 5"  (1.651 m)   Wt 134 lb 1.6 oz (60.8 kg)   SpO2 99%   BMI 22.32 kg/m    Physical Exam Vitals reviewed.  Constitutional:      General: She is not in acute distress.    Appearance: Normal appearance. She is well-developed. She is not diaphoretic.  HENT:     Head: Normocephalic and atraumatic.  Eyes:     General: No scleral icterus.    Conjunctiva/sclera: Conjunctivae normal.  Neck:     Thyroid: No thyromegaly.  Cardiovascular:     Rate and Rhythm: Normal rate and regular rhythm.     Pulses: Normal pulses.     Heart sounds: Normal heart sounds. No murmur heard. Pulmonary:     Effort: Pulmonary effort is normal. No respiratory distress.     Breath sounds: Normal breath sounds. No wheezing, rhonchi or rales.  Abdominal:     General: There is no distension.     Palpations: Abdomen is soft.     Tenderness: There is no abdominal tenderness.  Musculoskeletal:     Cervical back: Neck supple.     Right lower leg: No edema.  Left lower leg: No edema.  Lymphadenopathy:     Cervical: No cervical adenopathy.  Skin:    General: Skin is warm and dry.     Findings: No rash.  Neurological:     Mental Status: She is alert and oriented to person, place, and time. Mental status is at baseline.  Psychiatric:        Mood and Affect: Mood normal.        Behavior: Behavior normal.       No results found for any visits on 07/19/22.  Assessment & Plan     Problem List Items Addressed This Visit       Other   Normocytic anemia - Primary    Longstanding issue with baseline Hgb 10.5-11.5 Never had workup for underlying etiology No known source of bleeding - may not need GI eval pending lab  results No kidney or liver dysfunction or cause for anemia of chronic disease Will check for iron and B12 deficiency, but with normocytic anemia, more likely to be sickle cell trait or thalassemia in setting of strong family history of anemia as well Further management pending lab results for underlying etiology      Relevant Orders   CBC w/Diff/Platelet   Hgb Fractionation Cascade   Sickle Cell Scr   Iron, TIBC and Ferritin Panel   B12 and Folate Panel     Return in about 2 months (around 09/18/2022) for chronic disease f/u with Dr Payton Mccallum.      I, Shirlee Latch, MD, have reviewed all documentation for this visit. The documentation on 07/19/22 for the exam, diagnosis, procedures, and orders are all accurate and complete.   Alper Guilmette, Marzella Schlein, MD, MPH [facility]

## 2022-07-19 NOTE — Assessment & Plan Note (Signed)
Longstanding issue with baseline Hgb 10.5-11.5 Never had workup for underlying etiology No known source of bleeding - may not need GI eval pending lab results No kidney or liver dysfunction or cause for anemia of chronic disease Will check for iron and B12 deficiency, but with normocytic anemia, more likely to be sickle cell trait or thalassemia in setting of strong family history of anemia as well Further management pending lab results for underlying etiology

## 2022-07-22 LAB — HGB FRACTIONATION CASCADE
Hgb A2: 2.3 % (ref 1.8–3.2)
Hgb A: 97.7 % (ref 96.4–98.8)
Hgb F: 0 % (ref 0.0–2.0)
Hgb S: 0 %

## 2022-07-22 LAB — CBC WITH DIFFERENTIAL/PLATELET
Basophils Absolute: 0 10*3/uL (ref 0.0–0.2)
Basos: 1 %
EOS (ABSOLUTE): 0.1 10*3/uL (ref 0.0–0.4)
Eos: 2 %
Hematocrit: 32.8 % — ABNORMAL LOW (ref 34.0–46.6)
Hemoglobin: 10.5 g/dL — ABNORMAL LOW (ref 11.1–15.9)
Immature Grans (Abs): 0 10*3/uL (ref 0.0–0.1)
Immature Granulocytes: 0 %
Lymphocytes Absolute: 3 10*3/uL (ref 0.7–3.1)
Lymphs: 52 %
MCH: 27.9 pg (ref 26.6–33.0)
MCHC: 32 g/dL (ref 31.5–35.7)
MCV: 87 fL (ref 79–97)
Monocytes Absolute: 0.4 10*3/uL (ref 0.1–0.9)
Monocytes: 7 %
Neutrophils Absolute: 2.2 10*3/uL (ref 1.4–7.0)
Neutrophils: 38 %
Platelets: 291 10*3/uL (ref 150–450)
RBC: 3.77 x10E6/uL (ref 3.77–5.28)
RDW: 14.1 % (ref 11.7–15.4)
WBC: 5.7 10*3/uL (ref 3.4–10.8)

## 2022-07-22 LAB — IRON,TIBC AND FERRITIN PANEL
Ferritin: 70 ng/mL (ref 15–150)
Iron Saturation: 18 % (ref 15–55)
Iron: 55 ug/dL (ref 27–139)
Total Iron Binding Capacity: 304 ug/dL (ref 250–450)
UIBC: 249 ug/dL (ref 118–369)

## 2022-07-22 LAB — SICKLE CELL SCREEN: Sickle Cell Screen: NEGATIVE

## 2022-07-22 LAB — B12 AND FOLATE PANEL
Folate: 8 ng/mL (ref >3.0)
Vitamin B-12: 455 pg/mL (ref 232–1245)

## 2022-08-17 DIAGNOSIS — M65312 Trigger thumb, left thumb: Secondary | ICD-10-CM | POA: Diagnosis not present

## 2022-08-29 DIAGNOSIS — M65312 Trigger thumb, left thumb: Secondary | ICD-10-CM | POA: Diagnosis not present

## 2022-09-05 ENCOUNTER — Ambulatory Visit (INDEPENDENT_AMBULATORY_CARE_PROVIDER_SITE_OTHER): Payer: Medicare Other

## 2022-09-05 VITALS — Ht 65.0 in | Wt 134.0 lb

## 2022-09-05 DIAGNOSIS — Z0000 Encounter for general adult medical examination without abnormal findings: Secondary | ICD-10-CM | POA: Diagnosis not present

## 2022-09-05 NOTE — Patient Instructions (Signed)
Patricia Decker , Thank you for taking time to come for your Medicare Wellness Visit. I appreciate your ongoing commitment to your health goals. Please review the following plan we discussed and let me know if I can assist you in the future.   These are the goals we discussed:  Goals      DIET - EAT MORE FRUITS AND VEGETABLES     DIET - INCREASE WATER INTAKE     Recommend to drink at least 6-8 8oz glasses of water per day.        This is a list of the screening recommended for you and due dates:  Health Maintenance  Topic Date Due   DTaP/Tdap/Td vaccine (1 - Tdap) Never done   Zoster (Shingles) Vaccine (1 of 2) Never done   COVID-19 Vaccine (4 - 2023-24 season) 11/05/2021   Pneumonia Vaccine (1 of 1 - PCV) Never done   DEXA scan (bone density measurement)  Never done   Flu Shot  10/06/2022   Medicare Annual Wellness Visit  09/05/2023   Mammogram  04/25/2024   Colon Cancer Screening  07/01/2026   Hepatitis C Screening  Completed   HIV Screening  Completed   HPV Vaccine  Aged Out   Pap Smear  Discontinued    Advanced directives: no  Conditions/risks identified: none  Next appointment: Follow up in one year for your annual wellness visit 09/06/2023 @8 :45 am telephone   Preventive Care 65 Years and Older, Female Preventive care refers to lifestyle choices and visits with your health care provider that can promote health and wellness. What does preventive care include? A yearly physical exam. This is also called an annual well check. Dental exams once or twice a year. Routine eye exams. Ask your health care provider how often you should have your eyes checked. Personal lifestyle choices, including: Daily care of your teeth and gums. Regular physical activity. Eating a healthy diet. Avoiding tobacco and drug use. Limiting alcohol use. Practicing safe sex. Taking low-dose aspirin every day. Taking vitamin and mineral supplements as recommended by your health care  provider. What happens during an annual well check? The services and screenings done by your health care provider during your annual well check will depend on your age, overall health, lifestyle risk factors, and family history of disease. Counseling  Your health care provider may ask you questions about your: Alcohol use. Tobacco use. Drug use. Emotional well-being. Home and relationship well-being. Sexual activity. Eating habits. History of falls. Memory and ability to understand (cognition). Work and work Astronomer. Reproductive health. Screening  You may have the following tests or measurements: Height, weight, and BMI. Blood pressure. Lipid and cholesterol levels. These may be checked every 5 years, or more frequently if you are over 69 years old. Skin check. Lung cancer screening. You may have this screening every year starting at age 39 if you have a 30-pack-year history of smoking and currently smoke or have quit within the past 15 years. Fecal occult blood test (FOBT) of the stool. You may have this test every year starting at age 81. Flexible sigmoidoscopy or colonoscopy. You may have a sigmoidoscopy every 5 years or a colonoscopy every 10 years starting at age 41. Hepatitis C blood test. Hepatitis B blood test. Sexually transmitted disease (STD) testing. Diabetes screening. This is done by checking your blood sugar (glucose) after you have not eaten for a while (fasting). You may have this done every 1-3 years. Bone density scan. This is done  to screen for osteoporosis. You may have this done starting at age 42. Mammogram. This may be done every 1-2 years. Talk to your health care provider about how often you should have regular mammograms. Talk with your health care provider about your test results, treatment options, and if necessary, the need for more tests. Vaccines  Your health care provider may recommend certain vaccines, such as: Influenza vaccine. This is  recommended every year. Tetanus, diphtheria, and acellular pertussis (Tdap, Td) vaccine. You may need a Td booster every 10 years. Zoster vaccine. You may need this after age 18. Pneumococcal 13-valent conjugate (PCV13) vaccine. One dose is recommended after age 33. Pneumococcal polysaccharide (PPSV23) vaccine. One dose is recommended after age 24. Talk to your health care provider about which screenings and vaccines you need and how often you need them. This information is not intended to replace advice given to you by your health care provider. Make sure you discuss any questions you have with your health care provider. Document Released: 03/20/2015 Document Revised: 11/11/2015 Document Reviewed: 12/23/2014 Elsevier Interactive Patient Education  2017 [place] Prevention in the Home Falls can cause injuries. They can happen to people of all ages. There are many things you can do to make your home safe and to help prevent falls. What can I do on the outside of my home? Regularly fix the edges of walkways and driveways and fix any cracks. Remove anything that might make you trip as you walk through a door, such as a raised step or threshold. Trim any bushes or trees on the path to your home. Use bright outdoor lighting. Clear any walking paths of anything that might make someone trip, such as rocks or tools. Regularly check to see if handrails are loose or broken. Make sure that both sides of any steps have handrails. Any raised decks and porches should have guardrails on the edges. Have any leaves, snow, or ice cleared regularly. Use sand or salt on walking paths during winter. Clean up any spills in your garage right away. This includes oil or grease spills. What can I do in the bathroom? Use night lights. Install grab bars by the toilet and in the tub and shower. Do not use towel bars as grab bars. Use non-skid mats or decals in the tub or shower. If you need to sit down in  the shower, use a plastic, non-slip stool. Keep the floor dry. Clean up any water that spills on the floor as soon as it happens. Remove soap buildup in the tub or shower regularly. Attach bath mats securely with double-sided non-slip rug tape. Do not have throw rugs and other things on the floor that can make you trip. What can I do in the bedroom? Use night lights. Make sure that you have a light by your bed that is easy to reach. Do not use any sheets or blankets that are too big for your bed. They should not hang down onto the floor. Have a firm chair that has side arms. You can use this for support while you get dressed. Do not have throw rugs and other things on the floor that can make you trip. What can I do in the kitchen? Clean up any spills right away. Avoid walking on wet floors. Keep items that you use a lot in easy-to-reach places. If you need to reach something above you, use a strong step stool that has a grab bar. Keep electrical cords out of the  way. Do not use floor polish or wax that makes floors slippery. If you must use wax, use non-skid floor wax. Do not have throw rugs and other things on the floor that can make you trip. What can I do with my stairs? Do not leave any items on the stairs. Make sure that there are handrails on both sides of the stairs and use them. Fix handrails that are broken or loose. Make sure that handrails are as long as the stairways. Check any carpeting to make sure that it is firmly attached to the stairs. Fix any carpet that is loose or worn. Avoid having throw rugs at the top or bottom of the stairs. If you do have throw rugs, attach them to the floor with carpet tape. Make sure that you have a light switch at the top of the stairs and the bottom of the stairs. If you do not have them, ask someone to add them for you. What else can I do to help prevent falls? Wear shoes that: Do not have high heels. Have rubber bottoms. Are comfortable  and fit you well. Are closed at the toe. Do not wear sandals. If you use a stepladder: Make sure that it is fully opened. Do not climb a closed stepladder. Make sure that both sides of the stepladder are locked into place. Ask someone to hold it for you, if possible. Clearly mark and make sure that you can see: Any grab bars or handrails. First and last steps. Where the edge of each step is. Use tools that help you move around (mobility aids) if they are needed. These include: Canes. Walkers. Scooters. Crutches. Turn on the lights when you go into a dark area. Replace any light bulbs as soon as they burn out. Set up your furniture so you have a clear path. Avoid moving your furniture around. If any of your floors are uneven, fix them. If there are any pets around you, be aware of where they are. Review your medicines with your doctor. Some medicines can make you feel dizzy. This can increase your chance of falling. Ask your doctor what other things that you can do to help prevent falls. This information is not intended to replace advice given to you by your health care provider. Make sure you discuss any questions you have with your health care provider. Document Released: 12/18/2008 Document Revised: 07/30/2015 Document Reviewed: 03/28/2014 Elsevier Interactive Patient Education  2017 [employer].

## 2022-09-05 NOTE — Progress Notes (Addendum)
Subjective:   Patricia Decker is a 67 y.o. female who presents for Medicare Annual (Subsequent) preventive examination.  Visit Complete: Virtual  I connected with  [name] on 09/05/22 by a audio enabled telemedicine application and verified that I am speaking with the correct person using two identifiers.  Patient Location: Home  Provider Location: Office/Clinic  I discussed the limitations of evaluation and management by telemedicine. The patient expressed understanding and agreed to proceed.  Patient Medicare AWV questionnaire was completed by the patient on (not done); I have confirmed that all information answered by patient is correct and no changes since this date.  Review of Systems    Cardiac Risk Factors include: advanced age (>6men, >41 women)    Objective:    Today's Vitals   09/05/22 0843  Weight: 134 lb (60.8 kg)  Height: 5\' 5"  (1.651 m)   Body mass index is 22.3 kg/m.     09/05/2022    8:59 AM 08/21/2021    7:15 AM 06/28/2021   10:38 AM 04/02/2021   12:07 PM 07/31/2019    6:53 AM 07/10/2019    9:36 AM 07/04/2018   10:09 AM  Advanced Directives  Does Patient Have a Medical Advance Directive? No No No No No No No  Does patient want to make changes to medical advance directive?    No - Patient declined     Would patient like information on creating a medical advance directive?   No - Patient declined No - Patient declined No - Patient declined No - Patient declined No - Patient declined    Current Medications (verified) Outpatient Encounter Medications as of 09/05/2022  Medication Sig   aspirin EC 81 MG tablet Take 1 tablet (81 mg total) by mouth daily.   atorvastatin (LIPITOR) 10 MG tablet TAKE 1 TABLET BY MOUTH ONCE EVERY EVENING   clopidogrel (PLAVIX) 75 MG tablet TAKE 1 TABLET BY MOUTH ONCE DAILY   No facility-administered encounter medications on file as of 09/05/2022.    Allergies (verified) Penicillins, Sulfa antibiotics, and Latex    History: Past Medical History:  Diagnosis Date   Anemia    Anxiety    Arthritis    Asthma    Cataract    removed   Depression    Wears dentures    Full upper, partial lower   Past Surgical History:  Procedure Laterality Date   ABDOMINAL HYSTERECTOMY     APPENDECTOMY     CATARACT EXTRACTION, BILATERAL     CHOLECYSTECTOMY     COLONOSCOPY WITH PROPOFOL N/A 06/30/2016   Procedure: COLONOSCOPY WITH PROPOFOL;  Surgeon: Wyline Mood, MD;  Location: [facility] ENDOSCOPY;  Service: Endoscopy;  Laterality: N/A;   EAR CYST EXCISION Left 07/31/2019   Procedure: EXCISION EAR LESION;  Surgeon: Bud Face, MD;  Location: [facility];  Service: ENT;  Laterality: Left;  Latex   LOWER EXTREMITY ANGIOGRAPHY Left 04/02/2021   Procedure: LOWER EXTREMITY ANGIOGRAPHY;  Surgeon: Annice Needy, MD;  Location: [facility];  Service: Cardiovascular;  Laterality: Left;   Family History  Problem Relation Age of Onset   Alzheimer's disease Sister    Breast cancer Sister 81   Social History   Socioeconomic History   Marital status: Widowed    Spouse name: Not on file   Number of children: 1   Years of education: Not on file   Highest education level: Some college, no degree  Occupational History   Occupation: disability  Tobacco  Use   Smoking status: Never   Smokeless tobacco: Never  Vaping Use   Vaping Use: Never used  Substance and Sexual Activity   Alcohol use: No   Drug use: No   Sexual activity: Not on file  Other Topics Concern   Not on file  Social History Narrative   Not on file   Social Determinants of Health   Financial Resource Strain: Low Risk  (09/05/2022)   Overall Financial Resource Strain (CARDIA)    Difficulty of Paying Living Expenses: Not hard at all  Food Insecurity: No Food Insecurity (09/05/2022)   Hunger Vital Sign    Worried About Running Out of Food in the Last Year: Never true    Ran Out of Food in the Last Year: Never true  Transportation Needs:  No Transportation Needs (09/05/2022)   PRAPARE - Administrator, Civil Service (Medical): No    Lack of Transportation (Non-Medical): No  Physical Activity: Inactive (09/05/2022)   Exercise Vital Sign    Days of Exercise per Week: 0 days    Minutes of Exercise per Session: 0 min  Stress: No Stress Concern Present (09/05/2022)   [employer] of Occupational Health - Occupational Stress Questionnaire    Feeling of Stress : Not at all  Social Connections: Moderately Isolated (09/05/2022)   Social Connection and Isolation Panel [NHANES]    Frequency of Communication with Friends and Family: More than three times a week    Frequency of Social Gatherings with Friends and Family: Once a week    Attends Religious Services: More than 4 times per year    Active Member of [employer] or Organizations: No    Attends Banker Meetings: Never    Marital Status: Widowed    Tobacco Counseling Counseling given: Not Answered   Clinical Intake:  Pre-visit preparation completed: Yes  Pain : No/denies pain     BMI - recorded: 22.3 Nutritional Status: BMI of 19-24  Normal Nutritional Risks: None Diabetes: No  How often do you need to have someone help you when you read instructions, pamphlets, or other written materials from your doctor or pharmacy?: 1 - Never  Interpreter Needed?: No  Comments: lives alone Information entered by :: B.Nakeysha Pasqual,LPN   Activities of Daily Living    09/05/2022    9:04 AM 06/09/2022   10:26 AM  In your present state of health, do you have any difficulty performing the following activities:  Hearing? 0 0  Vision? 0 0  Difficulty concentrating or making decisions? 0 0  Walking or climbing stairs? 0 0  Dressing or bathing? 0 0  Doing errands, shopping? 0 0  Preparing Food and eating ? N   Using the Toilet? N   In the past six months, have you accidently leaked urine? N   Do you have problems with loss of bowel control? N   Managing your  Medications? N   Managing your Finances? N   Housekeeping or managing your Housekeeping? N     Patient Care Team: Sherlyn Hay, DO as PCP - General (Family Medicine) Deeann Saint, MD as Consulting Physician (Orthopedic Surgery) Kemper Durie, RN as [facility] Care Management  Indicate any recent Medical Services you may have received from other than [facility] providers in the past year (date may be approximate).     Assessment:   This is a routine wellness examination for Ernestyne.  Hearing/Vision screen Hearing Screening - Comments:: Adequate hearing Vision Screening -  Comments:: Adequate vision after cataract;readers only Dr Marti Sleigh [facility]   Dietary issues and exercise activities discussed:     Goals Addressed             This Visit's Progress    DIET - EAT MORE FRUITS AND VEGETABLES   On track    DIET - INCREASE WATER INTAKE   On track    Recommend to drink at least 6-8 8oz glasses of water per day.       Depression Screen    09/05/2022    8:50 AM 06/09/2022   10:26 AM 03/17/2022    3:08 PM 06/28/2021   10:35 AM 05/06/2020    1:33 PM 11/20/2019    9:29 AM 07/10/2019    9:32 AM  PHQ 2/9 Scores  PHQ - 2 Score 0 0 0 0 0 1 1  PHQ- 9 Score   0  0 2     Fall Risk    09/05/2022    8:48 AM 06/09/2022   10:26 AM 03/17/2022    3:08 PM 06/28/2021   10:38 AM 05/06/2020    1:32 PM  Fall Risk   Falls in the past year? 0 0 0 0 0  Number falls in past yr: 0 0 0 0 0  Injury with Fall? 0 0 0 0 0  Risk for fall due to : No Fall Risks No Fall Risks No Fall Risks No Fall Risks No Fall Risks  Follow up Education provided;Falls prevention discussed  Falls evaluation completed Falls evaluation completed Falls evaluation completed    MEDICARE RISK AT HOME:  Medicare Risk at Home - 09/05/22 0848     Any stairs in or around the home? No    If so, are there any without handrails? No    Home free of loose throw rugs in walkways, pet beds, electrical cords, etc? Yes     Adequate lighting in your home to reduce risk of falls? Yes    Life alert? No    Use of a cane, walker or w/c? No    Grab bars in the bathroom? No    Shower chair or bench in shower? No    Elevated toilet seat or a handicapped toilet? No             TIMED UP AND GO:  Was the test performed?  No    Cognitive Function:        09/05/2022    9:12 AM 07/10/2019    9:41 AM 06/22/2017    8:51 AM  6CIT Screen  What Year? 0 points 0 points 0 points  What month? 0 points 0 points 0 points  What time? 0 points 0 points 0 points  Count back from 20 0 points 0 points 0 points  Months in reverse 0 points 0 points 0 points  Repeat phrase 0 points 0 points 0 points  Total Score 0 points 0 points 0 points    Immunizations Immunization History  Administered Date(s) Administered   MMR 04/25/2000   Moderna Covid-19 Vaccine Bivalent Booster 48yrs & up 01/29/2020   Moderna Sars-Covid-2 Vaccination 05/22/2019, 06/27/2019    TDAP status: Due, Education has been provided regarding the importance of this vaccine. Advised may receive this vaccine at local pharmacy or Health Dept. Aware to provide a copy of the vaccination record if obtained from local pharmacy or Health Dept. Verbalized acceptance and understanding.  Flu Vaccine status: Declined, Education has been provided regarding the importance of  this vaccine but patient still declined. Advised may receive this vaccine at local pharmacy or Health Dept. Aware to provide a copy of the vaccination record if obtained from local pharmacy or Health Dept. Verbalized acceptance and understanding.  Pneumococcal vaccine status: Declined,  Education has been provided regarding the importance of this vaccine but patient still declined. Advised may receive this vaccine at local pharmacy or Health Dept. Aware to provide a copy of the vaccination record if obtained from local pharmacy or Health Dept. Verbalized acceptance and understanding.   Covid-19 vaccine  status: Completed vaccines  Qualifies for Shingles Vaccine? Yes   Zostavax completed No   Shingrix Completed?: No.    Education has been provided regarding the importance of this vaccine. Patient has been advised to call insurance company to determine out of pocket expense if they have not yet received this vaccine. Advised may also receive vaccine at local pharmacy or Health Dept. Verbalized acceptance and understanding.  Screening Tests Health Maintenance  Topic Date Due   DTaP/Tdap/Td (1 - Tdap) Never done   Zoster Vaccines- Shingrix (1 of 2) Never done   COVID-19 Vaccine (4 - 2023-24 season) 11/05/2021   Pneumonia Vaccine 59+ Years old (1 of 1 - PCV) Never done   DEXA SCAN  Never done   INFLUENZA VACCINE  10/06/2022   Medicare Annual Wellness (AWV)  09/05/2023   MAMMOGRAM  04/25/2024   Colonoscopy  07/01/2026   Hepatitis C Screening  Completed   HIV Screening  Completed   HPV VACCINES  Aged Out   PAP SMEAR-Modifier  Discontinued    Health Maintenance  Health Maintenance Due  Topic Date Due   DTaP/Tdap/Td (1 - Tdap) Never done   Zoster Vaccines- Shingrix (1 of 2) Never done   COVID-19 Vaccine (4 - 2023-24 season) 11/05/2021   Pneumonia Vaccine 39+ Years old (1 of 1 - PCV) Never done   DEXA SCAN  Never done    Colorectal cancer screening: Type of screening: Colonoscopy. Completed yes. Repeat every 10 years  Mammogram status: Completed yes. Repeat every year   Lung Cancer Screening: (Low Dose CT Chest recommended if Age 33-80 years, 20 pack-year currently smoking OR have quit w/in 15years.) does not qualify.   Lung Cancer Screening Referral: no  Additional Screening:  Hepatitis C Screening: does not qualify; Completed yes  Vision Screening: Recommended annual ophthalmology exams for early detection of glaucoma and other disorders of the eye. Is the patient up to date with their annual eye exam?  Yes  Who is the provider or what is the name of the office in which the  patient attends annual eye exams? Dr Marti Sleigh If pt is not established with a provider, would they like to be referred to a provider to establish care? No .   Dental Screening: Recommended annual dental exams for proper oral hygiene  Diabetic Foot Exam: n/a  Community Resource Referral / Chronic Care Management: CRR required this visit?  No   CCM required this visit?  Appt scheduled with PCP for CPE per pt request     Plan:     I have personally reviewed and noted the following in the patient's chart:   Medical and social history Use of alcohol, tobacco or illicit drugs  Current medications and supplements including opioid prescriptions. Patient is not currently taking opioid prescriptions. Functional ability and status Nutritional status Physical activity Advanced directives List of other physicians Hospitalizations, surgeries, and ER visits in previous 12 months Vitals Screenings to  include cognitive, depression, and falls Referrals and appointments  In addition, I have reviewed and discussed with patient certain preventive protocols, quality metrics, and best practice recommendations. A written personalized care plan for preventive services as well as general preventive health recommendations were provided to patient.     Sue Lush, LPN   03/12/1094   After Visit Summary: (MyChart) Due to this being a telephonic visit, the after visit summary with patients personalized plan was offered to patient via MyChart   Nurse Notes: The patient states she is doing well and has no concerns or questions at this time.

## 2022-09-15 ENCOUNTER — Ambulatory Visit: Payer: Medicare Other | Admitting: Internal Medicine

## 2022-10-11 ENCOUNTER — Ambulatory Visit: Payer: Medicare Other | Admitting: Gastroenterology

## 2022-11-17 DIAGNOSIS — L728 Other follicular cysts of the skin and subcutaneous tissue: Secondary | ICD-10-CM | POA: Diagnosis not present

## 2022-11-17 DIAGNOSIS — L308 Other specified dermatitis: Secondary | ICD-10-CM | POA: Diagnosis not present

## 2023-01-03 ENCOUNTER — Ambulatory Visit: Payer: Medicare Other | Admitting: Family Medicine

## 2023-01-03 NOTE — Progress Notes (Deleted)
      Established patient visit   Patient: Patricia Decker   DOB: 30-Dec-1955   67 y.o. Female  MRN: [id number] Visit Date: 01/03/2023  Today's healthcare provider: Sherlyn Hay, DO   No chief complaint on file.  Subjective    Last annual exam: 06/09/2022 Last mAWV: 09/05/2022  HPI Back pain and black mold in home   Flu vaccine?   Tdap vaccine? Shingles vaccine? Prevnar 20? COVID booster?  ***  {History (Optional):23778}  Medications: Outpatient Medications Prior to Visit  Medication Sig   aspirin EC 81 MG tablet Take 1 tablet (81 mg total) by mouth daily.   atorvastatin (LIPITOR) 10 MG tablet TAKE 1 TABLET BY MOUTH ONCE EVERY EVENING   clopidogrel (PLAVIX) 75 MG tablet TAKE 1 TABLET BY MOUTH ONCE DAILY   No facility-administered medications prior to visit.    Review of Systems  ***  {Insert previous labs (optional):23779} {See past labs  Heme  Chem  Endocrine  Serology  Results Review (optional):1}   Objective    There were no vitals taken for this visit. {Insert last BP/Wt (optional):23777}{See vitals history (optional):1}   Physical Exam  ***  No results found for any visits on 01/03/23.  Assessment & Plan    There are no diagnoses linked to this encounter.   ***  No follow-ups on file.      I discussed the assessment and treatment plan with the patient  The patient was provided an opportunity to ask questions and all were answered. The patient agreed with the plan and demonstrated an understanding of the instructions.   The patient was advised to call back or seek an in-person evaluation if the symptoms worsen or if the condition fails to improve as anticipated.    Sherlyn Hay, DO  [facility] [phone] (phone) [phone] (fax)  [facility]

## 2023-01-03 NOTE — Assessment & Plan Note (Deleted)
Noted.  No acute concerns.     ABIs 04/08/2022 were 1.16 (on right) and 1.21 (on left) with triphasic waveforms and normal digital pressures consistent with no arterial insufficiency after previous revascularization.  - Continue Plavix 75 mg daily  - Continue atorvastatin 10 mg daily - Continue aspirin 81 mg daily Follows with vascular surgery; last visit 04/08/2022  - due for annual follow-up in February 2025

## 2023-01-27 ENCOUNTER — Ambulatory Visit (INDEPENDENT_AMBULATORY_CARE_PROVIDER_SITE_OTHER): Payer: Medicare Other | Admitting: Vascular Surgery

## 2023-01-31 ENCOUNTER — Encounter (INDEPENDENT_AMBULATORY_CARE_PROVIDER_SITE_OTHER): Payer: Self-pay | Admitting: Vascular Surgery

## 2023-01-31 ENCOUNTER — Ambulatory Visit (INDEPENDENT_AMBULATORY_CARE_PROVIDER_SITE_OTHER): Payer: Medicare Other

## 2023-01-31 ENCOUNTER — Ambulatory Visit (INDEPENDENT_AMBULATORY_CARE_PROVIDER_SITE_OTHER): Payer: Medicare Other | Admitting: Vascular Surgery

## 2023-01-31 VITALS — BP 137/80 | HR 60 | Resp 16 | Wt 134.8 lb

## 2023-01-31 DIAGNOSIS — I739 Peripheral vascular disease, unspecified: Secondary | ICD-10-CM | POA: Diagnosis not present

## 2023-01-31 DIAGNOSIS — E7800 Pure hypercholesterolemia, unspecified: Secondary | ICD-10-CM

## 2023-01-31 LAB — VAS US ABI WITH/WO TBI
Left ABI: 1.09
Right ABI: 1.17

## 2023-01-31 NOTE — Assessment & Plan Note (Signed)
lipid control important in reducing the progression of atherosclerotic disease. Continue statin therapy  

## 2023-01-31 NOTE — Assessment & Plan Note (Addendum)
ABIs today are stable at 1.17 on the right and 1.09 on the left with multiphasic waveforms and good digital pressures.  Continue on antiplatelet and statin agents.  Can follow-up annually with noninvasive studies going forward.

## 2023-01-31 NOTE — Progress Notes (Signed)
MRN : [id number]  Patricia Decker is a 67 y.o. (17-Feb-1956) female who presents with chief complaint of  Chief Complaint  Patient presents with   Follow-up    Ultrasound follow up  .  History of Present Illness: Patient returns today in follow up of her PAD.  She is almost 2 years status post aortoiliac intervention.  She has done very well since then.  She is still on antiplatelet therapy and a statin agent.  No current lifestyle limiting claudication, ischemic rest pain, or ulceration. ABIs today are stable at 1.17 on the right and 1.09 on the left with multiphasic waveforms and good digital pressures.  Current Outpatient Medications  Medication Sig Dispense Refill   aspirin EC 81 MG tablet Take 1 tablet (81 mg total) by mouth daily. 150 tablet 2   atorvastatin (LIPITOR) 10 MG tablet TAKE 1 TABLET BY MOUTH ONCE EVERY EVENING 30 tablet 11   clopidogrel (PLAVIX) 75 MG tablet TAKE 1 TABLET BY MOUTH ONCE DAILY 30 tablet 11   No current facility-administered medications for this visit.    Past Medical History:  Diagnosis Date   Anemia    Anxiety    Arthritis    Asthma    Cataract    removed   Depression    Wears dentures    Full upper, partial lower    Past Surgical History:  Procedure Laterality Date   ABDOMINAL HYSTERECTOMY     APPENDECTOMY     CATARACT EXTRACTION, BILATERAL     CHOLECYSTECTOMY     COLONOSCOPY WITH PROPOFOL N/A 06/30/2016   Procedure: COLONOSCOPY WITH PROPOFOL;  Surgeon: Wyline Mood, MD;  Location: [facility];  Service: Endoscopy;  Laterality: N/A;   EAR CYST EXCISION Left 07/31/2019   Procedure: EXCISION EAR LESION;  Surgeon: Bud Face, MD;  Location: [facility];  Service: ENT;  Laterality: Left;  Latex   LOWER EXTREMITY ANGIOGRAPHY Left 04/02/2021   Procedure: LOWER EXTREMITY ANGIOGRAPHY;  Surgeon: Annice Needy, MD;  Location: [facility];  Service: Cardiovascular;  Laterality: Left;     Social History   Tobacco Use    Smoking status: Never   Smokeless tobacco: Never  Vaping Use   Vaping status: Never Used  Substance Use Topics   Alcohol use: No   Drug use: No      Family History  Problem Relation Age of Onset   Alzheimer's disease Sister    Breast cancer Sister 66     Allergies  Allergen Reactions   Penicillins Other (See Comments)   Sulfa Antibiotics     Indigestion   Latex Rash    Gloves irritate hands when worn       REVIEW OF SYSTEMS (Negative unless checked)   Constitutional: [] Weight loss  [] Fever  [] Chills Cardiac: [] Chest pain   [] Chest pressure   [] Palpitations   [] Shortness of breath when laying flat   [] Shortness of breath at rest   [] Shortness of breath with exertion. Vascular:  [x] Pain in legs with walking   [] Pain in legs at rest   [] Pain in legs when laying flat   [x] Claudication   [] Pain in feet when walking  [] Pain in feet at rest  [] Pain in feet when laying flat   [] History of DVT   [] Phlebitis   [] Swelling in legs   [] Varicose veins   [] Non-healing ulcers Pulmonary:   [] Uses home oxygen   [] Productive cough   [] Hemoptysis   [] Wheeze  [] COPD   [x] Asthma  Neurologic:  [] Dizziness  [] Blackouts   [] Seizures   [] History of stroke   [] History of TIA  [] Aphasia   [] Temporary blindness   [] Dysphagia   [] Weakness or numbness in arms   [] Weakness or numbness in legs Musculoskeletal:  [x] Arthritis   [] Joint swelling   [] Joint pain   [] Low back pain Hematologic:  [] Easy bruising  [] Easy bleeding   [] Hypercoagulable state   [x] Anemic   Gastrointestinal:  [] Blood in stool   [] Vomiting blood  [] Gastroesophageal reflux/heartburn   [] Abdominal pain Genitourinary:  [] Chronic kidney disease   [] Difficult urination  [] Frequent urination  [] Burning with urination   [] Hematuria Skin:  [] Rashes   [] Ulcers   [] Wounds Psychological:  [x] History of anxiety   [x]  History of major depression.  Physical Examination  BP 137/80 (BP Location: Left Arm)   Pulse 60   Resp 16   Wt 134 lb 12.8 oz  (61.1 kg)   BMI 22.43 kg/m  Gen:  WD/WN, NAD Head: [place]/AT, No temporalis wasting. Ear/Nose/Throat: Hearing grossly intact, nares w/o erythema or drainage Eyes: Conjunctiva clear. Sclera non-icteric Neck: Supple.  Trachea midline Pulmonary:  Good air movement, no use of accessory muscles.  Cardiac: RRR, no JVD Vascular:  Vessel Right Left  Radial Palpable Palpable                          PT Palpable Palpable  DP Palpable Palpable   Gastrointestinal: soft, non-tender/non-distended. No guarding/reflex.  Musculoskeletal: M/S 5/5 throughout.  No deformity or atrophy. No edema. Neurologic: Sensation grossly intact in extremities.  Symmetrical.  Speech is fluent.  Psychiatric: Judgment intact, Mood & affect appropriate for pt's clinical situation. Dermatologic: No rashes or ulcers noted.  No cellulitis or open wounds.      Labs Recent Results (from the past 2160 hour(s))  VAS [place] ABI WITH/WO TBI     Status: None   Collection Time: 01/31/23  3:10 PM  Result Value Ref Range   Right ABI 1.17    Left ABI 1.09     Radiology VAS [place] ABI WITH/WO TBI  Result Date: 01/31/2023  LOWER EXTREMITY DOPPLER STUDY Patient Name:  Patricia Decker  Date of Exam:   01/31/2023 Medical Rec #: [id number]         Accession #:    [id number] Date of Birth: Apr 07, 1955          Patient Gender: F Patient Age:   80 years Exam Location:  [facility] Procedure:      VAS [place] ABI WITH/WO TBI Referring Phys: Festus Barren --------------------------------------------------------------------------------  Indications: Claudication, and peripheral artery disease. High Risk Factors: Hypertension, no history of smoking.  Vascular Interventions: 04/02/2021 left pta and cia stent. Performing Technologist: Hardie Lora RVT  Examination Guidelines: A complete evaluation includes at minimum, Doppler waveform signals and systolic blood pressure reading at the level of bilateral brachial, anterior tibial, and posterior  tibial arteries, when vessel segments are accessible. Bilateral testing is considered an integral part of a complete examination. Photoelectric Plethysmograph (PPG) waveforms and toe systolic pressure readings are included as required and additional duplex testing as needed. Limited examinations for reoccurring indications may be performed as noted.  ABI Findings: +---------+------------------+-----+---------+--------+ Right    Rt Pressure (mmHg)IndexWaveform Comment  +---------+------------------+-----+---------+--------+ Brachial 141                                      +---------+------------------+-----+---------+--------+  PTA      160               1.13 triphasic         +---------+------------------+-----+---------+--------+ DP       165               1.17 triphasic         +---------+------------------+-----+---------+--------+ Great Toe81                0.57                   +---------+------------------+-----+---------+--------+ +---------+------------------+-----+---------+-------+ Left     Lt Pressure (mmHg)IndexWaveform Comment +---------+------------------+-----+---------+-------+ Brachial 141                                     +---------+------------------+-----+---------+-------+ PTA      153               1.09 triphasic        +---------+------------------+-----+---------+-------+ DP       153               1.09 triphasic        +---------+------------------+-----+---------+-------+ Great Toe110               0.78                  +---------+------------------+-----+---------+-------+ +-------+-----------+-----------+------------+------------+ ABI/TBIToday's ABIToday's TBIPrevious ABIPrevious TBI +-------+-----------+-----------+------------+------------+ Right  1.17       0.57       1.08        1.03         +-------+-----------+-----------+------------+------------+ Left   1.09       0.78       1.21        0.99          +-------+-----------+-----------+------------+------------+  Bilateral ABIs appear essentially unchanged compared to prior study on 04/12/2022.  Summary: Right: Resting right ankle-brachial index is within normal range. The right toe-brachial index is abnormal. Left: Resting left ankle-brachial index is within normal range. The left toe-brachial index is normal. *See table(s) above for measurements and observations.  Electronically signed by Festus Barren MD on 01/31/2023 at 3:29:28 PM.    Final     Assessment/Plan  PAD (peripheral artery disease) (HCC) ABIs today are stable at 1.17 on the right and 1.21 on the left with multiphasic waveforms and good digital pressures.  Continue on antiplatelet and statin agents.  Can follow-up annually with noninvasive studies going forward.  Hypercholesterolemia lipid control important in reducing the progression of atherosclerotic disease. Continue statin therapy    Festus Barren, MD  01/31/2023 3:37 PM    This note was created with Dragon medical transcription system.  Any errors from dictation are purely unintentional

## 2023-03-20 ENCOUNTER — Other Ambulatory Visit: Payer: Self-pay | Admitting: Family Medicine

## 2023-03-20 ENCOUNTER — Other Ambulatory Visit (INDEPENDENT_AMBULATORY_CARE_PROVIDER_SITE_OTHER): Payer: Self-pay | Admitting: Nurse Practitioner

## 2023-03-20 DIAGNOSIS — Z1231 Encounter for screening mammogram for malignant neoplasm of breast: Secondary | ICD-10-CM

## 2023-03-23 DIAGNOSIS — M65312 Trigger thumb, left thumb: Secondary | ICD-10-CM | POA: Diagnosis not present

## 2023-04-10 ENCOUNTER — Encounter (INDEPENDENT_AMBULATORY_CARE_PROVIDER_SITE_OTHER): Payer: Medicare Other

## 2023-04-10 ENCOUNTER — Ambulatory Visit (INDEPENDENT_AMBULATORY_CARE_PROVIDER_SITE_OTHER): Payer: Medicare Other | Admitting: Nurse Practitioner

## 2023-05-01 ENCOUNTER — Ambulatory Visit
Admission: RE | Admit: 2023-05-01 | Discharge: 2023-05-01 | Disposition: A | Discharge status: Home / Self Care | Payer: Medicare Other | Source: Ambulatory Visit | Attending: Family Medicine | Admitting: Family Medicine

## 2023-05-01 DIAGNOSIS — Z1231 Encounter for screening mammogram for malignant neoplasm of breast: Secondary | ICD-10-CM | POA: Diagnosis not present

## 2023-05-25 ENCOUNTER — Ambulatory Visit: Payer: Self-pay

## 2023-05-25 NOTE — Telephone Encounter (Signed)
 Patient called, recording call could not be completed as dialed. Patient was already called in a separate triage encounter x 3 and unable to reach patient to triage, same messages. Will route to the office for the provider to review.  Copied from CRM [id number]. Topic: Clinical - Pink Word Triage >> May 25, 2023 10:25 AM Higinio Roger wrote: Reason for Triage: Patient would like something called in for sinus issues. Patient has pressure in head when she bends over, headaches, runny nose, watery eyes, sneezing. Cough.  Callback #: [phone]  Preferred Pharmacy: [facility] - [place], [place] - [address] [place] [address]XBMWU: [phone] Fax: [phone]: Not open 24 hours

## 2023-05-25 NOTE — Telephone Encounter (Signed)
 3 attempts were made to contact pt, no contact made, invalid number.

## 2023-05-26 ENCOUNTER — Telehealth: Payer: Self-pay

## 2023-05-26 ENCOUNTER — Ambulatory Visit: Payer: Self-pay

## 2023-05-26 NOTE — Telephone Encounter (Signed)
 This triage RN Attempted to call the patient at this time. "Call could not be completed as dialed." Unable to leave voicemail.

## 2023-05-26 NOTE — Telephone Encounter (Signed)
 3rd attempt to contact patient regarding sinus issues. Unable to contact patient or leave message to call back.  Chief Complaint: sinus issues Symptoms: see above Frequency: na Pertinent Negatives: Patient denies na Disposition: [] ED /[] Urgent Care (no appt availability in office) / [] Appointment(In office/virtual)/ []  [facility]/ [] Home Care/ [] Refused Recommended Disposition /[] [facility]/ [x]  Follow-up with PCP Additional Notes:   Please advise.     Reason for Disposition  Third attempt to contact caller AND no contact made. Phone number verified.  Answer Assessment - Initial Assessment Questions N/A No contact with patient regarding sinus issues. Phone # [phone] NT get recording call can not be completed as dialed try again later.  Protocols used: No Contact or Duplicate Contact Call-A-AH

## 2023-05-26 NOTE — Telephone Encounter (Signed)
 Attempted to call patient back regarding sinus issues, call could not be completed as dialed.

## 2023-05-26 NOTE — Telephone Encounter (Signed)
 Copied from CRM [id number]. Topic: Clinical - Medication Question >> May 25, 2023  4:27 PM Nyra Capes wrote: Reason for CRM: patient called in,asking if sinus medication has been sent to pharmacy, patient called the pharmacy 3 hours ago, prescription was not sent.  Patient phone # [phone] >> May 25, 2023  4:58 PM Nurse Sheryle Spray wrote: Attempted to reach patient by E2C2 NT and unable to complete the call, see nurse triage encounters dated 05/25/23.

## 2023-05-26 NOTE — Telephone Encounter (Signed)
 See NT encounter today and yesterday. Patient unable to be reached by phone, recordings call cannot be completed as dialed. We will attempt to reach her and document in the NT encounter today. I will route this to the office to attempt to reach out to the patient to discuss this issue.   Copied from CRM [id number]. Topic: General - Other >> May 26, 2023 10:06 AM Antwanette L wrote: Reason for CRM: The call dropped while speak with e2c2 nt. The patient is calling in about a prescription status for her sinuses. I called the patient back but she didn't answer. Please contact the patient back.

## 2023-05-28 NOTE — Telephone Encounter (Signed)
 As noted under nurse triage dated 05/25/2023, patient will need to be seen for evaluation first. That message was forwarded to nursing staff.  I will be closing this note to reduce duplicates.

## 2023-05-29 ENCOUNTER — Other Ambulatory Visit: Payer: Self-pay

## 2023-05-29 ENCOUNTER — Encounter: Payer: Self-pay | Admitting: Internal Medicine

## 2023-05-29 ENCOUNTER — Ambulatory Visit (INDEPENDENT_AMBULATORY_CARE_PROVIDER_SITE_OTHER): Admitting: Internal Medicine

## 2023-05-29 VITALS — BP 128/84 | HR 90 | Temp 98.2°F | Resp 16 | Ht 65.0 in | Wt 130.5 lb

## 2023-05-29 DIAGNOSIS — R062 Wheezing: Secondary | ICD-10-CM | POA: Diagnosis not present

## 2023-05-29 DIAGNOSIS — R051 Acute cough: Secondary | ICD-10-CM | POA: Diagnosis not present

## 2023-05-29 DIAGNOSIS — J00 Acute nasopharyngitis [common cold]: Secondary | ICD-10-CM | POA: Diagnosis not present

## 2023-05-29 MED ORDER — FLUTICASONE PROPIONATE 50 MCG/ACT NA SUSP
2.0000 | Freq: Every day | NASAL | 6 refills | Status: DC
Start: 2023-05-29 — End: 2023-06-14

## 2023-05-29 MED ORDER — BENZONATATE 100 MG PO CAPS
100.0000 mg | ORAL_CAPSULE | Freq: Two times a day (BID) | ORAL | 0 refills | Status: DC | PRN
Start: 2023-05-29 — End: 2023-06-14

## 2023-05-29 MED ORDER — ALBUTEROL SULFATE HFA 108 (90 BASE) MCG/ACT IN AERS
2.0000 | INHALATION_SPRAY | Freq: Four times a day (QID) | RESPIRATORY_TRACT | 2 refills | Status: DC | PRN
Start: 2023-05-29 — End: 2023-06-14

## 2023-05-29 NOTE — Progress Notes (Signed)
 Acute Office Visit  Subjective:     Patient ID: Patricia Decker, female    DOB: 01-Jul-1955, 68 y.o.   MRN: [id number]  Chief Complaint  Patient presents with   Sinusitis    Cough, runny nose for 1 week    Sinusitis Associated symptoms include congestion and coughing. Pertinent negatives include no chills, ear pain, shortness of breath or sore throat.   Patient is in today for cough and rhinorrhea. Symptoms started last week.   -Fever: no -Cough: yes, productive with clear mucus -Shortness of breath: no -Wheezing: no -Nasal congestion: yes - worse when laying flat -Runny nose: yes, clear rhinorrhea  -Post nasal drip: no -Sneezing: no -Sore throat: no -Sinus pressure: no -Headache: no -Ear pain: no  -Ear pressure: yes bilateral -Sick contacts: no  -Context: fluctuating -Treatments attempted: none    Review of Systems  Constitutional:  Negative for chills and fever.  HENT:  Positive for congestion. Negative for ear pain, sinus pain and sore throat.   Respiratory:  Positive for cough. Negative for shortness of breath and wheezing.   Cardiovascular:  Negative for chest pain.        Objective:    BP 128/84 (Cuff Size: Normal)   Pulse 90   Temp 98.2 F (36.8 C) (Oral)   Resp 16   Ht 5\' 5"  (1.651 m)   Wt 130 lb 8 oz (59.2 kg)   SpO2 98%   BMI 21.72 kg/m    Physical Exam Constitutional:      Appearance: Normal appearance.  HENT:     Head: Normocephalic and atraumatic.     Right Ear: Tympanic membrane, ear canal and external ear normal.     Left Ear: Tympanic membrane, ear canal and external ear normal.     Nose: Nose normal.     Mouth/Throat:     Mouth: Mucous membranes are moist.     Pharynx: Oropharynx is clear.  Eyes:     Conjunctiva/sclera: Conjunctivae normal.  Cardiovascular:     Rate and Rhythm: Normal rate and regular rhythm.  Pulmonary:     Effort: Pulmonary effort is normal.     Breath sounds: Wheezing present.     Comments: Mild  inspiratory wheezing in the bases Skin:    General: Skin is warm.  Neurological:     General: No focal deficit present.     Mental Status: She is alert. Mental status is at baseline.  Psychiatric:        Mood and Affect: Mood normal.        Behavior: Behavior normal.     No results found for any visits on 05/29/23.      Assessment & Plan:   1. Acute rhinitis (Primary)/Wheezing/Acute cough: Suspect symptoms are more allergic rhinitis, recommend oral anti-histamine and can use nasal saline and nasal steroid to help with congestion and rhinorrhea. Will prescribe cough suppressant to take at night and inhaler to use as needed for wheezing/SOB. Follow up if symptoms worsen or fail to improve.   - benzonatate (TESSALON) 100 MG capsule; Take 1 capsule (100 mg total) by mouth 2 (two) times daily as needed for cough.  Dispense: 20 capsule; Refill: 0 - fluticasone (FLONASE) 50 MCG/ACT nasal spray; Place 2 sprays into both nostrils daily.  Dispense: 16 g; Refill: 6 - albuterol (VENTOLIN HFA) 108 (90 Base) MCG/ACT inhaler; Inhale 2 puffs into the lungs every 6 (six) hours as needed for wheezing or shortness of breath.  Dispense: 8 g;  Refill: 2   Return if symptoms worsen or fail to improve.  Margarita Mail, DO

## 2023-05-29 NOTE — Patient Instructions (Signed)
 It was great seeing you today!  Plan discussed at today's visit: -Recommend nasal saline and nasal steroid (Flonase) 2 sprays in each side twice a day -Also recommend Claritin -Will prescribe cough suppressant and Albuterol inhaler to use as needed every 4-6 hours for shortness of breath or wheezing   Follow up in: as needed   Take care and let us know if you have any questions or concerns prior to your next visit.  Dr. Caralee Ates

## 2023-05-29 NOTE — Telephone Encounter (Signed)
 Pt scheduled at [facility] today with Caralee Ates

## 2023-05-29 NOTE — Telephone Encounter (Signed)
 Duplicate. Please see telephone encounter from 05/25/23

## 2023-05-30 NOTE — Telephone Encounter (Signed)
 Patient seen by [facility]

## 2023-06-09 ENCOUNTER — Encounter: Payer: Medicare Other | Admitting: Family Medicine

## 2023-06-14 ENCOUNTER — Ambulatory Visit (INDEPENDENT_AMBULATORY_CARE_PROVIDER_SITE_OTHER): Payer: Self-pay | Admitting: Family Medicine

## 2023-06-14 ENCOUNTER — Encounter: Payer: Self-pay | Admitting: Family Medicine

## 2023-06-14 VITALS — BP 131/69 | HR 66 | Ht 65.0 in | Wt 130.0 lb

## 2023-06-14 DIAGNOSIS — D649 Anemia, unspecified: Secondary | ICD-10-CM

## 2023-06-14 DIAGNOSIS — Z780 Asymptomatic menopausal state: Secondary | ICD-10-CM

## 2023-06-14 DIAGNOSIS — I739 Peripheral vascular disease, unspecified: Secondary | ICD-10-CM

## 2023-06-14 DIAGNOSIS — N182 Chronic kidney disease, stage 2 (mild): Secondary | ICD-10-CM | POA: Diagnosis not present

## 2023-06-14 DIAGNOSIS — Z13820 Encounter for screening for osteoporosis: Secondary | ICD-10-CM

## 2023-06-14 NOTE — Patient Instructions (Addendum)
 Please call the [facility] [phone]) to schedule a routine screening mammogram and bone density test.    Recommended vaccines: Tdap (tetanus)

## 2023-06-14 NOTE — Progress Notes (Signed)
 Established patient visit   Patient: Patricia Decker   DOB: Jan 03, 1956   68 y.o. Female  MRN: [id number] Visit Date: 06/14/2023  Today's healthcare provider: Sherlyn Hay, DO   Chief Complaint  Patient presents with   Follow-up    Follow up on chronic conditions,  No acute concerns   refferal    Dexa Scan   Subjective    HPI Patricia Decker is a 68 year old female who presents for a follow-up visit and to obtain an order for a DEXA scan.  She has a history of peripheral arterial disease and had a stent placed two years ago due to reduced blood flow. She is currently on Plavix and Lipitor for management. She no longer takes Lawyer and manages her symptoms with over-the-counter [place] without DM, which has been effective. She experiences wheezing and nasal congestion, which she attributes to increased pollen and allergies that have developed in recent years.  She has a history of anemia, which she has experienced intermittently throughout her life. She is not currently taking an iron supplement but acknowledges that it runs in her family. Her diet includes salads, turnip greens, mustard greens, green beans, steak, lamb chops, and she avoids pork due to indigestion. She reports occasional fatigue, which she suspects may be related to her anemia. Despite adequate sleep, she sometimes experiences fatigue. No chest pain, shortness of breath, headaches, dizziness, or fever.  She experiences anxiety, particularly around anniversaries of her son's and husband's deaths. She describes episodes of scratching and rashes, which she associates with anxiety. She lives alone and is the youngest of her siblings, with no children.  She has not had the pneumonia, flu, or shingles vaccines, although she has had two COVID boosters. She cannot recall her last tetanus vaccine. She had cataract surgery and has an upcoming eye appointment. She has had two colonoscopies, the first in her 10s, and  is due for another in 2028.      Medications: Outpatient Medications Prior to Visit  Medication Sig   aspirin EC 81 MG tablet Take 1 tablet (81 mg total) by mouth daily.   atorvastatin (LIPITOR) 10 MG tablet TAKE 1 TABLET BY MOUTH ONCE EVERY EVENING   clopidogrel (PLAVIX) 75 MG tablet TAKE 1 TABLET BY MOUTH ONCE DAILY   [DISCONTINUED] albuterol (VENTOLIN HFA) 108 (90 Base) MCG/ACT inhaler Inhale 2 puffs into the lungs every 6 (six) hours as needed for wheezing or shortness of breath. (Patient not taking: Reported on 06/14/2023)   [DISCONTINUED] benzonatate (TESSALON) 100 MG capsule Take 1 capsule (100 mg total) by mouth 2 (two) times daily as needed for cough. (Patient not taking: Reported on 06/14/2023)   [DISCONTINUED] fluticasone (FLONASE) 50 MCG/ACT nasal spray Place 2 sprays into both nostrils daily. (Patient not taking: Reported on 06/14/2023)   No facility-administered medications prior to visit.    Review of Systems  Constitutional:  Negative for chills, fatigue and fever.  HENT:  Negative for congestion, ear pain, rhinorrhea, sneezing and sore throat.   Eyes: Negative.  Negative for pain and redness.  Respiratory:  Negative for cough, shortness of breath and wheezing.   Cardiovascular:  Negative for chest pain and leg swelling.  Gastrointestinal:  Negative for abdominal pain, blood in stool, constipation, diarrhea and nausea.  Endocrine: Negative for polydipsia and polyphagia.  Genitourinary: Negative.  Negative for dysuria, flank pain, hematuria, pelvic pain, vaginal bleeding and vaginal discharge.  Musculoskeletal:  Negative for arthralgias, back pain,  gait problem and joint swelling.  Skin:  Negative for rash.  Neurological: Negative.  Negative for dizziness, tremors, seizures, weakness, light-headedness, numbness and headaches.  Hematological:  Negative for adenopathy.  Psychiatric/Behavioral:  Negative for behavioral problems, confusion and dysphoric mood. The patient is  nervous/anxious (occasional). The patient is not hyperactive.          Objective    BP 131/69   Pulse 66   Ht 5\' 5"  (1.651 m)   Wt 130 lb (59 kg)   SpO2 100%   BMI 21.63 kg/m     Physical Exam Vitals and nursing note reviewed.  Constitutional:      General: She is awake.     Appearance: Normal appearance.  HENT:     Head: Normocephalic and atraumatic.     Right Ear: Tympanic membrane, ear canal and external ear normal.     Left Ear: Tympanic membrane, ear canal and external ear normal.     Nose: Nose normal.     Mouth/Throat:     Mouth: Mucous membranes are moist.     Pharynx: Oropharynx is clear. No oropharyngeal exudate or posterior oropharyngeal erythema.  Eyes:     General: No scleral icterus.    Extraocular Movements: Extraocular movements intact.     Conjunctiva/sclera: Conjunctivae normal.     Pupils: Pupils are equal, round, and reactive to light.  Neck:     Thyroid: No thyromegaly or thyroid tenderness.  Cardiovascular:     Rate and Rhythm: Normal rate and regular rhythm.     Pulses: Normal pulses.     Heart sounds: Normal heart sounds.  Pulmonary:     Effort: Pulmonary effort is normal. No tachypnea, bradypnea or respiratory distress.     Breath sounds: Normal breath sounds. No stridor. No wheezing, rhonchi or rales.  Abdominal:     General: Bowel sounds are normal. There is no distension.     Palpations: Abdomen is soft. There is no mass.     Tenderness: There is no abdominal tenderness. There is no guarding.     Hernia: No hernia is present.  Musculoskeletal:     Cervical back: Normal range of motion and neck supple.     Right lower leg: No edema.     Left lower leg: No edema.  Lymphadenopathy:     Cervical: No cervical adenopathy.  Skin:    General: Skin is warm and dry.  Neurological:     Mental Status: She is alert and oriented to person, place, and time. Mental status is at baseline.  Psychiatric:        Mood and Affect: Mood normal.         Behavior: Behavior normal.      No results found for any visits on 06/14/23.  Assessment & Plan    PAD (peripheral artery disease) (HCC) -     Comprehensive metabolic panel with GFR -     Lipid panel  Normocytic anemia  Chronic kidney disease, stage 2, mildly decreased GFR -     Microalbumin / creatinine urine ratio -     Comprehensive metabolic panel with GFR -     Lipid panel  Encounter for osteoporosis screening in asymptomatic postmenopausal patient -     DG Bone Density; Future     Peripheral Arterial Disease (PAD) PAD with stent placed two years ago. Reports leg discomfort, likely stent-related. No recent vascular follow-up. - Continue clopidogrel and atorvastatin. - Schedule follow-up with vascular specialist.  Anemia Chronic  anemia with occasional fatigue. No current iron supplementation. Discussed dietary causes and potential need for supplementation. - Consider initiating iron supplementation. - Monitor dietary intake of iron-rich foods. - Evaluate for other potential causes of anemia if symptoms persist.  Chronic Kidney Disease Stage 2 CKD Stage 2. Management focused on blood pressure, blood sugar, and cholesterol control. Discussed checking vitamin D level, given associated chronic fatigue, but vitamin D level not covered by Medicare. Defer for now. - Monitor kidney function with regular blood work. - Ensure management of blood pressure, blood sugar, and cholesterol.  General Health Maintenance Vaccinations discussed. Increased pneumonia risk for those over sixty. Missing pneumonia, flu, shingles vaccines. Last tetanus vaccine years ago. COVID booster not received this year. - Recommend tetanus (Tdap) vaccine at the pharmacy. - Recommend pneumonia vaccine; patient declined for now. - Discuss benefits of flu and shingles vaccines at the pharmacy; patient declined for now. - Encourage COVID booster vaccination at the pharmacy. - Order DEXA  scan.  Follow-up Annual wellness visit scheduled. Blood work planned for metabolic panel, cholesterol, and kidney function.  - Attend annual wellness visit as scheduled. - Return for fasting blood work for metabolic panel, cholesterol, and kidney function.   Return in about 1 year (around 06/13/2024) for CPE.      I discussed the assessment and treatment plan with the patient  The patient was provided an opportunity to ask questions and all were answered. The patient agreed with the plan and demonstrated an understanding of the instructions.   The patient was advised to call back or seek an in-person evaluation if the symptoms worsen or if the condition fails to improve as anticipated.    Sherlyn Hay, DO  [facility] [phone] (phone) [phone] (fax)  [facility]

## 2023-06-27 DIAGNOSIS — H34231 Retinal artery branch occlusion, right eye: Secondary | ICD-10-CM | POA: Diagnosis not present

## 2023-07-11 IMAGING — MG MM DIGITAL SCREENING BILAT W/ TOMO AND CAD
8 series · 9 of 24 positions shown · non-contrast
Comparison: Previous exam(s).

CLINICAL DATA: Screening.

EXAM:
DIGITAL SCREENING BILATERAL MAMMOGRAM WITH TOMOSYNTHESIS AND CAD
TECHNIQUE: Bilateral screening digital craniocaudal and mediolateral oblique
mammograms were obtained. Bilateral screening digital breast
tomosynthesis was performed. The images were evaluated with
computer-aided detection.

[R MLO synth-2D]
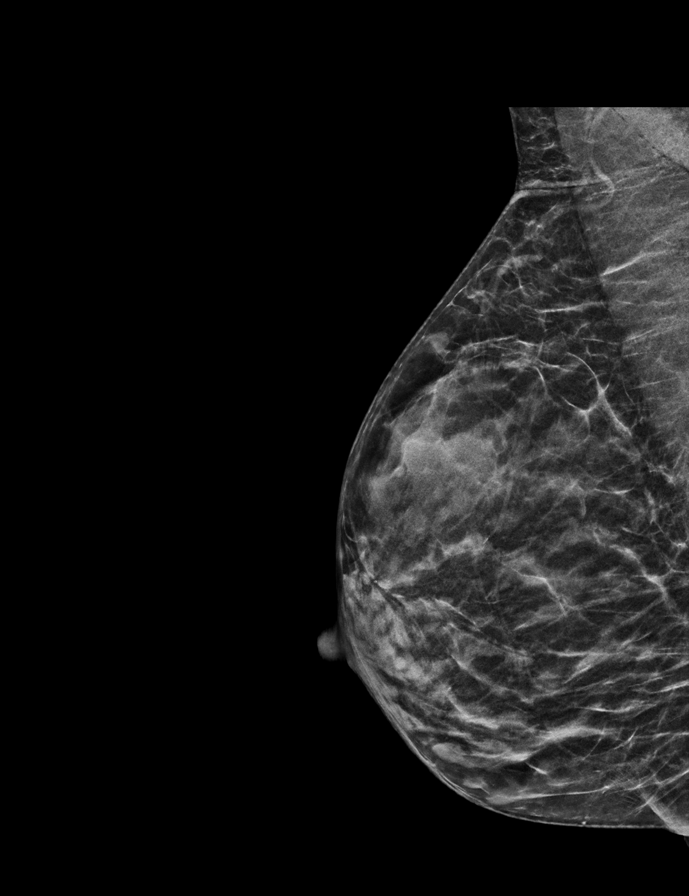

[L MLO synth-2D]
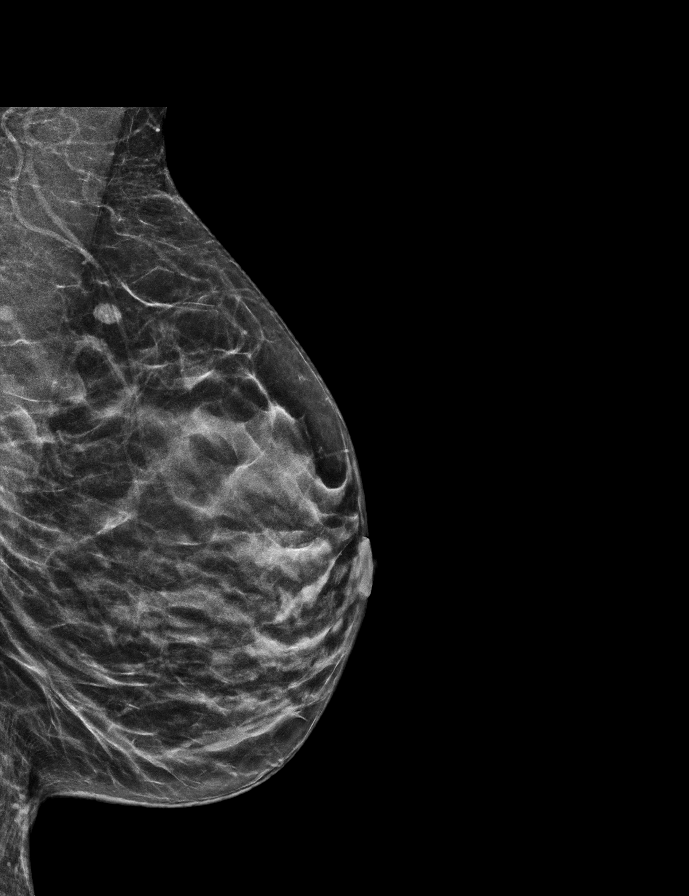

[R CC synth-2D]
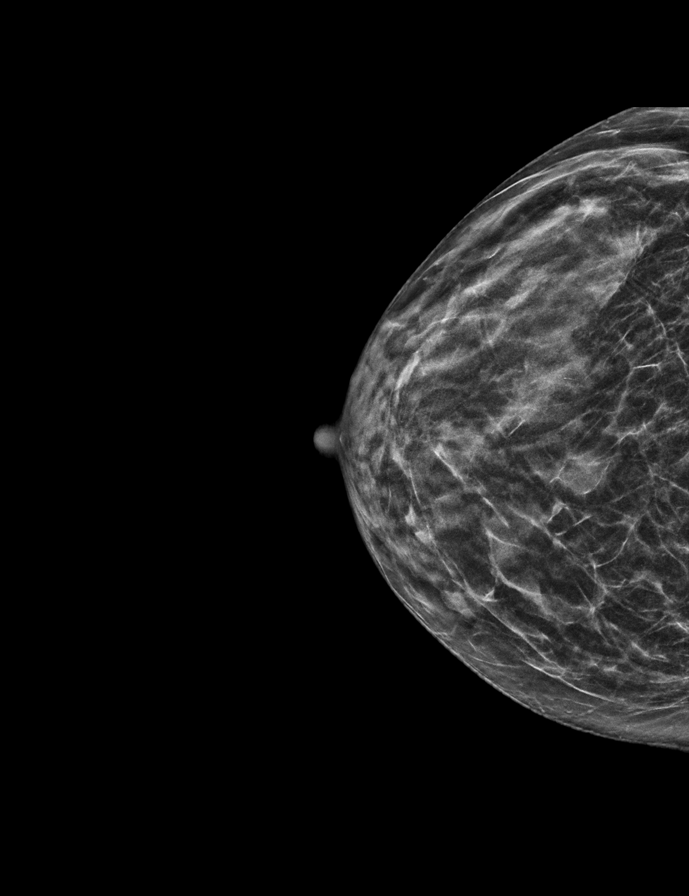

[L CC synth-2D]
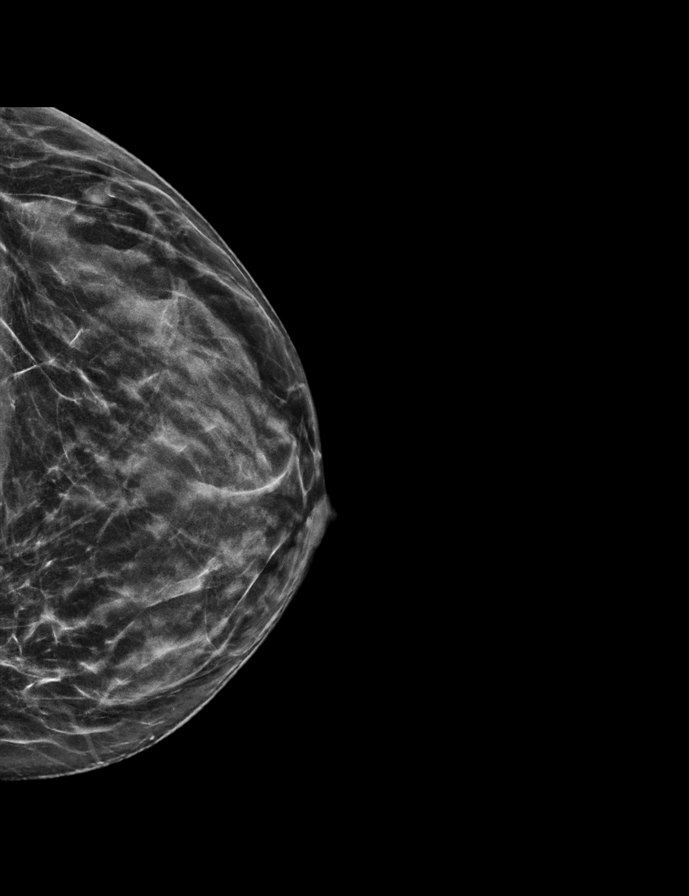

[L CC tomo · 2 of 50 frames shown]
[frame 17/50]
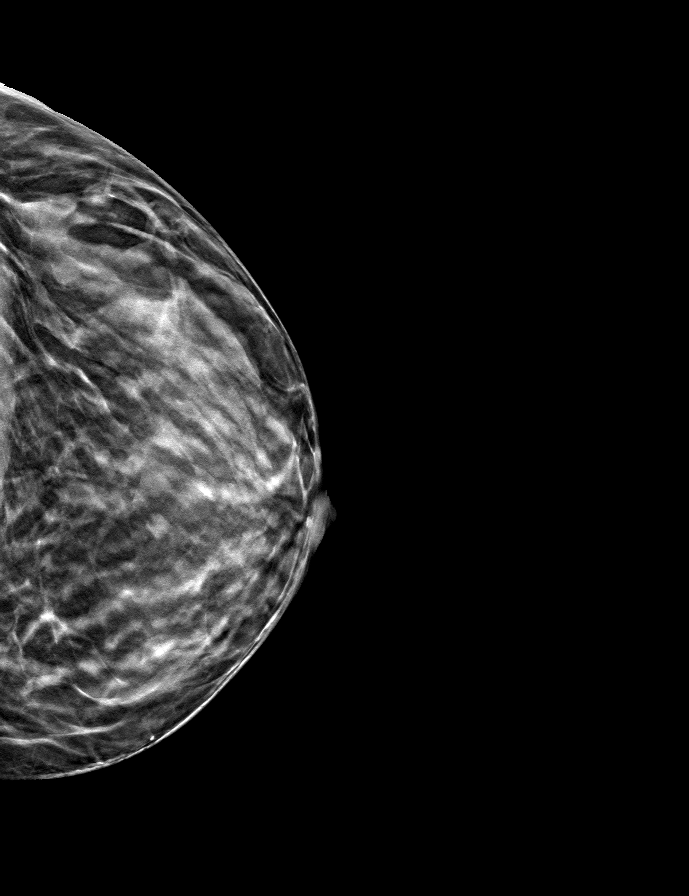
[frame 25/50]
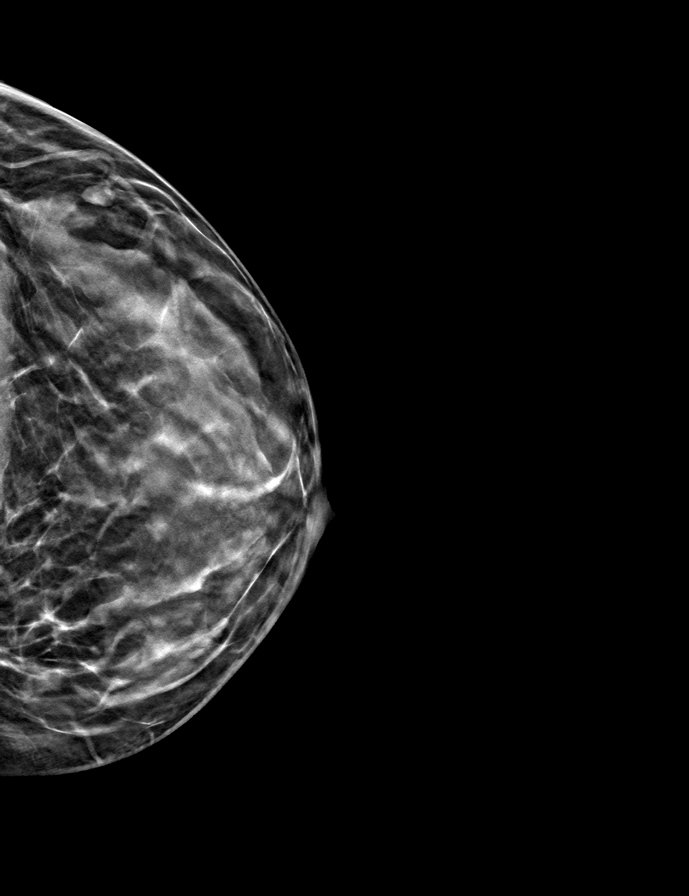

[R CC tomo · tomo slice 22/43.0]
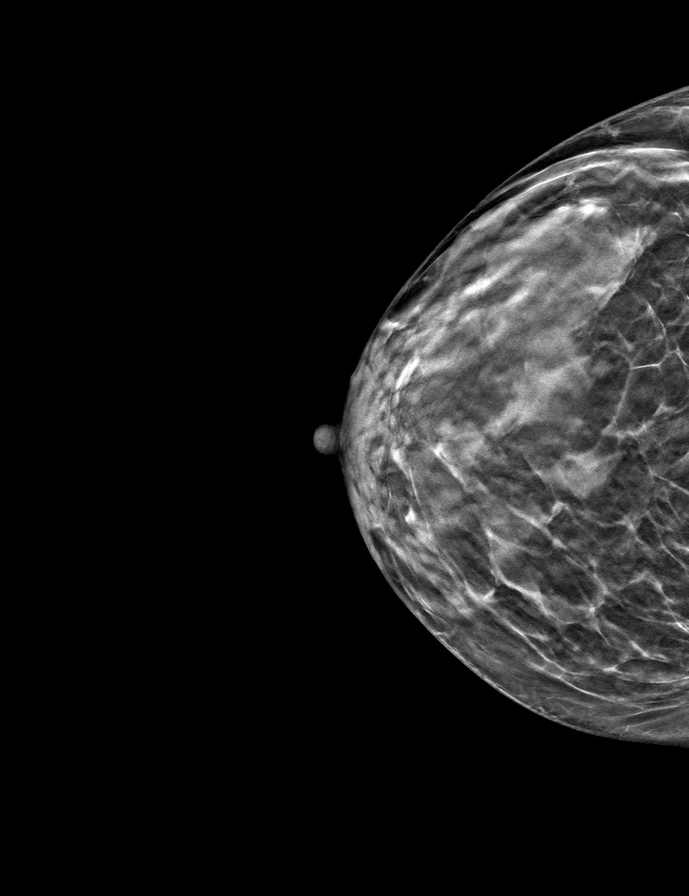

[L MLO tomo · tomo slice 23/46.0]
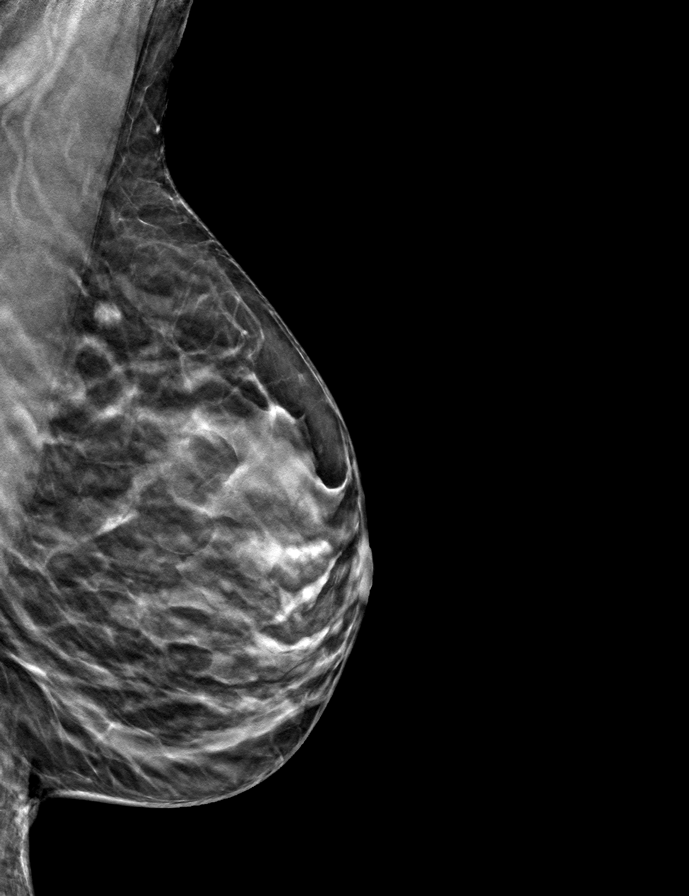

[R MLO tomo · tomo slice 21/41.0]
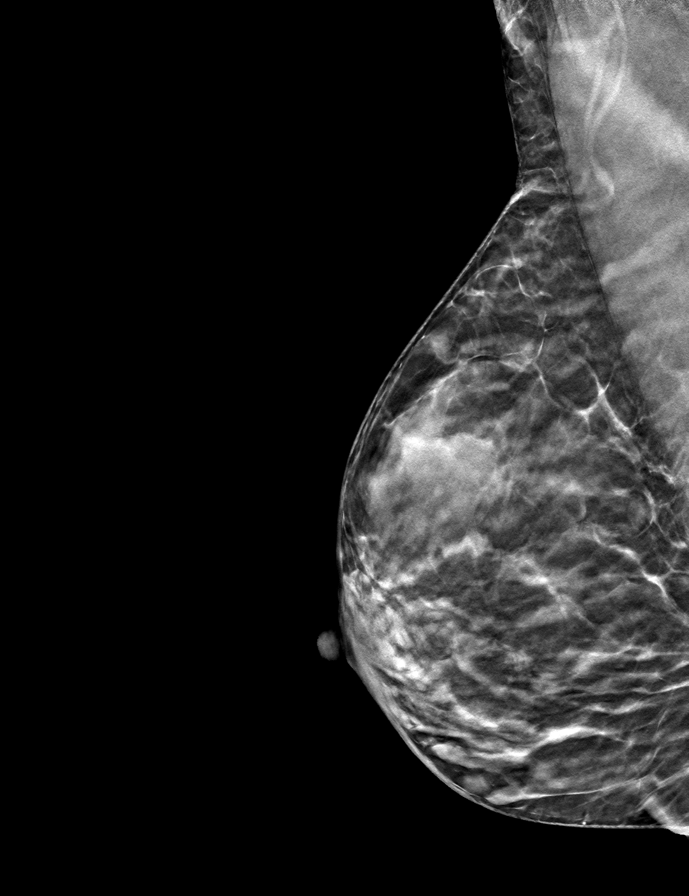

[9 of 24 positions shown; findings below may reference images not displayed]

ACR Breast Density Category c: The breast tissue is heterogeneously
dense, which may obscure small masses.
FINDINGS: There are no findings suspicious for malignancy.
IMPRESSION: No mammographic evidence of malignancy. A result letter of this
screening mammogram will be mailed directly to the patient.

RECOMMENDATION:
Screening mammogram in one year. (Code:Q3-W-BC3)

BI-RADS CATEGORY  1: Negative.

## 2023-09-06 ENCOUNTER — Ambulatory Visit: Payer: Medicare Other

## 2023-09-06 DIAGNOSIS — Z0000 Encounter for general adult medical examination without abnormal findings: Secondary | ICD-10-CM

## 2023-09-06 NOTE — Progress Notes (Signed)
 Subjective:   Patricia Decker is a 68 y.o. who presents for a Medicare Wellness preventive visit.  As a reminder, Annual Wellness Visits don't include a physical exam, and some assessments may be limited, especially if this visit is performed virtually. We may recommend an in-person follow-up visit with your provider if needed.  Visit Complete: Virtual I connected with  Patricia Decker on 09/06/23 by a audio enabled telemedicine application and verified that I am speaking with the correct person using two identifiers.  Patient Location: Home  Provider Location: Home Office  I discussed the limitations of evaluation and management by telemedicine. The patient expressed understanding and agreed to proceed.  Vital Signs: Because this visit was a virtual/telehealth visit, some criteria may be missing or patient reported. Any vitals not documented were not able to be obtained and vitals that have been documented are patient reported.  VideoDeclined- This patient declined Librarian, academic. Therefore the visit was completed with audio only.  Persons Participating in Visit: Patient.  AWV Questionnaire: No: Patient Medicare AWV questionnaire was not completed prior to this visit.  Cardiac Risk Factors include: advanced age (>74men, >59 women);dyslipidemia     Objective:    There were no vitals filed for this visit. There is no height or weight on file to calculate BMI.     09/06/2023    8:57 AM 09/05/2022    8:59 AM 08/21/2021    7:15 AM 06/28/2021   10:38 AM 04/02/2021   12:07 PM 07/31/2019    6:53 AM 07/10/2019    9:36 AM  Advanced Directives  Does Patient Have a Medical Advance Directive? No No No No No No No  Does patient want to make changes to medical advance directive?     No - Patient declined    Would patient like information on creating a medical advance directive? No - Patient declined   No - Patient declined No - Patient declined No - Patient  declined No - Patient declined    Current Medications (verified) Outpatient Encounter Medications as of 09/06/2023  Medication Sig   aspirin  EC 81 MG tablet Take 1 tablet (81 mg total) by mouth daily.   atorvastatin  (LIPITOR) 10 MG tablet TAKE 1 TABLET BY MOUTH ONCE EVERY EVENING   clopidogrel  (PLAVIX ) 75 MG tablet TAKE 1 TABLET BY MOUTH ONCE DAILY   No facility-administered encounter medications on file as of 09/06/2023.    Allergies (verified) Penicillins, Sulfa antibiotics, and Latex   History: Past Medical History:  Diagnosis Date   Anemia    Anxiety    Arthritis    Asthma    Cataract    removed   Depression    Wears dentures    Full upper, partial lower   Past Surgical History:  Procedure Laterality Date   ABDOMINAL HYSTERECTOMY     APPENDECTOMY     CATARACT EXTRACTION, BILATERAL     CHOLECYSTECTOMY     COLONOSCOPY WITH PROPOFOL  N/A 06/30/2016   Procedure: COLONOSCOPY WITH PROPOFOL ;  Surgeon: Ruel Kung, MD;  Location: [facility] ENDOSCOPY;  Service: Endoscopy;  Laterality: N/A;   EAR CYST EXCISION Left 07/31/2019   Procedure: EXCISION EAR LESION;  Surgeon: Milissa Hamming, MD;  Location: [facility];  Service: ENT;  Laterality: Left;  Latex   LOWER EXTREMITY ANGIOGRAPHY Left 04/02/2021   Procedure: LOWER EXTREMITY ANGIOGRAPHY;  Surgeon: Marea Selinda RAMAN, MD;  Location: [facility] INVASIVE CV LAB;  Service: Cardiovascular;  Laterality: Left;   Family History  Problem Relation Age of Onset   Alzheimer's disease Sister    Breast cancer Sister 62   Social History   Socioeconomic History   Marital status: Widowed    Spouse name: Not on file   Number of children: 1   Years of education: Not on file   Highest education level: Some college, no degree  Occupational History   Occupation: disability  Tobacco Use   Smoking status: Never   Smokeless tobacco: Never  Vaping Use   Vaping status: Never Used  Substance and Sexual Activity   Alcohol use: No   Drug use: No    Sexual activity: Not on file  Other Topics Concern   Not on file  Social History Narrative   Not on file   Social Drivers of Health   Financial Resource Strain: Low Risk  (09/06/2023)   Overall Financial Resource Strain (CARDIA)    Difficulty of Paying Living Expenses: Not hard at all  Food Insecurity: No Food Insecurity (09/06/2023)   Hunger Vital Sign    Worried About Running Out of Food in the Last Year: Never true    Ran Out of Food in the Last Year: Never true  Transportation Needs: No Transportation Needs (09/06/2023)   PRAPARE - Administrator, Civil Service (Medical): No    Lack of Transportation (Non-Medical): No  Physical Activity: Insufficiently Active (09/06/2023)   Exercise Vital Sign    Days of Exercise per Week: 2 days    Minutes of Exercise per Session: 60 min  Stress: No Stress Concern Present (09/06/2023)   [employer] of Occupational Health - Occupational Stress Questionnaire    Feeling of Stress: Not at all  Social Connections: Moderately Isolated (09/06/2023)   Social Connection and Isolation Panel    Frequency of Communication with Friends and Family: More than three times a week    Frequency of Social Gatherings with Friends and Family: Once a week    Attends Religious Services: More than 4 times per year    Active Member of [employer] or Organizations: No    Attends Banker Meetings: Never    Marital Status: Widowed    Tobacco Counseling Counseling given: Not Answered    Clinical Intake:  Pre-visit preparation completed: Yes  Pain : No/denies pain     BMI - recorded: 21.6 Nutritional Status: BMI of 19-24  Normal Nutritional Risks: None Diabetes: No  No results found for: HGBA1C   How often do you need to have someone help you when you read instructions, pamphlets, or other written materials from your doctor or pharmacy?: 1 - Never  Interpreter Needed?: No  Information entered by :: JHONNIE DAS, LPN   Activities of  Daily Living    09/06/2023    8:58 AM  In your present state of health, do you have any difficulty performing the following activities:  Hearing? 0  Vision? 0  Difficulty concentrating or making decisions? 0  Walking or climbing stairs? 0  Dressing or bathing? 0  Doing errands, shopping? 0  Preparing Food and eating ? N  Using the Toilet? N  In the past six months, have you accidently leaked urine? N  Do you have problems with loss of bowel control? N  Managing your Medications? N  Managing your Finances? N  Housekeeping or managing your Housekeeping? N    Patient Care Team: Donzella Lauraine SAILOR, DO as PCP - General (Family Medicine) Cleotilde Barrio, MD as Consulting Physician (Orthopedic Surgery) [facility] [place],  Zachary RAMAN (Optometry)  I have updated your Care Teams any recent Medical Services you may have received from other providers in the past year.     Assessment:   This is a routine wellness examination for Patricia Decker.  Hearing/Vision screen Hearing Screening - Comments:: NO AIDS Vision Screening - Comments:: WEARS GLASSES ALL DAY- DR.THURMOND    Goals Addressed             This Visit's Progress    DIET - REDUCE SALT INTAKE TO 2 GRAMS PER DAY OR LESS         Depression Screen     09/06/2023    8:55 AM 06/14/2023    8:53 AM 09/05/2022    8:50 AM 06/09/2022   10:26 AM 03/17/2022    3:08 PM 06/28/2021   10:35 AM 05/06/2020    1:33 PM  PHQ 2/9 Scores  PHQ - 2 Score 0 0 0 0 0 0 0  PHQ- 9 Score 0    0  0    Fall Risk     09/06/2023    8:58 AM 06/14/2023    8:53 AM 05/29/2023   11:06 AM 09/05/2022    8:48 AM 06/09/2022   10:26 AM  Fall Risk   Falls in the past year? 0 0 0 0 0  Number falls in past yr: 0 0 0 0 0  Injury with Fall? 0 0 0 0 0  Risk for fall due to : No Fall Risks  No Fall Risks No Fall Risks No Fall Risks  Follow up Falls evaluation completed  Falls evaluation completed Education provided;Falls prevention discussed     MEDICARE RISK AT HOME:  Medicare Risk at  Home Any stairs in or around the home?: Yes If so, are there any without handrails?: No Home free of loose throw rugs in walkways, pet beds, electrical cords, etc?: Yes Adequate lighting in your home to reduce risk of falls?: Yes Life alert?: No Use of a cane, walker or w/c?: No Grab bars in the bathroom?: No Shower chair or bench in shower?: Yes Elevated toilet seat or a handicapped toilet?: Yes  TIMED UP AND GO:  Was the test performed?  No  Cognitive Function: 6CIT completed        09/06/2023    9:00 AM 09/05/2022    9:12 AM 07/10/2019    9:41 AM 06/22/2017    8:51 AM  6CIT Screen  What Year? 0 points 0 points 0 points 0 points  What month? 0 points 0 points 0 points 0 points  What time? 0 points 0 points 0 points 0 points  Count back from 20 2 points 0 points 0 points 0 points  Months in reverse 0 points 0 points 0 points 0 points  Repeat phrase 0 points 0 points 0 points 0 points  Total Score 2 points 0 points 0 points 0 points    Immunizations Immunization History  Administered Date(s) Administered   MMR 04/25/2000   Moderna Covid-19 Vaccine Bivalent Booster 40yrs & up 01/29/2020   Moderna Sars-Covid-2 Vaccination 05/22/2019, 06/27/2019    Screening Tests Health Maintenance  Topic Date Due   DEXA SCAN  Never done   DTaP/Tdap/Td (1 - Tdap) 10/09/2023 (Originally 05/10/1974)   COVID-19 Vaccine (4 - 2024-25 season) 12/06/2023 (Originally 11/06/2022)   Pneumococcal Vaccine: 50+ Years (1 of 1 - PCV) 06/13/2024 (Originally 05/11/2007)   Zoster Vaccines- Shingrix (1 of 2) 06/13/2024 (Originally 05/11/2007)   INFLUENZA VACCINE  10/06/2023  MAMMOGRAM  04/30/2024   Medicare Annual Wellness (AWV)  09/05/2024   Colonoscopy  07/01/2026   Hepatitis C Screening  Completed   Hepatitis B Vaccines  Aged Out   HPV VACCINES  Aged Out   Meningococcal B Vaccine  Aged Out    Health Maintenance  Health Maintenance Due  Topic Date Due   DEXA SCAN  Never done   Health Maintenance  Items Addressed: UP TO DATE ON MAMMOGRAM, BDS, & COLONOSCOPY; DECLINES ALL SHOTS  Additional Screening:  Vision Screening: Recommended annual ophthalmology exams for early detection of glaucoma and other disorders of the eye. Would you like a referral to an eye doctor? No    Dental Screening: Recommended annual dental exams for proper oral hygiene  Community Resource Referral / Chronic Care Management: CRR required this visit?  No   CCM required this visit?  No   Plan:    I have personally reviewed and noted the following in the patient's chart:   Medical and social history Use of alcohol, tobacco or illicit drugs  Current medications and supplements including opioid prescriptions. Patient is not currently taking opioid prescriptions. Functional ability and status Nutritional status Physical activity Advanced directives List of other physicians Hospitalizations, surgeries, and ER visits in previous 12 months Vitals Screenings to include cognitive, depression, and falls Referrals and appointments  In addition, I have reviewed and discussed with patient certain preventive protocols, quality metrics, and best practice recommendations. A written personalized care plan for preventive services as well as general preventive health recommendations were provided to patient.   Jhonnie GORMAN Das, LPN   04/13/7972   After Visit Summary: (MyChart) Due to this being a telephonic visit, the after visit summary with patients personalized plan was offered to patient via MyChart   Notes: Nothing significant to report at this time.

## 2023-09-06 NOTE — Patient Instructions (Addendum)
 Patricia Decker , Thank you for taking time out of your busy schedule to complete your Annual Wellness Visit with me. I enjoyed our conversation and look forward to speaking with you again next year. I, as well as your care team,  appreciate your ongoing commitment to your health goals. Please review the following plan we discussed and let me know if I can assist you in the future.   Follow up Visits: Next Medicare AWV with our clinical staff:   09/10/24 @ 3:50 PM BY PHONE Have you seen your provider in the last 6 months (3 months if uncontrolled diabetes)? Yes  Clinician Recommendations:  Aim for 30 minutes of exercise or brisk walking, 6-8 glasses of water, and 5 servings of fruits and vegetables each day. TAKE CARE!      This is a list of the screening recommended for you and due dates:  Health Maintenance  Topic Date Due   DEXA scan (bone density measurement)  Never done   DTaP/Tdap/Td vaccine (1 - Tdap) 10/09/2023*   COVID-19 Vaccine (4 - 2024-25 season) 12/06/2023*   Pneumococcal Vaccine for age over 84 (1 of 1 - PCV) 06/13/2024*   Zoster (Shingles) Vaccine (1 of 2) 06/13/2024*   Flu Shot  10/06/2023   Mammogram  04/30/2024   Medicare Annual Wellness Visit  09/05/2024   Colon Cancer Screening  07/01/2026   Hepatitis C Screening  Completed   Hepatitis B Vaccine  Aged Out   HPV Vaccine  Aged Out   Meningitis B Vaccine  Aged Out  *Topic was postponed. The date shown is not the original due date.    Advanced directives: (ACP Link)Information on Advanced Care Planning can be found at [place]  Secretary of [facility] Advance Health Care Directives Advance Health Care Directives. [email or web address]  Advance Care Planning is important because it:  [x]  Makes sure you receive the medical care that is consistent with your values, goals, and preferences  [x]  It provides guidance to your family and loved ones and reduces their decisional burden about whether or not they are making the right decisions  based on your wishes.  Follow the link provided in your after visit summary or read over the paperwork we have mailed to you to help you started getting your Advance Directives in place. If you need assistance in completing these, please reach out to us  so that we can help you!

## 2023-10-17 ENCOUNTER — Other Ambulatory Visit

## 2023-11-08 IMAGING — US US EXTREM LOW VENOUS*L*
1 series · 14 of 24 positions shown · non-contrast
Comparison: None Available.

CLINICAL DATA: 66-year-old female with lower extremity pain.

EXAM:
LEFT LOWER EXTREMITY VENOUS DOPPLER ULTRASOUND
TECHNIQUE: Gray-scale sonography with compression, as well as color and duplex
ultrasound, were performed to evaluate the deep venous system(s)
from the level of the common femoral vein through the popliteal and
proximal calf veins.

[Series 1: us venous img lower uni left (dvt) · portal-venous · 14 of 31 slices shown]
[im 1/31]
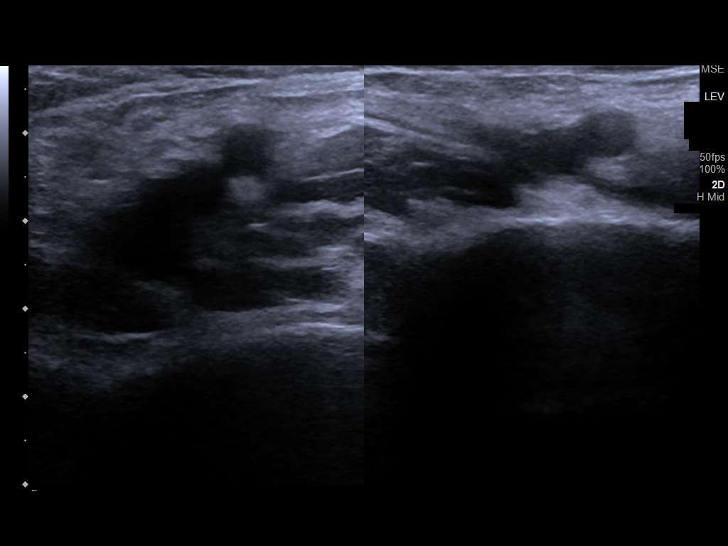
[im 3/31]
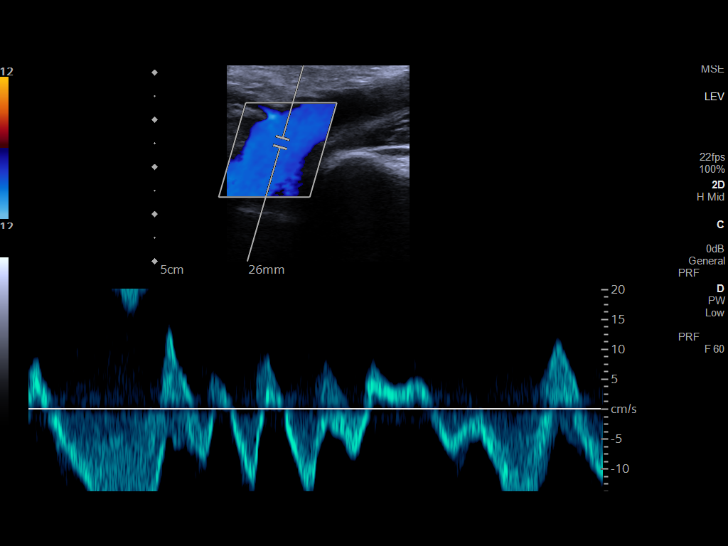
[im 6/31]
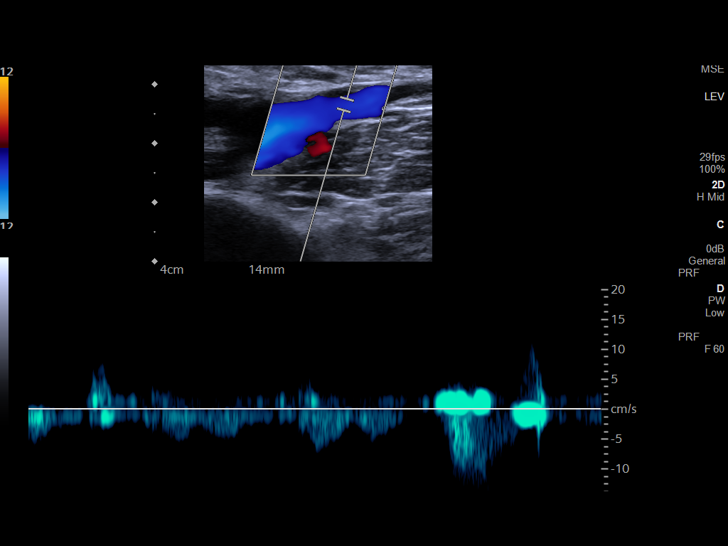
[im 8/31]
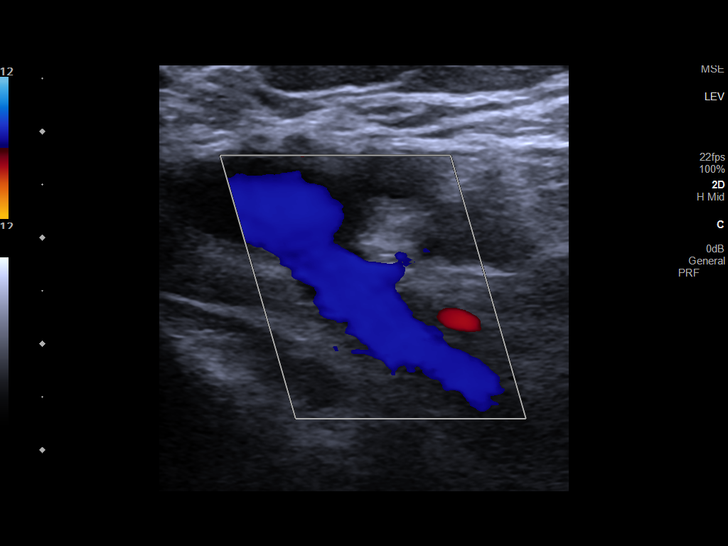
[im 10/31]
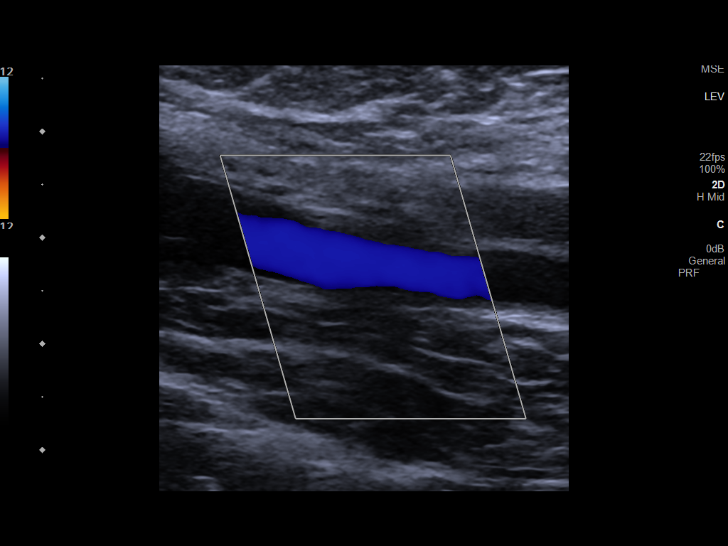
[im 12/31]
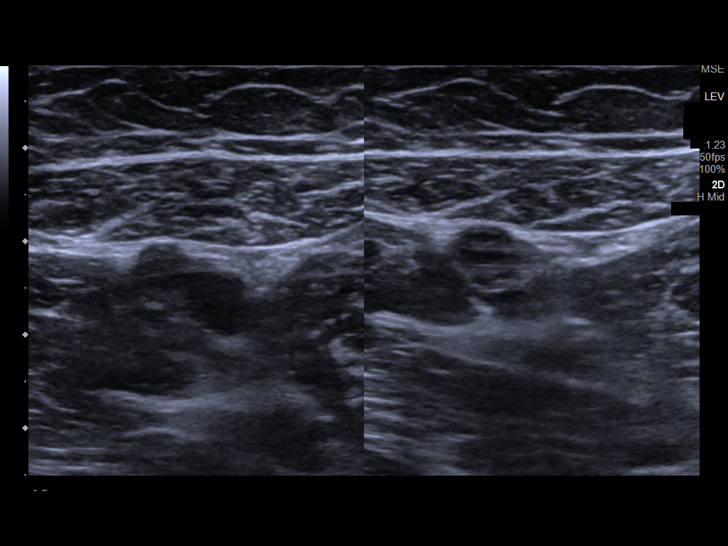
[im 15/31]
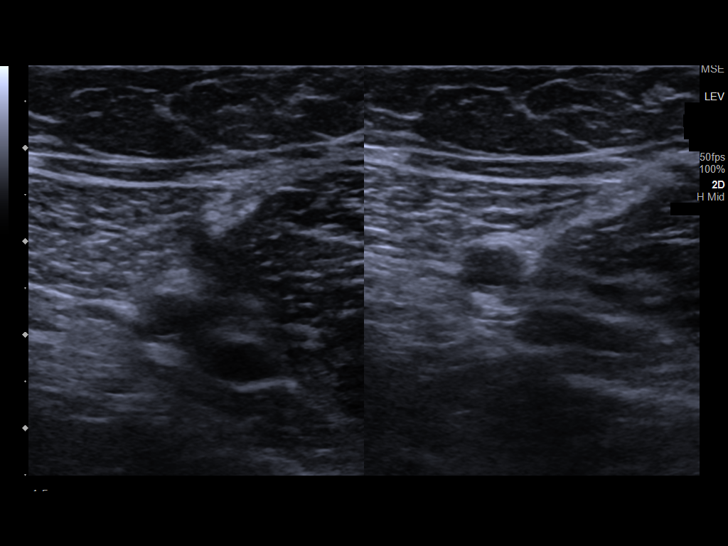
[im 16/31]
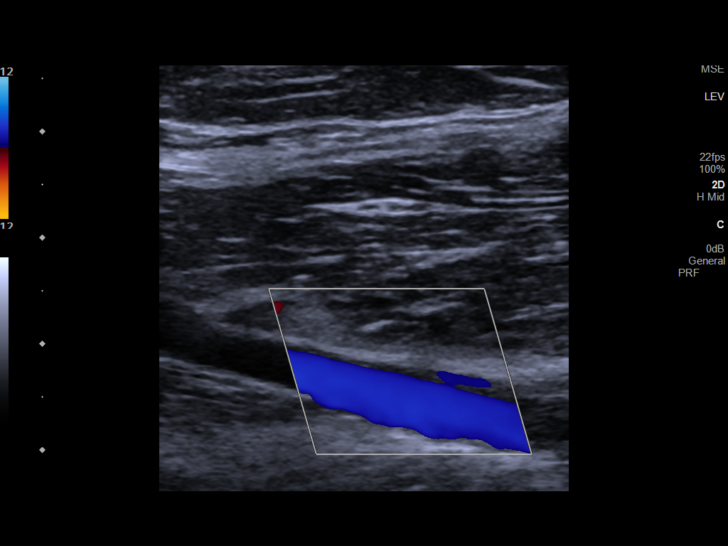
[im 19/31]
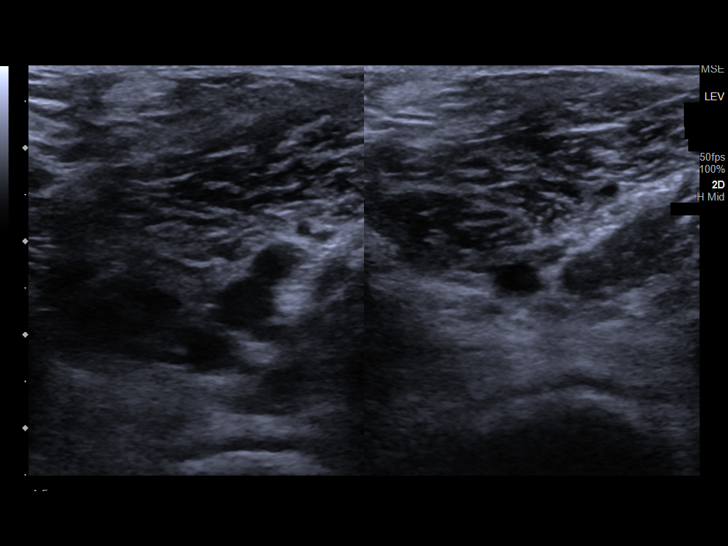
[im 21/31]
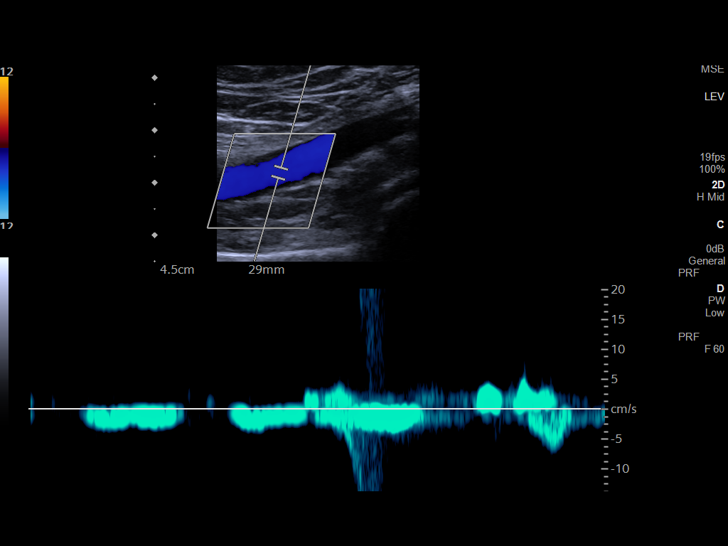
[im 24/31]
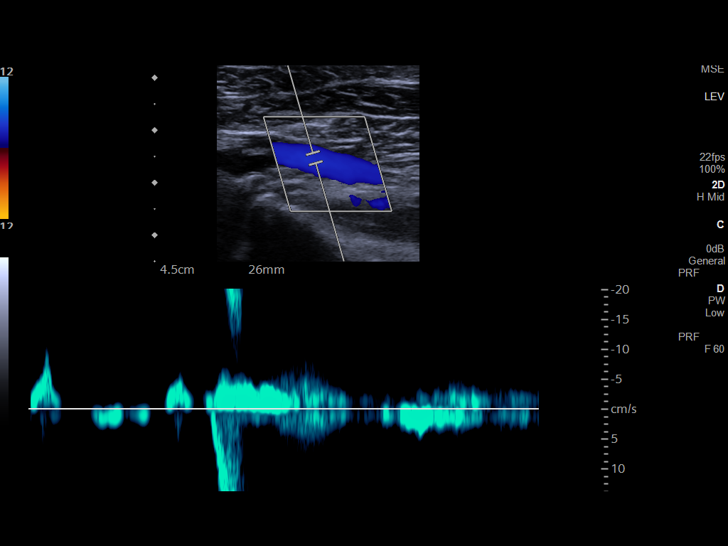
[im 25/31]
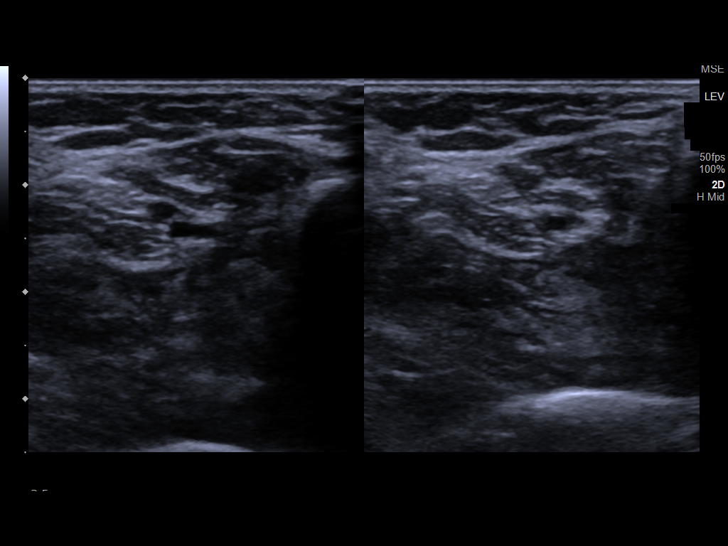
[im 28/31]
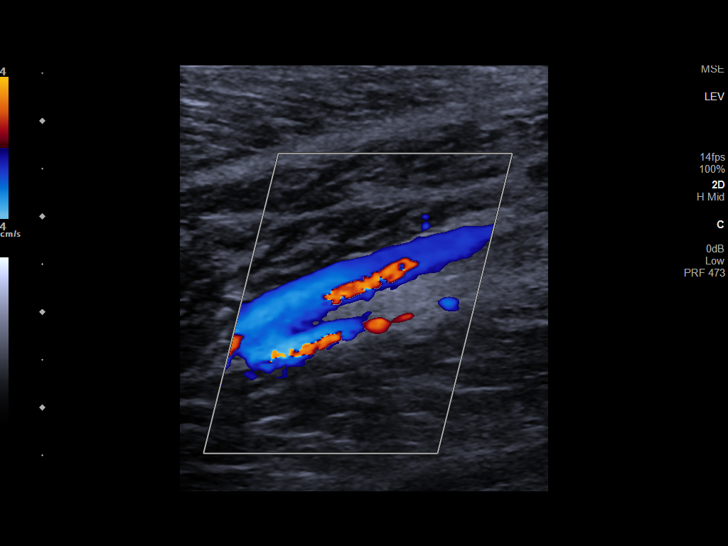
[im 31/31]
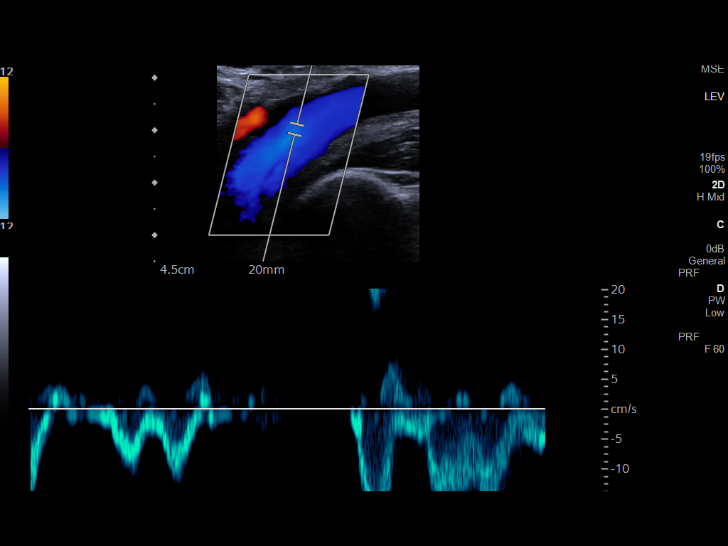

[14 of 24 positions shown; findings below may reference images not displayed]

FINDINGS: VENOUS

Normal compressibility of the common femoral, superficial femoral,
and popliteal veins, as well as the visualized calf veins.
Visualized portions of profunda femoral vein and great saphenous
vein unremarkable. No filling defects to suggest DVT on grayscale or
color Doppler imaging. Doppler waveforms show normal direction of
venous flow, normal respiratory plasticity and response to
augmentation.

Limited views of the contralateral common femoral vein are
unremarkable.

OTHER

None.

Limitations: none
IMPRESSION: No evidence of left lower extremity deep venous thrombosis.

## 2023-11-08 IMAGING — CR DG HIP (WITH OR WITHOUT PELVIS) 2-3V*L*
1 series · 3 of 3 positions shown · non-contrast
Comparison: No priors.

CLINICAL DATA: 66-year-old female with history of left hip pain
after a fall.

EXAM:
DG HIP (WITH OR WITHOUT PELVIS) 2-3V LEFT

[Series 1: dg hip unilat w or w/o pelvis 2-3 views  · non-contrast · 0.14mm/px · 3 of 3 slices shown]
[im 1/3]
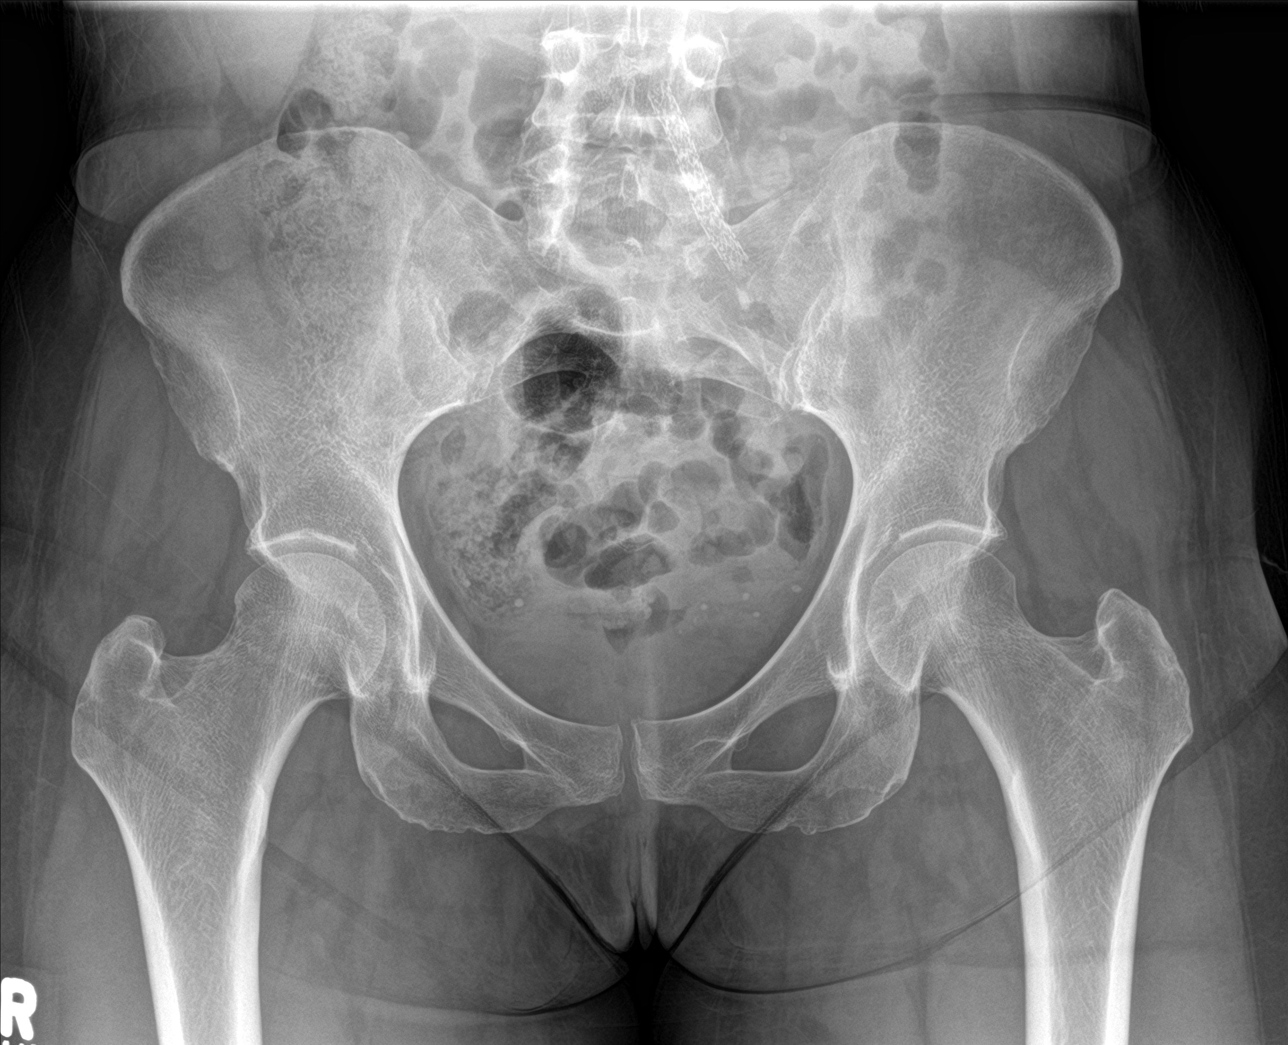
[im 2/3]
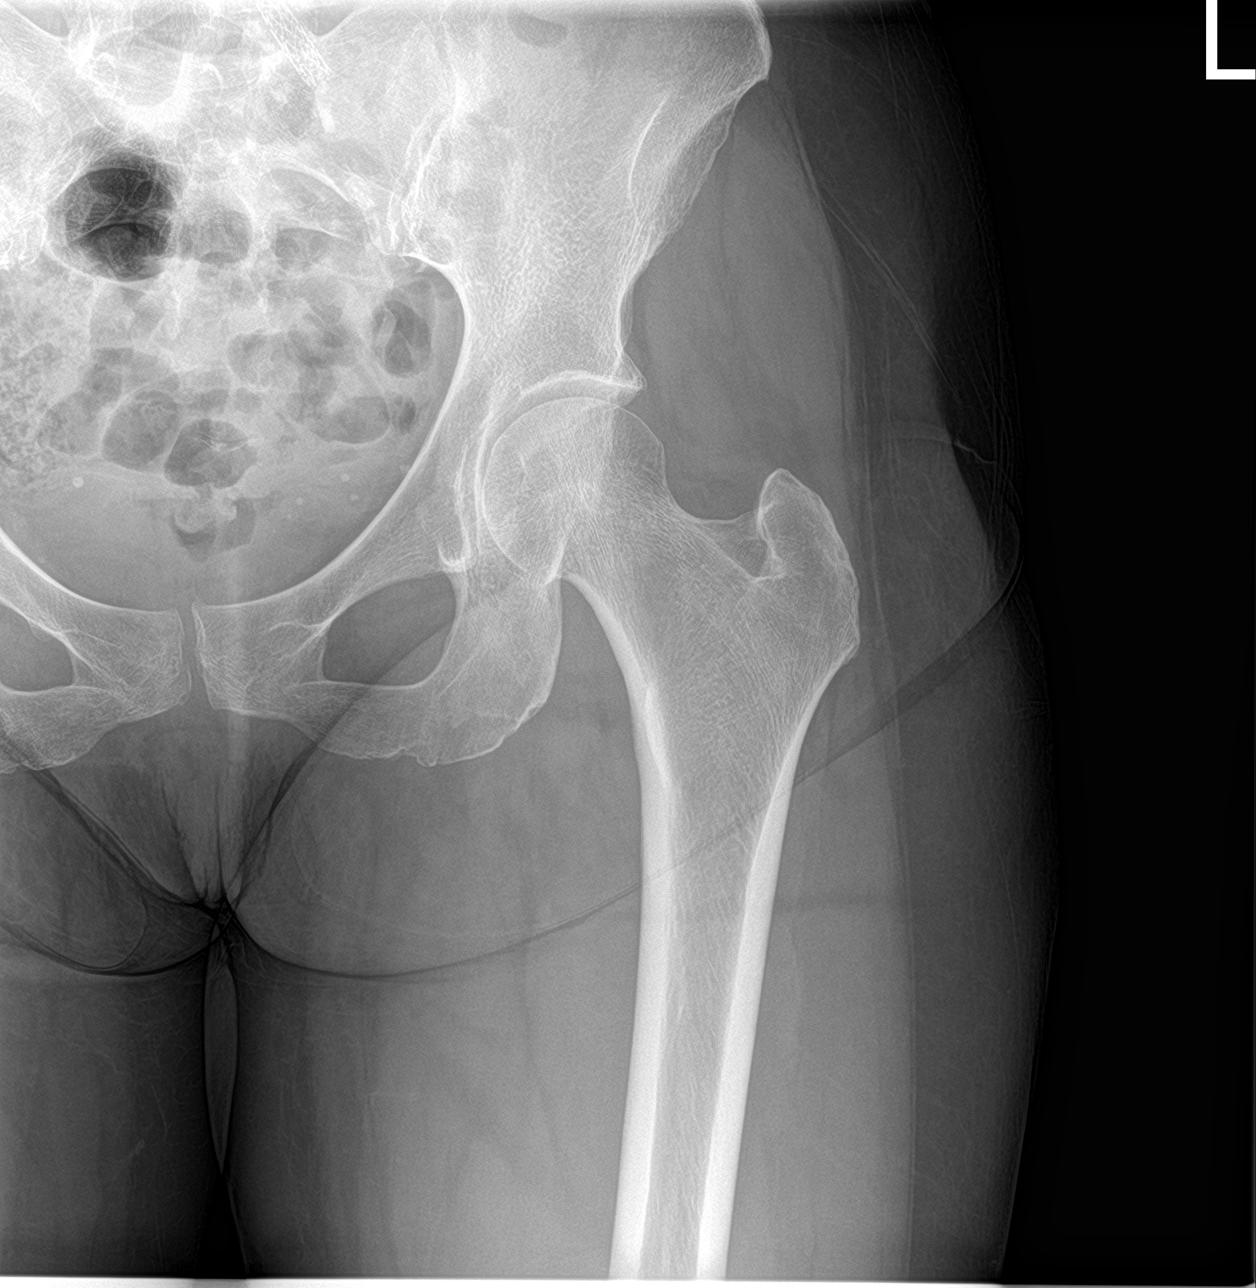
[im 3/3]
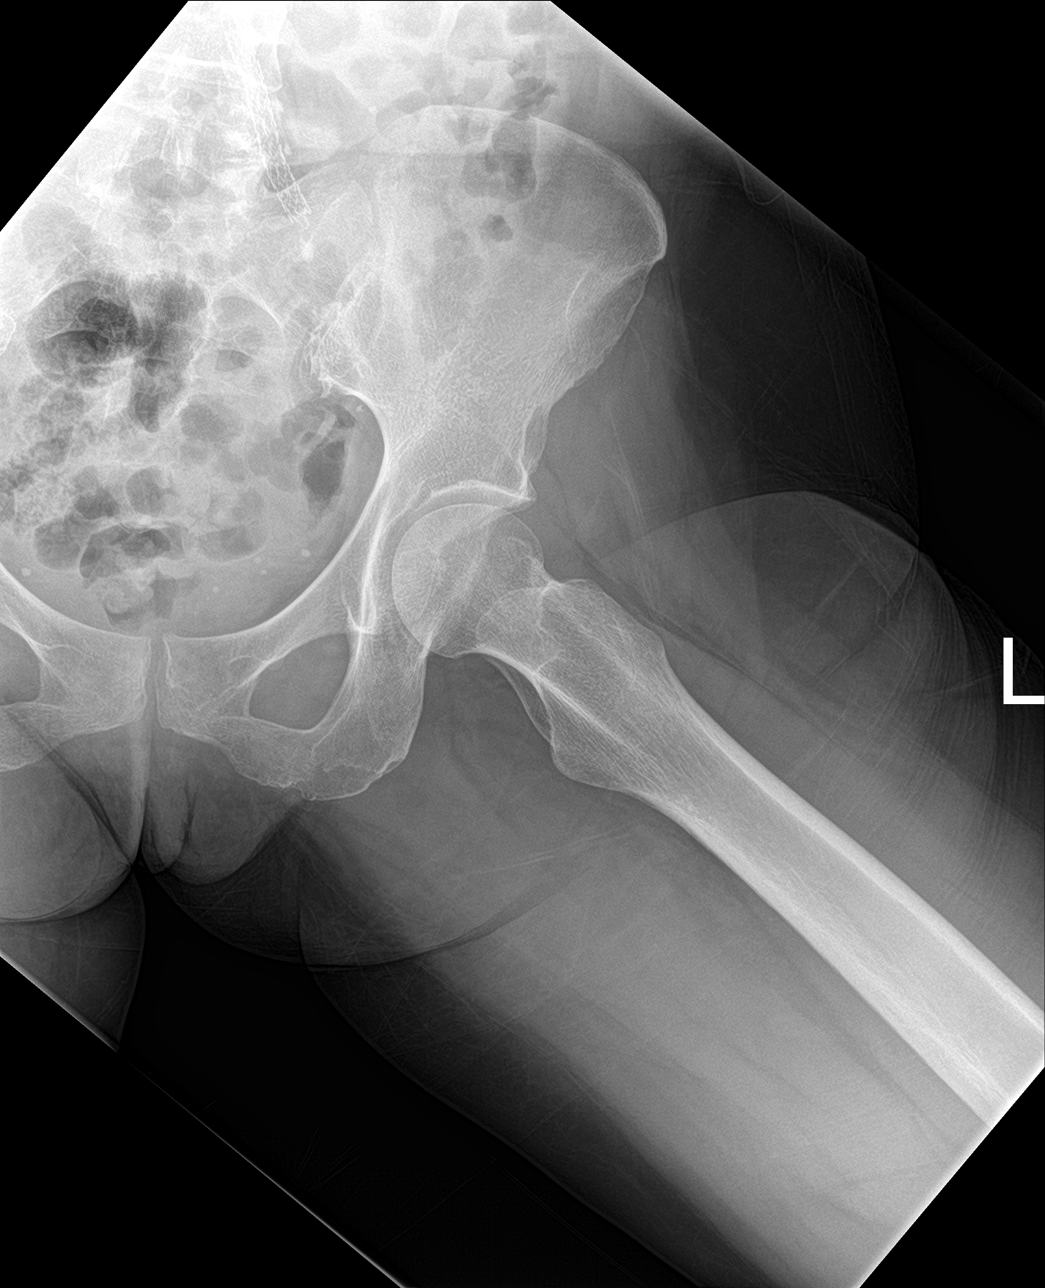

[3 of 3 positions shown; findings below may reference images not displayed]

FINDINGS: AP view of the bony pelvis and left hip demonstrate no acute
displaced fractures. Bony pelvic ring appears intact. Left femoral
head is properly located. Mild joint space narrowing, subchondral
sclerosis and osteophyte formation in both hip joints, indicative of
osteoarthritis. Vascular stent projecting over the expected location
of the left common iliac artery. Numerous pelvic phleboliths are
incidentally noted.
IMPRESSION: 1. No acute radiographic abnormality of the bony pelvis or the left
hip.
2. Mild bilateral hip joint osteoarthritis.

## 2023-11-22 ENCOUNTER — Other Ambulatory Visit

## 2023-12-04 ENCOUNTER — Ambulatory Visit (INDEPENDENT_AMBULATORY_CARE_PROVIDER_SITE_OTHER): Admitting: Physician Assistant

## 2023-12-04 ENCOUNTER — Ambulatory Visit: Payer: Self-pay | Admitting: Physician Assistant

## 2023-12-04 ENCOUNTER — Ambulatory Visit
Admission: RE | Admit: 2023-12-04 | Discharge: 2023-12-04 | Disposition: A | Discharge status: Home / Self Care | Attending: Physician Assistant | Admitting: Physician Assistant

## 2023-12-04 ENCOUNTER — Encounter: Payer: Self-pay | Admitting: Physician Assistant

## 2023-12-04 ENCOUNTER — Ambulatory Visit
Admission: RE | Admit: 2023-12-04 | Discharge: 2023-12-04 | Disposition: A | Discharge status: Home / Self Care | Source: Ambulatory Visit | Attending: Physician Assistant | Admitting: Physician Assistant

## 2023-12-04 VITALS — BP 135/77 | HR 69 | Temp 99.1°F | Ht 64.5 in | Wt 125.7 lb

## 2023-12-04 DIAGNOSIS — R062 Wheezing: Secondary | ICD-10-CM | POA: Diagnosis not present

## 2023-12-04 DIAGNOSIS — R0981 Nasal congestion: Secondary | ICD-10-CM | POA: Diagnosis not present

## 2023-12-04 DIAGNOSIS — R059 Cough, unspecified: Secondary | ICD-10-CM | POA: Diagnosis not present

## 2023-12-04 DIAGNOSIS — R0989 Other specified symptoms and signs involving the circulatory and respiratory systems: Secondary | ICD-10-CM | POA: Diagnosis not present

## 2023-12-04 DIAGNOSIS — R051 Acute cough: Secondary | ICD-10-CM

## 2023-12-04 MED ORDER — ALBUTEROL SULFATE HFA 108 (90 BASE) MCG/ACT IN AERS: 2.0000 | INHALATION_SPRAY | Freq: Four times a day (QID) | RESPIRATORY_TRACT | 0 refills | Status: AC | PRN

## 2023-12-04 MED ORDER — AZITHROMYCIN 250 MG PO TABS: ORAL_TABLET | ORAL | 0 refills | Status: AC

## 2023-12-04 MED ORDER — PREDNISONE 20 MG PO TABS: 20.0000 mg | ORAL_TABLET | Freq: Every day | ORAL | 0 refills | Status: AC

## 2023-12-04 NOTE — Progress Notes (Signed)
 There are no active cardiopulmonary disease. I will call in prednisone  and antibiotic. RTC if symptoms persist

## 2023-12-04 NOTE — Progress Notes (Signed)
 Established patient visit  Patient: Patricia Decker   DOB: 15-Jan-1956   68 y.o. Female  MRN: [id number] Visit Date: 12/04/2023  Today's healthcare provider: Jolynn Spencer, PA-C   Chief Complaint  Patient presents with   Cough    Patient reports that cough is better. Reports had some wheezing.  She is having nasal congestion and some chest congestion. Frequency: Friday afternoon. Patient took OTC Tylenol  Decongestant.  Denies: Fever, sore throat, body aches, chills, loss of appetite.   Subjective      Discussed the use of AI scribe software for clinical note transcription with the patient, who gave verbal consent to proceed.  History of Present Illness Patricia Decker is a 68 year old female who presents with cough, wheezing, and nasal congestion.  She has experienced these symptoms for four days. The cough has slightly improved, but she continues to have a runny nose and congestion, especially when lying down. Nasal discharge is clear, and congestion worsens when bending over. She has no fever or shortness of breath. She was exposed to a friend's husband with a cold, who tested negative for COVID-19 and flu. She denies asthma or COPD and does not smoke. There is no pain or pressure associated with her symptoms. She recalls a previous episode treated with antibiotics for similar symptoms.       09/06/2023    8:55 AM 06/14/2023    8:53 AM 09/05/2022    8:50 AM  Depression screen PHQ 2/9  Decreased Interest 0 0 0  Down, Depressed, Hopeless 0 0 0  PHQ - 2 Score 0 0 0  Altered sleeping 0    Tired, decreased energy 0    Change in appetite 0    Feeling bad or failure about yourself  0    Trouble concentrating 0    Moving slowly or fidgety/restless 0    Suicidal thoughts 0    PHQ-9 Score 0    Difficult doing work/chores Not difficult at all        06/14/2023    8:53 AM  GAD 7 : Generalized Anxiety Score  Nervous, Anxious, on Edge 0  Control/stop worrying 0  Worry too much -  different things 0  Trouble relaxing 0  Restless 0  Easily annoyed or irritable 0  Afraid - awful might happen 0  Total GAD 7 Score 0  Anxiety Difficulty Not difficult at all    Medications: Outpatient Medications Prior to Visit  Medication Sig   aspirin  EC 81 MG tablet Take 1 tablet (81 mg total) by mouth daily.   atorvastatin  (LIPITOR) 10 MG tablet TAKE 1 TABLET BY MOUTH ONCE EVERY EVENING   clopidogrel  (PLAVIX ) 75 MG tablet TAKE 1 TABLET BY MOUTH ONCE DAILY   No facility-administered medications prior to visit.    Review of Systems All negative Except see HPI       Objective    BP 135/77 (BP Location: Left Arm, Patient Position: Sitting, Cuff Size: Normal)   Pulse 69   Temp 99.1 F (37.3 C) (Oral)   Ht 5' 4.5 (1.638 m)   Wt 125 lb 11.2 oz (57 kg)   SpO2 100%   BMI 21.24 kg/m     Physical Exam Vitals reviewed.  Constitutional:      Appearance: She is normal weight.  HENT:     Head: Normocephalic and atraumatic.     Right Ear: Ear canal and external ear normal.     Left Ear: Ear canal and external  ear normal.     Nose: Congestion and rhinorrhea present.     Mouth/Throat:     Pharynx: Posterior oropharyngeal erythema present.     Comments: Postnasal drainage noted Eyes:     General: No scleral icterus.       Right eye: No discharge.        Left eye: No discharge.     Extraocular Movements: Extraocular movements intact.     Pupils: Pupils are equal, round, and reactive to light.  Cardiovascular:     Rate and Rhythm: Normal rate and regular rhythm.  Pulmonary:     Effort: Pulmonary effort is normal.     Breath sounds: Normal breath sounds.  Abdominal:     General: Abdomen is flat. Bowel sounds are normal.     Palpations: Abdomen is soft.  Lymphadenopathy:     Cervical: No cervical adenopathy.  Neurological:     Mental Status: She is alert.      No results found for any visits on 12/04/23.      Assessment & Plan Acute cough with wheezing and  abnormal lung sounds Cough with wheezing and abnormal lung sounds for four days, possible bronchitis or pneumonia or reactive airway disease. No fever or shortness of breath. Wheezing noted, especially when lying down. No asthma or smoking history. - Order chest X-ray stat to evaluate lung condition. - Prescribe Mucinex  for cough management. - Prescribe albuterol  inhaler if Mucinex  is not effective. Could prescribe azithromycin /Z-Pak and prednisone  20 mg twice daily for 5 days Will follow-up  Acute nasal congestion and rhinorrhea Nasal congestion and rhinorrhea for four days with clear nasal discharge, suggestive of upper respiratory infection. - Recommend nasal saline rinse or spray. - Prescribe Flonase  twice daily for one week, then decrease to once daily until symptoms subside. - Advise use of hot tea with honey for symptomatic relief, over-the-counter antihistamines if can tolerate, drink plenty of water Will follow-up   Abnormal lung sounds - DG Chest 2 View - albuterol  (VENTOLIN  HFA) 108 (90 Base) MCG/ACT inhaler; Inhale 2 puffs into the lungs every 6 (six) hours as needed for wheezing or shortness of breath.  Dispense: 8 g; Refill: 0  Orders Placed This Encounter  Procedures   DG Chest 2 View    Reason for Exam (SYMPTOM  OR DIAGNOSIS REQUIRED):   abnormal lung sounds, cough    Preferred imaging location?:   [facility]    No follow-ups on file.   The patient was advised to call back or seek an in-person evaluation if the symptoms worsen or if the condition fails to improve as anticipated.  I discussed the assessment and treatment plan with the patient. The patient was provided an opportunity to ask questions and all were answered. The patient agreed with the plan and demonstrated an understanding of the instructions.  I, Maddi Collar, PA-C have reviewed all documentation for this visit. The documentation on 12/04/2023  for the exam, diagnosis, procedures, and orders are all  accurate and complete.  Jolynn Spencer, [facility], MMS [facility] [phone] (phone) [phone] (fax)  [facility]

## 2023-12-25 ENCOUNTER — Other Ambulatory Visit (INDEPENDENT_AMBULATORY_CARE_PROVIDER_SITE_OTHER): Payer: Self-pay | Admitting: Nurse Practitioner

## 2024-01-30 ENCOUNTER — Encounter (INDEPENDENT_AMBULATORY_CARE_PROVIDER_SITE_OTHER): Payer: Medicare Other

## 2024-01-30 ENCOUNTER — Ambulatory Visit (INDEPENDENT_AMBULATORY_CARE_PROVIDER_SITE_OTHER): Payer: Medicare Other | Admitting: Vascular Surgery

## 2024-02-02 ENCOUNTER — Telehealth: Payer: Self-pay

## 2024-03-06 ENCOUNTER — Other Ambulatory Visit (INDEPENDENT_AMBULATORY_CARE_PROVIDER_SITE_OTHER): Payer: Self-pay | Admitting: Vascular Surgery

## 2024-03-06 DIAGNOSIS — I739 Peripheral vascular disease, unspecified: Secondary | ICD-10-CM

## 2024-03-08 ENCOUNTER — Encounter (INDEPENDENT_AMBULATORY_CARE_PROVIDER_SITE_OTHER)

## 2024-03-08 ENCOUNTER — Ambulatory Visit (INDEPENDENT_AMBULATORY_CARE_PROVIDER_SITE_OTHER): Admitting: Vascular Surgery

## 2024-03-15 ENCOUNTER — Encounter (INDEPENDENT_AMBULATORY_CARE_PROVIDER_SITE_OTHER): Payer: Self-pay | Admitting: Vascular Surgery

## 2024-03-15 ENCOUNTER — Ambulatory Visit (INDEPENDENT_AMBULATORY_CARE_PROVIDER_SITE_OTHER)

## 2024-03-15 ENCOUNTER — Ambulatory Visit (INDEPENDENT_AMBULATORY_CARE_PROVIDER_SITE_OTHER): Admitting: Vascular Surgery

## 2024-03-15 VITALS — BP 155/80 | HR 63 | Resp 18 | Wt 129.0 lb

## 2024-03-15 DIAGNOSIS — E7800 Pure hypercholesterolemia, unspecified: Secondary | ICD-10-CM | POA: Diagnosis not present

## 2024-03-15 DIAGNOSIS — I739 Peripheral vascular disease, unspecified: Secondary | ICD-10-CM

## 2024-03-15 NOTE — Progress Notes (Signed)
 "   MRN : [id number]  Patricia Decker is a 69 y.o. ([birth year]) female who presents with chief complaint of  Chief Complaint  Patient presents with   Follow-up    25yr abi follow up  .  History of Present Illness:   Discussed the use of AI scribe software for clinical note transcription with the patient, who gave verbal consent to proceed.  History of Present Illness Patricia Decker is a 68 year old female with peripheral artery disease, status post iliac vascular stents, who presents for routine annual vascular surgery follow-up.  She reports intermittent lower extremity cramping that has not worsened and no new leg symptoms. She cannot sleep on one side because of irritation she attributes to nerve discomfort and occasionally has to get up at night.  She is satisfied with the stent outcome and has had no significant complications since. Toadys non-invasive studies showed the waveforms and pressures were normal.  She remains active and walks regularly outside her house for exercise, which she feels helps her circulation and conditioning.    Results   Current Outpatient Medications  Medication Sig Dispense Refill   aspirin  EC 81 MG tablet Take 1 tablet (81 mg total) by mouth daily. 150 tablet 2   atorvastatin  (LIPITOR) 10 MG tablet TAKE 1 TABLET BY MOUTH ONCE EVERY EVENING 30 tablet 11   clopidogrel  (PLAVIX ) 75 MG tablet TAKE 1 TABLET BY MOUTH ONCE DAILY 30 tablet 11   albuterol  (VENTOLIN  HFA) 108 (90 Base) MCG/ACT inhaler Inhale 2 puffs into the lungs every 6 (six) hours as needed for wheezing or shortness of breath. (Patient not taking: Reported on 03/15/2024) 8 g 0   predniSONE  (DELTASONE ) 20 MG tablet Take 1 tablet (20 mg total) by mouth daily with breakfast. (Patient not taking: Reported on 03/15/2024) 10 tablet 0   No current facility-administered medications for this visit.    Past Medical History:  Diagnosis Date   Anemia    Anxiety    Arthritis    Asthma    Cataract     removed   Depression    Wears dentures    Full upper, partial lower    Past Surgical History:  Procedure Laterality Date   ABDOMINAL HYSTERECTOMY     APPENDECTOMY     CATARACT EXTRACTION, BILATERAL     CHOLECYSTECTOMY     COLONOSCOPY WITH PROPOFOL  N/A 06/30/2016   Procedure: COLONOSCOPY WITH PROPOFOL ;  Surgeon: Ruel Kung, MD;  Location: [facility] ENDOSCOPY;  Service: Endoscopy;  Laterality: N/A;   EAR CYST EXCISION Left 07/31/2019   Procedure: EXCISION EAR LESION;  Surgeon: Milissa Hamming, MD;  Location: [facility];  Service: ENT;  Laterality: Left;  Latex   LOWER EXTREMITY ANGIOGRAPHY Left 04/02/2021   Procedure: LOWER EXTREMITY ANGIOGRAPHY;  Surgeon: Marea Selinda RAMAN, MD;  Location: [facility] IN[facility]ASIVE [facility];  Service: Cardiovascular;  Laterality: Left;     Social History[1]    Family History  Problem Relation Age of Onset   Alzheimer's disease Sister    Breast cancer Sister 97     Allergies[2]   REVIEW OF SYSTEMS (Negative unless checked)   Constitutional: [] Weight loss  [] Fever  [] Chills Cardiac: [] Chest pain   [] Chest pressure   [] Palpitations   [] Shortness of breath when laying flat   [] Shortness of breath at rest   [] Shortness of breath with exertion. Vascular:  [x] Pain in legs with walking   [] Pain in legs at rest   [] Pain in legs when laying flat   [  x]Claudication   [] Pain in feet when walking  [] Pain in feet at rest  [] Pain in feet when laying flat   [] History of DVT   [] Phlebitis   [] Swelling in legs   [] Varicose veins   [] Non-healing ulcers Pulmonary:   [] Uses home oxygen   [] Productive cough   [] Hemoptysis   [] Wheeze  [] COPD   [x] Asthma Neurologic:  [] Dizziness  [] Blackouts   [] Seizures   [] History of stroke   [] History of TIA  [] Aphasia   [] Temporary blindness   [] Dysphagia   [] Weakness or numbness in arms   [] Weakness or numbness in legs Musculoskeletal:  [x] Arthritis   [] Joint swelling   [] Joint pain   [] Low back pain Hematologic:  [] Easy bruising  [] Easy  bleeding   [] Hypercoagulable state   [x] Anemic   Gastrointestinal:  [] Blood in stool   [] Vomiting blood  [] Gastroesophageal reflux/heartburn   [] Abdominal pain Genitourinary:  [] Chronic kidney disease   [] Difficult urination  [] Frequent urination  [] Burning with urination   [] Hematuria Skin:  [] Rashes   [] Ulcers   [] Wounds Psychological:  [x] History of anxiety   [x]  History of major depression.  Physical Examination  BP (!) 155/80 (BP Location: Right Arm)   Pulse 63   Resp 18   Wt 129 lb (58.5 kg)   BMI 21.80 kg/m  Gen:  WD/WN, NAD Head: [place]/AT, No temporalis wasting. Ear/Nose/Throat: Hearing grossly intact, nares w/o erythema or drainage Eyes: Conjunctiva clear. Sclera non-icteric Neck: Supple.  Trachea midline Pulmonary:  Good air movement, no use of accessory muscles.  Cardiac: RRR, no JVD Vascular:  Vessel Right Left  Radial Palpable Palpable                          PT Palpable Palpable  DP Palpable Palpable   Gastrointestinal: soft, non-tender/non-distended. No guarding/reflex.  Musculoskeletal: M/S 5/5 throughout.  No deformity or atrophy. No edema. Neurologic: Sensation grossly intact in extremities.  Symmetrical.  Speech is fluent.  Psychiatric: Judgment intact, Mood & affect appropriate for pt's clinical situation. Dermatologic: No rashes or ulcers noted.  No cellulitis or open wounds.  Physical Exam     Labs No results found for this or any previous visit (from the past 2160 hours).  Radiology No results found.  Assessment/Plan Assessment & Plan Peripheral artery disease Peripheral artery disease well-managed post lower extremity stenting with normal perfusion, waveforms, and pressures. Occasional cramping likely due to nerve irritation, not vascular insufficiency. No acute limb ischemia or disease progression. - Scheduled annual vascular follow-up to assess stent patency and peripheral circulation. Continue aspirin  and Plavix  - Encouraged regular  ambulation and physical activity. - Instructed her to contact the office for any new or worsening symptoms.  Hypercholesterolemia lipid control important in reducing the progression of atherosclerotic disease. Continue statin therapy    Selinda Gu, MD  03/15/2024 8:36 AM    This note was created with Dragon medical transcription system.  Any errors from dictation are purely unintentional     [1]  Social History Tobacco Use   Smoking status: Never   Smokeless tobacco: Never  Vaping Use   Vaping status: Never Used  Substance Use Topics   Alcohol use: No   Drug use: No  [2]  Allergies Allergen Reactions   Penicillins Other (See Comments)    Other Reaction(s): Not available   Sulfa Antibiotics     Indigestion   Latex Rash    Gloves irritate hands when worn   "

## 2024-03-18 LAB — VAS US ABI WITH/WO TBI
Left ABI: 1.08
Right ABI: 1.08

## 2024-03-20 ENCOUNTER — Other Ambulatory Visit: Payer: Self-pay | Admitting: Family Medicine

## 2024-03-20 ENCOUNTER — Encounter: Payer: Self-pay | Admitting: Family Medicine

## 2024-03-20 DIAGNOSIS — Z1231 Encounter for screening mammogram for malignant neoplasm of breast: Secondary | ICD-10-CM

## 2024-05-01 ENCOUNTER — Encounter

## 2024-06-14 ENCOUNTER — Encounter: Admitting: Family Medicine

## 2024-06-21 ENCOUNTER — Encounter: Admitting: Family Medicine

## 2024-09-10 ENCOUNTER — Ambulatory Visit

## 2025-03-18 ENCOUNTER — Ambulatory Visit (INDEPENDENT_AMBULATORY_CARE_PROVIDER_SITE_OTHER): Admitting: Vascular Surgery

## 2025-03-18 ENCOUNTER — Encounter (INDEPENDENT_AMBULATORY_CARE_PROVIDER_SITE_OTHER)
